# Patient Record
Sex: Male | Born: 1960 | Race: White | Hispanic: No | Marital: Married | Smoking: Former smoker
Health system: Southern US, Community
[De-identification: ages and names within clinical notes are randomized; demographics above are authoritative.]

## PROBLEM LIST (undated history)

## (undated) DIAGNOSIS — M1990 Unspecified osteoarthritis, unspecified site: Secondary | ICD-10-CM

## (undated) DIAGNOSIS — T7840XA Allergy, unspecified, initial encounter: Secondary | ICD-10-CM

## (undated) DIAGNOSIS — E559 Vitamin D deficiency, unspecified: Secondary | ICD-10-CM

## (undated) DIAGNOSIS — F32A Depression, unspecified: Secondary | ICD-10-CM

## (undated) DIAGNOSIS — E291 Testicular hypofunction: Secondary | ICD-10-CM

## (undated) DIAGNOSIS — C4491 Basal cell carcinoma of skin, unspecified: Secondary | ICD-10-CM

## (undated) DIAGNOSIS — J45909 Unspecified asthma, uncomplicated: Secondary | ICD-10-CM

## (undated) DIAGNOSIS — M316 Other giant cell arteritis: Secondary | ICD-10-CM

## (undated) DIAGNOSIS — F329 Major depressive disorder, single episode, unspecified: Secondary | ICD-10-CM

## (undated) DIAGNOSIS — E785 Hyperlipidemia, unspecified: Secondary | ICD-10-CM

## (undated) DIAGNOSIS — F419 Anxiety disorder, unspecified: Secondary | ICD-10-CM

## (undated) DIAGNOSIS — I10 Essential (primary) hypertension: Secondary | ICD-10-CM

## (undated) DIAGNOSIS — N529 Male erectile dysfunction, unspecified: Secondary | ICD-10-CM

## (undated) DIAGNOSIS — K219 Gastro-esophageal reflux disease without esophagitis: Secondary | ICD-10-CM

## (undated) HISTORY — PX: ORTHOPEDIC SURGERY: SHX850

## (undated) HISTORY — DX: Vitamin D deficiency, unspecified: E55.9

## (undated) HISTORY — DX: Essential (primary) hypertension: I10

## (undated) HISTORY — DX: Major depressive disorder, single episode, unspecified: F32.9

## (undated) HISTORY — DX: Unspecified asthma, uncomplicated: J45.909

## (undated) HISTORY — PX: OTHER SURGICAL HISTORY: SHX169

## (undated) HISTORY — DX: Depression, unspecified: F32.A

## (undated) HISTORY — DX: Anxiety disorder, unspecified: F41.9

## (undated) HISTORY — DX: Unspecified osteoarthritis, unspecified site: M19.90

## (undated) HISTORY — DX: Allergy, unspecified, initial encounter: T78.40XA

## (undated) HISTORY — DX: Basal cell carcinoma of skin, unspecified: C44.91

## (undated) HISTORY — DX: Testicular hypofunction: E29.1

## (undated) HISTORY — DX: Gastro-esophageal reflux disease without esophagitis: K21.9

## (undated) HISTORY — PX: LASIK: SHX215

## (undated) HISTORY — DX: Other giant cell arteritis: M31.6

## (undated) HISTORY — PX: HERNIA REPAIR: SHX51

## (undated) HISTORY — DX: Hyperlipidemia, unspecified: E78.5

## (undated) HISTORY — DX: Male erectile dysfunction, unspecified: N52.9

---
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
## 2002-10-14 ENCOUNTER — Encounter: Payer: Self-pay | Admitting: Family Medicine

## 2002-10-14 ENCOUNTER — Encounter: Admission: RE | Admit: 2002-10-14 | Discharge: 2002-10-14 | Payer: Self-pay | Admitting: Family Medicine

## 2007-01-11 ENCOUNTER — Encounter: Admission: RE | Admit: 2007-01-11 | Discharge: 2007-01-11 | Payer: Self-pay | Admitting: Emergency Medicine

## 2007-01-11 IMAGING — US TESTIS
1 series · 14 of 16 positions shown · non-contrast
Comparison: None.

CLINICAL DATA: Left testicular pain for three days.
 SCROTAL ULTRASOUND:
 DOPPLER ULTRASOUND OF THE TESTICLES:
TECHNIQUE: Complete ultrasound examination of the testicles, epididymis, and other scrotal structures was performed.  Color and duplex Doppler ultrasound was utilized to evaluate blood flow to the testicles.

[Series 1: testis · 0.08mm/px · 14 of 56 slices shown]
[im 1/56]
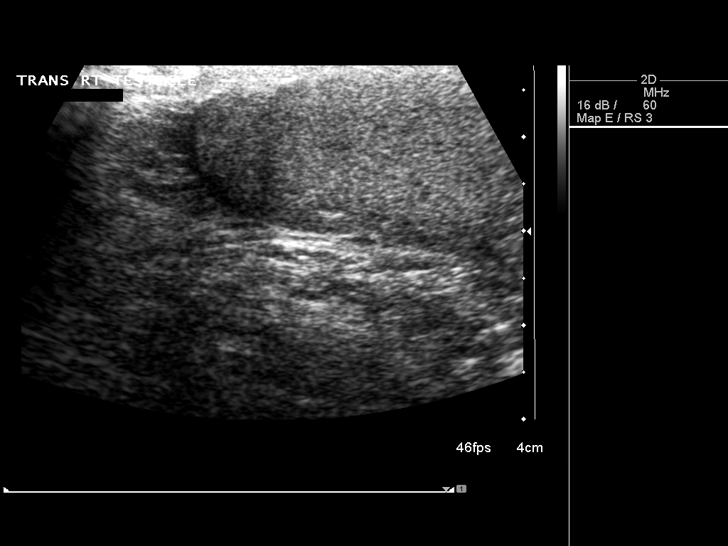
[im 4/56]
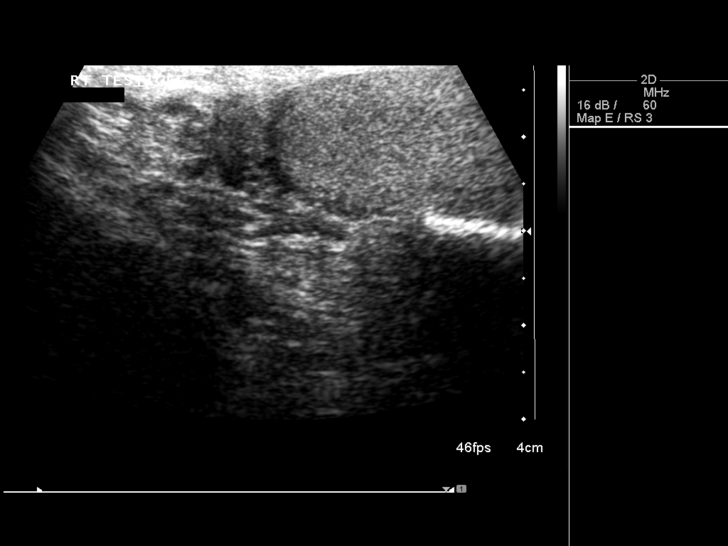
[im 8/56]
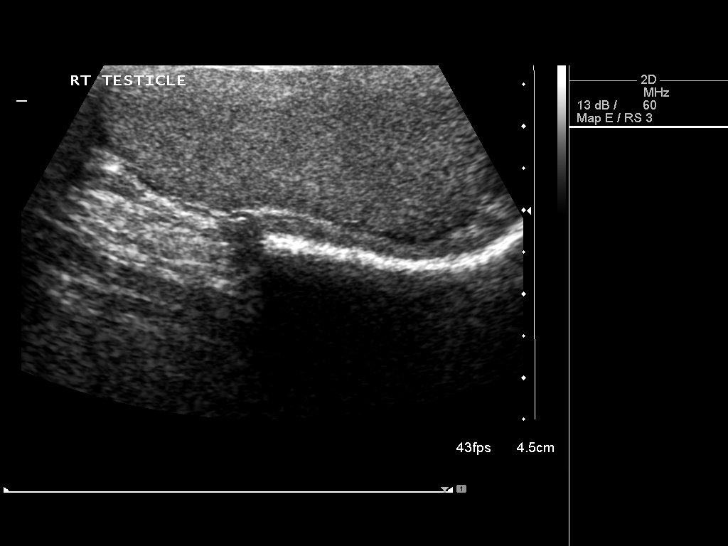
[im 15/56]
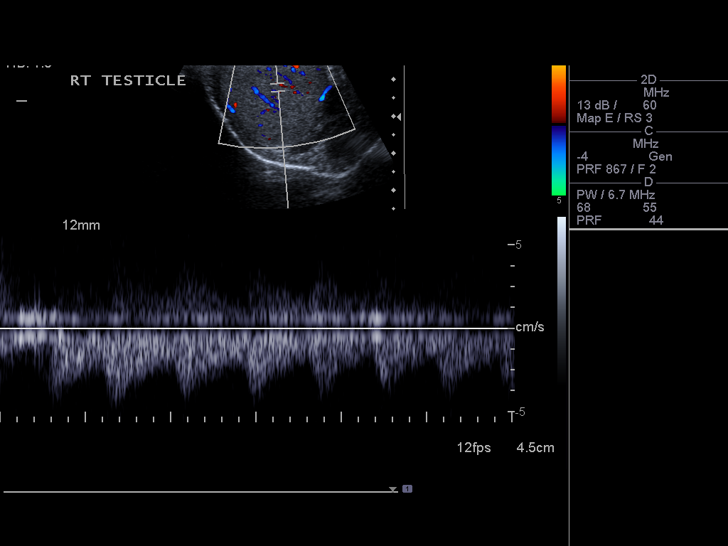
[im 19/56]
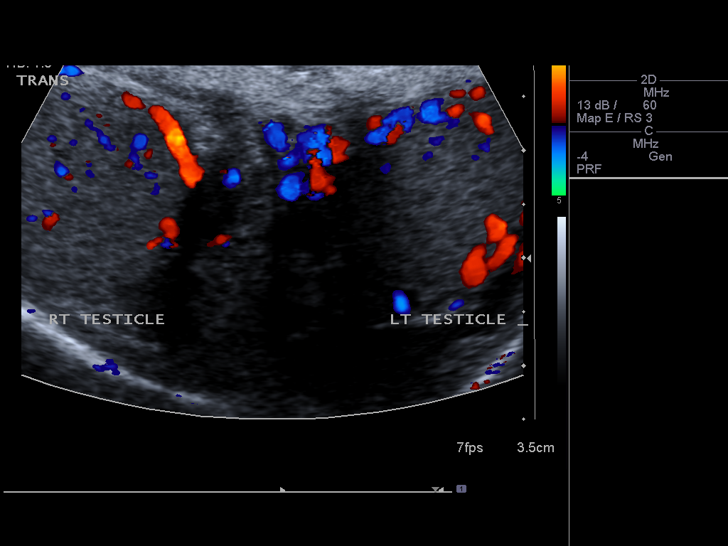
[im 23/56]
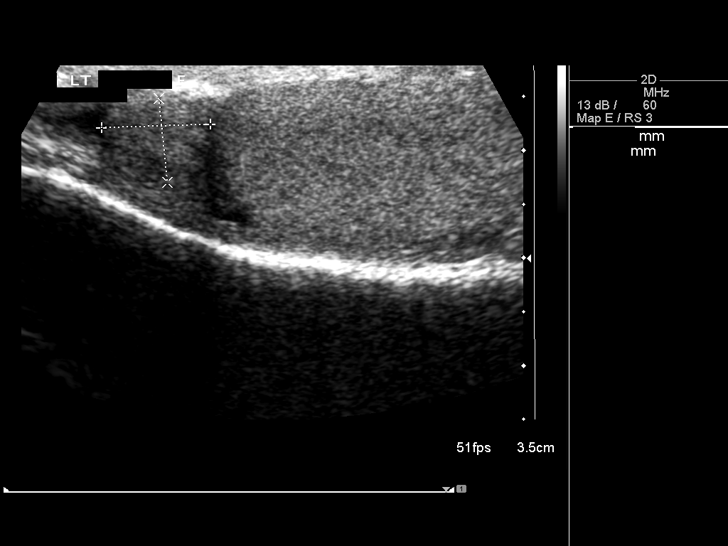
[im 26/56]
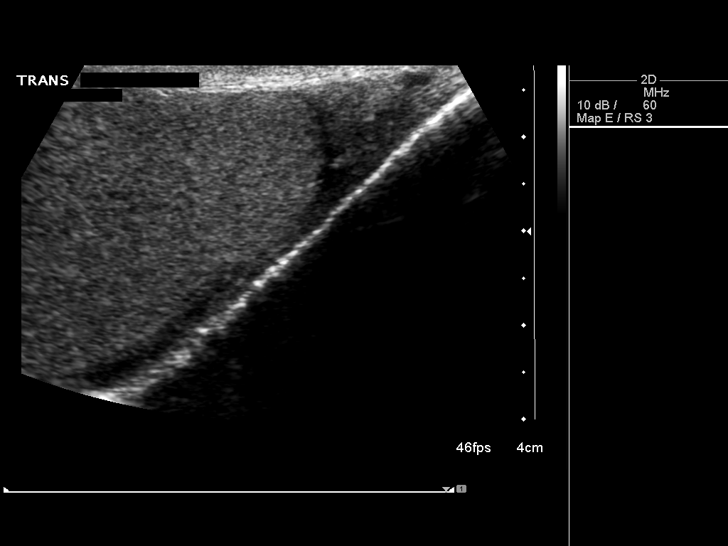
[im 30/56]
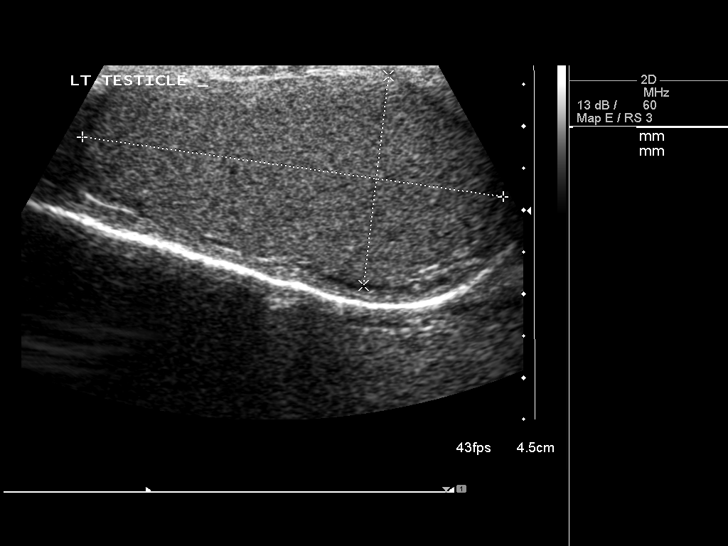
[im 34/56]
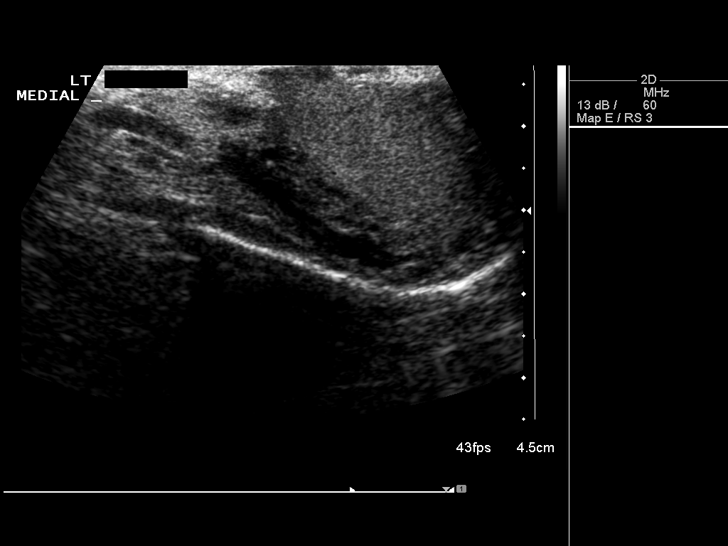
[im 37/56]
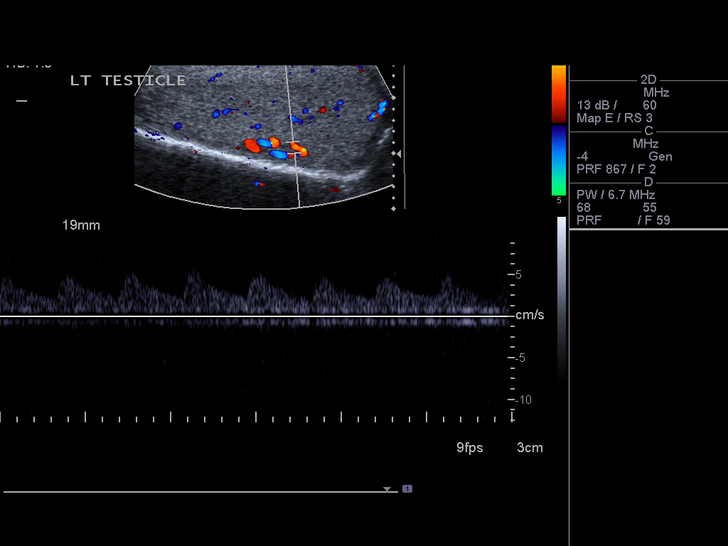
[im 45/56]
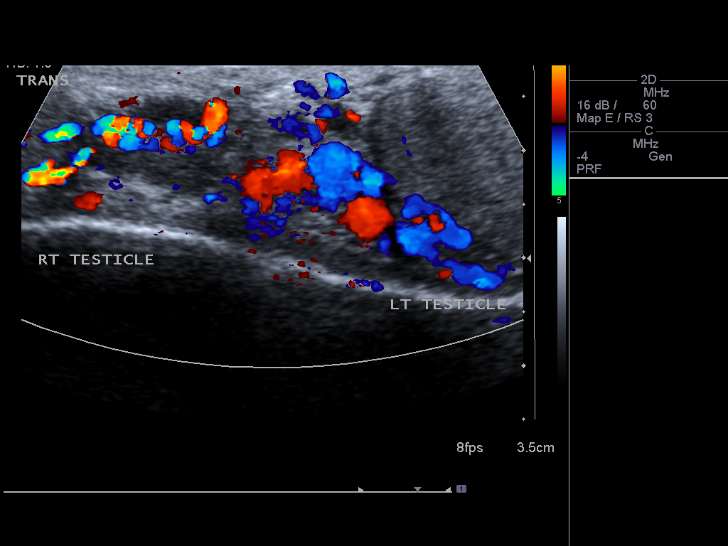
[im 48/56]
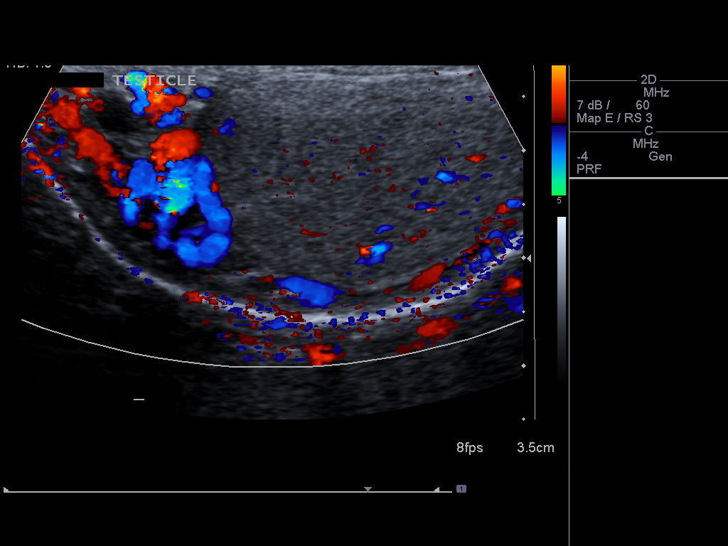
[im 52/56]
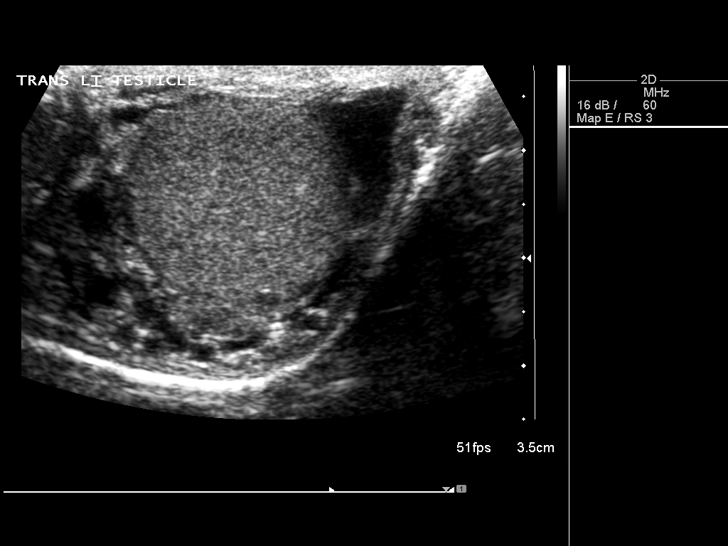
[im 56/56]
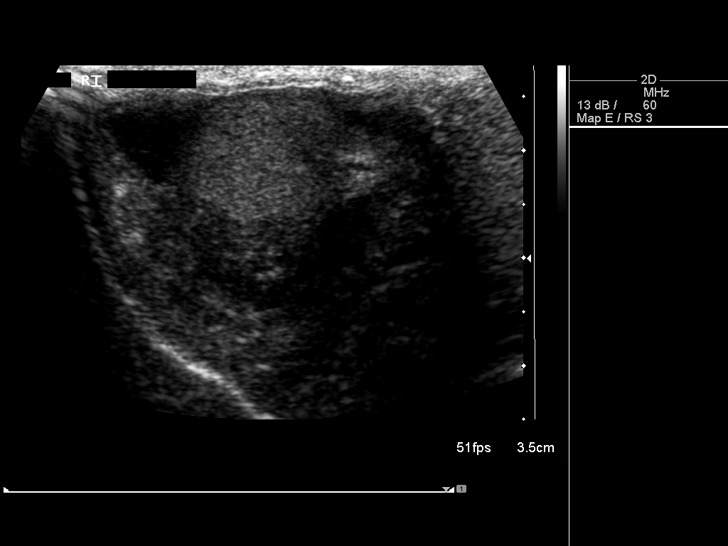

[14 of 16 positions shown; findings below may reference images not displayed]

FINDINGS: Right testicle 4.6 x 2.2 x 3.2 cm.  Left testicle 5.1 x 2.5 x 3.5 cm.  No focal testicular masses. 
 Color flow Doppler and arterial and venous waveforms are symmetrical.  
 No evidence for epididymitis or epididymal masses.  There is a small amount of bilateral testicular fluid.  
 There is a prominent varicocele on the left.  I would question if this might be causing the testicular pain.
IMPRESSION: 1.  Prominent left varicocele.
 2.  Small amount of bilateral scrotal fluid. 
 3.  Testicles and epididymi normal.

## 2007-01-15 ENCOUNTER — Encounter: Admission: RE | Admit: 2007-01-15 | Discharge: 2007-01-15 | Payer: Self-pay | Admitting: Emergency Medicine

## 2010-12-30 ENCOUNTER — Ambulatory Visit (INDEPENDENT_AMBULATORY_CARE_PROVIDER_SITE_OTHER): Payer: PRIVATE HEALTH INSURANCE | Admitting: Sports Medicine

## 2010-12-30 ENCOUNTER — Encounter: Payer: Self-pay | Admitting: Sports Medicine

## 2010-12-30 VITALS — BP 150/93 | HR 91 | Ht 71.5 in | Wt 190.0 lb

## 2010-12-30 DIAGNOSIS — M7740 Metatarsalgia, unspecified foot: Secondary | ICD-10-CM

## 2010-12-30 DIAGNOSIS — M948X9 Other specified disorders of cartilage, unspecified sites: Secondary | ICD-10-CM

## 2010-12-30 DIAGNOSIS — M7750 Other enthesopathy of unspecified foot: Secondary | ICD-10-CM

## 2010-12-30 DIAGNOSIS — M79609 Pain in unspecified limb: Secondary | ICD-10-CM

## 2010-12-30 DIAGNOSIS — M79673 Pain in unspecified foot: Secondary | ICD-10-CM

## 2010-12-30 DIAGNOSIS — M2580 Other specified joint disorders, unspecified joint: Secondary | ICD-10-CM

## 2010-12-30 NOTE — Progress Notes (Signed)
  Subjective:    Patient ID: Stephen Campbell, male    DOB: Dec 17, 1960, 50 y.o.   MRN: [id number]  HPI  Pt presents to clinic for evaluation of rt foot itching at arch x 5-6 years ago.  Also has bilat foot pain x 1.5 years- Rt foot pain at 1st MT and Lt foot pain at 2nd toe.  Pain is worse with running.  Got orthotics made by [employer] which have been helpful.  Had decreased running due to foot pain until the past month- has been able to run and walk without significant pain.  Does not take any meds for the pain.  Works as a Education officer, community.       Wife is a Adult nurse. She helped in place and metatarsal pads on his insoles and these have decreased the pain under his first toes. The orthotics that he originally had made have a hard steel plate onto the forefoot and there are no longer comfortable   Review of Systems     Objective:   Physical Exam No acute distress    Morton's foot bilat Moderate pronation bilat with medial deviation of great toe Rear foot is neutral bilat Good post tib function, normal calf definition bilat Leg lengths equal FABER neg bilat Good hip abduction strength good bilat Good hip flexion strength bilat Rt foot exam: Slight tenderness in tarsal tunnel No hallux rigidus or abnormal callusing Tenderness over sesamoids Lt foot exam: Tenderness under sesamoids on lt  MSK ultrasound Sesamoids are visualized under the right great toe and show no abnormal swelling or sign of stress fracture Sesamoids under the left great toe are also unremarkable MTP joints on the left second and third are unremarkable there is no sign of abnormality in the metatarsal shafts  Right medial ankle shows a normal posterior tibialis tendon and normal flexor digitorum tendon There is a small spur on the posterior aspect of the right medial malleolus that has a little hypoechoic change surrounding There is some slight increase in fluid in the inferior tarsal tunnel around the  posterior tibialis tendon   Assessment & Plan:

## 2010-12-30 NOTE — Assessment & Plan Note (Signed)
B. possible tarsal tunnel involvement I suggested starting vitamin B6 100 mg daily Begin some toe ups on the step with inversion torque the posterior tibialis tendon Use good arch support in shoes and today he is given a sports insole that has some arch build up  If this works he should continue it if not we could recheck this in 6-8 weeks

## 2010-12-30 NOTE — Assessment & Plan Note (Signed)
I think this was triggered by the loss of his transverse arch Metatarsal pads should help with this However the metal plates in his old orthotics made gradually started aggravating this as well

## 2010-12-30 NOTE — Assessment & Plan Note (Signed)
Metatarsal pads are noted to both insoles These are comfortable with walking and jogging gait His running form is neutral with a mid foot strike

## 2011-06-17 HISTORY — PX: VASECTOMY: SHX75

## 2012-01-06 ENCOUNTER — Ambulatory Visit (INDEPENDENT_AMBULATORY_CARE_PROVIDER_SITE_OTHER): Payer: 59 | Admitting: Sports Medicine

## 2012-01-06 ENCOUNTER — Encounter: Payer: Self-pay | Admitting: Sports Medicine

## 2012-01-06 VITALS — BP 144/93

## 2012-01-06 DIAGNOSIS — M722 Plantar fascial fibromatosis: Secondary | ICD-10-CM

## 2012-01-06 DIAGNOSIS — M79673 Pain in unspecified foot: Secondary | ICD-10-CM

## 2012-01-06 DIAGNOSIS — M79609 Pain in unspecified limb: Secondary | ICD-10-CM

## 2012-01-06 NOTE — Progress Notes (Signed)
  Subjective:    Patient ID: Stephen Campbell, male    DOB: 09/13/1960, 51 y.o.   MRN: [id number]  HPI 51 yo presents for new onset L heel pain. Has been going on about 2 months now. Prior to onset of pain had worked back up to 5-10 miles per week of walking / running. Pain started in mid plantar surface initially but quickly localized to the plantar heel and more specifically the lateral plantar heel. Has been trying supportive shoes with heel inserts without significant improvement. Has also taken off from walking / running for about 2 months with no improvement. Doing some stretching, mostly for calf.  Also f/u for sesamoiditis and metatarsalgia, seen about a year ago. Completely resolved with insoles and metatarsal inserts.  Review of Systems Per HPI.    Objective:   Physical Exam R ankle/foot: Normal ROM and strength. No ligamentous laxity. No TTP. L ankle/foot: Normal ROM and strength. TTP lateral posterior heel. No pain with percussion. Some with calcaneal squeeze.some TTP at insertion of PF medially  In clinic u/s: Plantar fascia thickened in heel area to approx 0.6 cm as compared with achilles at approx 0.44 cm; no evidence of stress reaction in the calcaneus.    Assessment & Plan:  1. Plantar fasciitis.  --Given arch support to wear with activity. Increased heel height and offloaded lateral heel with insole. --Stretching and exercise program given and discussed. --Ice at end of day. --F/u 2 months if no improvement, expected course discussed, ease back into activity.

## 2012-01-06 NOTE — Assessment & Plan Note (Signed)
Has some atypical sxs but [place] is very consistent  We will begin standard stretches and exercises  Use heel support  Try arch strap  Reck 2 mos or so

## 2013-06-02 ENCOUNTER — Encounter: Payer: Self-pay | Admitting: Internal Medicine

## 2013-06-02 DIAGNOSIS — F32A Depression, unspecified: Secondary | ICD-10-CM | POA: Insufficient documentation

## 2013-06-02 DIAGNOSIS — J45909 Unspecified asthma, uncomplicated: Secondary | ICD-10-CM | POA: Insufficient documentation

## 2013-06-02 DIAGNOSIS — F329 Major depressive disorder, single episode, unspecified: Secondary | ICD-10-CM | POA: Insufficient documentation

## 2013-06-02 DIAGNOSIS — K219 Gastro-esophageal reflux disease without esophagitis: Secondary | ICD-10-CM | POA: Insufficient documentation

## 2013-06-02 DIAGNOSIS — N529 Male erectile dysfunction, unspecified: Secondary | ICD-10-CM | POA: Insufficient documentation

## 2013-06-02 DIAGNOSIS — T7840XA Allergy, unspecified, initial encounter: Secondary | ICD-10-CM | POA: Insufficient documentation

## 2013-06-03 ENCOUNTER — Encounter: Payer: Self-pay | Admitting: Physician Assistant

## 2013-06-03 ENCOUNTER — Ambulatory Visit (INDEPENDENT_AMBULATORY_CARE_PROVIDER_SITE_OTHER): Payer: 59 | Admitting: Physician Assistant

## 2013-06-03 VITALS — BP 138/88 | HR 80 | Temp 98.2°F | Resp 16 | Ht 72.5 in | Wt 196.0 lb

## 2013-06-03 DIAGNOSIS — J019 Acute sinusitis, unspecified: Secondary | ICD-10-CM

## 2013-06-03 MED ORDER — PREDNISONE 20 MG PO TABS: ORAL_TABLET | ORAL | Status: DC

## 2013-06-03 MED ORDER — AZITHROMYCIN 250 MG PO TABS: ORAL_TABLET | ORAL | Status: DC

## 2013-06-03 NOTE — Progress Notes (Signed)
   Subjective:    Patient ID: Stephen Campbell, male    DOB: [birth year], 52 y.o.   MRN: [id number]  Cough This is a new problem. Episode onset: Had cold in November, felt better, did yard work at [employer] and started for one week.  The problem has been unchanged. Episode frequency: Worse in the evening. The cough is productive of purulent sputum. Associated symptoms include headaches, nasal congestion, postnasal drip and a sore throat. Pertinent negatives include no chest pain, chills, ear congestion, ear pain, fever, heartburn, hemoptysis, myalgias, rash, rhinorrhea, shortness of breath, sweats, weight loss or wheezing. The symptoms are aggravated by dust, pollens and lying down. He has tried OTC cough suppressant and a beta-agonist inhaler for the symptoms. The treatment provided mild relief. His past medical history is significant for asthma.    Review of Systems  Constitutional: Negative.  Negative for fever, chills and weight loss.  HENT: Positive for postnasal drip and sore throat. Negative for ear pain and rhinorrhea.   Respiratory: Positive for cough. Negative for hemoptysis, chest tightness, shortness of breath, wheezing and stridor.   Cardiovascular: Negative for chest pain.  Gastrointestinal: Negative.  Negative for heartburn.  Musculoskeletal: Negative.  Negative for myalgias.  Skin: Negative for rash.  Neurological: Positive for headaches.      Objective:   Physical Exam  Constitutional: He is oriented to person, place, and time. He appears well-developed and well-nourished.  HENT:  Head: Normocephalic and atraumatic.  Right Ear: External ear normal.  Left Ear: External ear normal.  Nose: Right sinus exhibits frontal sinus tenderness. Left sinus exhibits frontal sinus tenderness.  Mouth/Throat: Oropharynx is clear and moist.  Eyes: Conjunctivae and EOM are normal. Pupils are equal, round, and reactive to light.  Neck: Normal range of motion. Neck supple.  Cardiovascular: Normal  rate, regular rhythm and normal heart sounds.   Pulmonary/Chest: Effort normal and breath sounds normal. He has no wheezes.  Abdominal: Soft. Bowel sounds are normal.  Lymphadenopathy:    He has no cervical adenopathy.  Neurological: He is alert and oriented to person, place, and time.      Assessment & Plan:  Acute sinusitis, unspecified - Plan: azithromycin (ZITHROMAX) 250 MG tablet, predniSONE (DELTASONE) 20 MG tablet

## 2013-06-03 NOTE — Patient Instructions (Signed)

## 2013-06-13 ENCOUNTER — Ambulatory Visit (INDEPENDENT_AMBULATORY_CARE_PROVIDER_SITE_OTHER): Payer: 59 | Admitting: Physician Assistant

## 2013-06-13 ENCOUNTER — Encounter: Payer: Self-pay | Admitting: Physician Assistant

## 2013-06-13 VITALS — BP 122/78 | HR 84 | Temp 97.9°F | Resp 16 | Wt 197.0 lb

## 2013-06-13 DIAGNOSIS — L0231 Cutaneous abscess of buttock: Secondary | ICD-10-CM

## 2013-06-13 MED ORDER — SULFAMETHOXAZOLE-TRIMETHOPRIM 400-80 MG PO TABS: 1.0000 | ORAL_TABLET | Freq: Two times a day (BID) | ORAL | Status: AC

## 2013-06-13 NOTE — Patient Instructions (Signed)
Abscess An abscess is an infected area that contains a collection of pus and debris.It can occur in almost any part of the body. An abscess is also known as a furuncle or boil. CAUSES  An abscess occurs when tissue gets infected. This can occur from blockage of oil or sweat glands, infection of hair follicles, or a minor injury to the skin. As the body tries to fight the infection, pus collects in the area and creates pressure under the skin. This pressure causes pain. People with weakened immune systems have difficulty fighting infections and get certain abscesses more often.  SYMPTOMS Usually an abscess develops on the skin and becomes a painful mass that is red, warm, and tender. If the abscess forms under the skin, you may feel a moveable soft area under the skin. Some abscesses break open (rupture) on their own, but most will continue to get worse without care. The infection can spread deeper into the body and eventually into the bloodstream, causing you to feel ill.  DIAGNOSIS  Your caregiver will take your medical history and perform a physical exam. A sample of fluid may also be taken from the abscess to determine what is causing your infection. TREATMENT  Your caregiver may prescribe antibiotic medicines to fight the infection. However, taking antibiotics alone usually does not cure an abscess. Your caregiver may need to make a small cut (incision) in the abscess to drain the pus. In some cases, gauze is packed into the abscess to reduce pain and to continue draining the area. HOME CARE INSTRUCTIONS   Only take over-the-counter or prescription medicines for pain, discomfort, or fever as directed by your caregiver.  If you were prescribed antibiotics, take them as directed. Finish them even if you start to feel better.  If gauze is used, follow your caregiver's directions for changing the gauze.  To avoid spreading the infection:  Keep your draining abscess covered with a  bandage.  Wash your hands well.  Do not share personal care items, towels, or whirlpools with others.  Avoid skin contact with others.  Keep your skin and clothes clean around the abscess.  Keep all follow-up appointments as directed by your caregiver. SEEK MEDICAL CARE IF:   You have increased pain, swelling, redness, fluid drainage, or bleeding.  You have muscle aches, chills, or a general ill feeling.  You have a fever. MAKE SURE YOU:   Understand these instructions.  Will watch your condition.  Will get help right away if you are not doing well or get worse. Document Released: 03/12/2005 Document Revised: 12/02/2011 Document Reviewed: 08/15/2011 [employer] Patient Information 2014 [employer].  

## 2013-06-13 NOTE — Progress Notes (Signed)
   Subjective:    Patient ID: Stephen Campbell, male    DOB: January 15, 1961, 52 y.o.   MRN: [id number]  HPI Patient noticed a bump right posterior thigh on Friday night. It is tender, red, denies discharge, warmth, fever chills, and history of MRSA. Has tried soaking it which made no difference. He states it is unchanged.   Current Outpatient Prescriptions on File Prior to Visit  Medication Sig Dispense Refill  . ALBUTEROL IN Inhale into the lungs. As needed      . aspirin 81 MG chewable tablet Chew by mouth daily.      Marland Kitchen atorvastatin (LIPITOR) 40 MG tablet Take 40 mg by mouth daily. 1/2 tablet M,W,F      . azithromycin (ZITHROMAX) 250 MG tablet Two tablets day one, then one tablet daily next 4 days.  6 tablet  0  . loratadine (CLARITIN) 10 MG tablet Take 10 mg by mouth daily.      . Omega-3 Fatty Acids (FISH OIL PO) Take by mouth daily.      . pantoprazole (PROTONIX) 40 MG tablet Take 40 mg by mouth daily.      . predniSONE (DELTASONE) 20 MG tablet Take one pill two times daily for 3 days, take one pill daily for 4 days.  10 tablet  0   No current facility-administered medications on file prior to visit.   Past Medical History  Diagnosis Date  . Allergy   . Asthma   . Depression   . GERD (gastroesophageal reflux disease)   . ED (erectile dysfunction)     Review of Systems  Constitutional: Negative.   HENT: Negative.   Eyes: Negative.   Respiratory: Negative.   Cardiovascular: Negative.   Gastrointestinal: Negative.   Genitourinary: Negative.   Musculoskeletal: Negative.   Skin: Positive for wound.      Objective:   Physical Exam  Vitals reviewed. Constitutional: He is oriented to person, place, and time. He appears well-developed and well-nourished.  HENT:  Head: Normocephalic and atraumatic.  Right Ear: External ear normal.  Left Ear: External ear normal.  Mouth/Throat: Oropharynx is clear and moist.  Eyes: Conjunctivae and EOM are normal. Pupils are equal, round, and  reactive to light.  Neck: Normal range of motion. Neck supple.  Cardiovascular: Normal rate, regular rhythm and normal heart sounds.   Pulmonary/Chest: Effort normal and breath sounds normal.  Abdominal: Soft. Bowel sounds are normal.  Musculoskeletal: Normal range of motion.  Neurological: He is alert and oriented to person, place, and time. No cranial nerve deficit.  Skin: Skin is warm and dry.  Right inguinal area, 1.5x2cm erythematous tender nonfluctuant cyst, likely infected hair follicle. No warmth or discharge.   Psychiatric: He has a normal mood and affect. His behavior is normal.      Assessment & Plan:  Abscess-  Warm, wet compresses Soaking Bactrim DS BID for 10 days If it is not better or worse follow up.

## 2013-07-08 ENCOUNTER — Other Ambulatory Visit: Payer: Self-pay | Admitting: Physician Assistant

## 2013-07-08 MED ORDER — SULFAMETHOXAZOLE-TMP DS 800-160 MG PO TABS: 1.0000 | ORAL_TABLET | Freq: Two times a day (BID) | ORAL | Status: DC

## 2013-07-11 NOTE — Progress Notes (Signed)
[facility] FOR PT TO CALL & SCHEDULE  

## 2013-07-15 ENCOUNTER — Ambulatory Visit (INDEPENDENT_AMBULATORY_CARE_PROVIDER_SITE_OTHER): Payer: 59 | Admitting: Physician Assistant

## 2013-07-15 ENCOUNTER — Encounter: Payer: Self-pay | Admitting: Physician Assistant

## 2013-07-15 VITALS — BP 132/80 | HR 80 | Temp 97.7°F | Resp 16 | Ht 72.5 in | Wt 194.0 lb

## 2013-07-15 DIAGNOSIS — L0231 Cutaneous abscess of buttock: Secondary | ICD-10-CM

## 2013-07-15 DIAGNOSIS — L03317 Cellulitis of buttock: Principal | ICD-10-CM

## 2013-07-15 MED ORDER — SULFAMETHOXAZOLE-TMP DS 800-160 MG PO TABS: 1.0000 | ORAL_TABLET | Freq: Two times a day (BID) | ORAL | Status: AC

## 2013-07-15 NOTE — Patient Instructions (Addendum)
Start on the bactrim once daily for 5-7 days.  If it does not go away set up an appointment with Dr. Melford Aase for removal.   Abscess An abscess is an infected area that contains a collection of pus and debris.It can occur in almost any part of the body. An abscess is also known as a furuncle or boil. CAUSES  An abscess occurs when tissue gets infected. This can occur from blockage of oil or sweat glands, infection of hair follicles, or a minor injury to the skin. As the body tries to fight the infection, pus collects in the area and creates pressure under the skin. This pressure causes pain. People with weakened immune systems have difficulty fighting infections and get certain abscesses more often.  SYMPTOMS Usually an abscess develops on the skin and becomes a painful mass that is red, warm, and tender. If the abscess forms under the skin, you may feel a moveable soft area under the skin. Some abscesses break open (rupture) on their own, but most will continue to get worse without care. The infection can spread deeper into the body and eventually into the bloodstream, causing you to feel ill.  DIAGNOSIS  Your caregiver will take your medical history and perform a physical exam. A sample of fluid may also be taken from the abscess to determine what is causing your infection. TREATMENT  Your caregiver may prescribe antibiotic medicines to fight the infection. However, taking antibiotics alone usually does not cure an abscess. Your caregiver may need to make a small cut (incision) in the abscess to drain the pus. In some cases, gauze is packed into the abscess to reduce pain and to continue draining the area. HOME CARE INSTRUCTIONS   Only take over-the-counter or prescription medicines for pain, discomfort, or fever as directed by your caregiver.  If you were prescribed antibiotics, take them as directed. Finish them even if you start to feel better.  If gauze is used, follow your caregiver's  directions for changing the gauze.  To avoid spreading the infection:  Keep your draining abscess covered with a bandage.  Wash your hands well.  Do not share personal care items, towels, or whirlpools with others.  Avoid skin contact with others.  Keep your skin and clothes clean around the abscess.  Keep all follow-up appointments as directed by your caregiver. SEEK MEDICAL CARE IF:   You have increased pain, swelling, redness, fluid drainage, or bleeding.  You have muscle aches, chills, or a general ill feeling.  You have a fever. MAKE SURE YOU:   Understand these instructions.  Will watch your condition.  Will get help right away if you are not doing well or get worse. Document Released: 03/12/2005 Document Revised: 12/02/2011 Document Reviewed: 08/15/2011 [facility] Patient Information 2014 [place].

## 2013-07-15 NOTE — Progress Notes (Signed)
   Subjective:    Patient ID: Stephen Campbell, male    DOB: [birth year], 53 y.o.   MRN: [id number]  HPI Patient was seen in the office 06/13/13 for an abscess on his right inguinal area. He was put on ABX and it got better. Then on Tuesday it became tender and red again. Bactrim was again called in for him and he states it is smaller. It is tender, red, denies discharge, warmth, fever chills, and history of MRSA. He continues to do warm compresses, and sitz bathes.   Current Outpatient Prescriptions on File Prior to Visit  Medication Sig Dispense Refill  . ALBUTEROL IN Inhale into the lungs. As needed      . aspirin 81 MG chewable tablet Chew by mouth daily.      Marland Kitchen atorvastatin (LIPITOR) 40 MG tablet Take 40 mg by mouth daily. 1/2 tablet M,W,F      . azithromycin (ZITHROMAX) 250 MG tablet Two tablets day one, then one tablet daily next 4 days.  6 tablet  0  . loratadine (CLARITIN) 10 MG tablet Take 10 mg by mouth daily.      . Omega-3 Fatty Acids (FISH OIL PO) Take by mouth daily.      . pantoprazole (PROTONIX) 40 MG tablet Take 40 mg by mouth daily.      . predniSONE (DELTASONE) 20 MG tablet Take one pill two times daily for 3 days, take one pill daily for 4 days.  10 tablet  0  . sulfamethoxazole-trimethoprim (BACTRIM DS) 800-160 MG per tablet Take 1 tablet by mouth 2 (two) times daily.  20 tablet  0   No current facility-administered medications on file prior to visit.   Past Medical History  Diagnosis Date  . Allergy   . Asthma   . Depression   . GERD (gastroesophageal reflux disease)   . ED (erectile dysfunction)     Review of Systems  Constitutional: Negative.   HENT: Negative.   Eyes: Negative.   Respiratory: Negative.   Cardiovascular: Negative.   Gastrointestinal: Negative.   Genitourinary: Negative.   Musculoskeletal: Negative.   Skin: Positive for wound.      Objective:   Physical Exam  Vitals reviewed. Constitutional: He is oriented to person, place, and time. He  appears well-developed and well-nourished.  HENT:  Head: Normocephalic and atraumatic.  Right Ear: External ear normal.  Left Ear: External ear normal.  Mouth/Throat: Oropharynx is clear and moist.  Eyes: Conjunctivae and EOM are normal. Pupils are equal, round, and reactive to light.  Neck: Normal range of motion. Neck supple.  Cardiovascular: Normal rate, regular rhythm and normal heart sounds.   Pulmonary/Chest: Effort normal and breath sounds normal.  Abdominal: Soft. Bowel sounds are normal.  Musculoskeletal: Normal range of motion.  Neurological: He is alert and oriented to person, place, and time. No cranial nerve deficit.  Skin: Skin is warm and dry.  Right inguinal area, 1.5x1.5cm tender nonfluctuant cyst. No warmth,  erythematous  or discharge.   Psychiatric: He has a normal mood and affect. His behavior is normal.      Assessment & Plan:  Abscess-  Warm, wet compresses Soaking Bactrim DS wil do it once daily and then may need follow up with Dr. Golden Pop for removal.  If it is not better or worse follow up.

## 2013-08-05 ENCOUNTER — Ambulatory Visit (INDEPENDENT_AMBULATORY_CARE_PROVIDER_SITE_OTHER): Payer: 59 | Admitting: Physician Assistant

## 2013-08-05 ENCOUNTER — Encounter: Payer: Self-pay | Admitting: Physician Assistant

## 2013-08-05 VITALS — BP 110/72 | HR 76 | Temp 97.5°F | Resp 16 | Ht 72.5 in | Wt 198.0 lb

## 2013-08-05 DIAGNOSIS — E785 Hyperlipidemia, unspecified: Secondary | ICD-10-CM

## 2013-08-05 DIAGNOSIS — E559 Vitamin D deficiency, unspecified: Secondary | ICD-10-CM

## 2013-08-05 DIAGNOSIS — I10 Essential (primary) hypertension: Secondary | ICD-10-CM

## 2013-08-05 DIAGNOSIS — Z79899 Other long term (current) drug therapy: Secondary | ICD-10-CM

## 2013-08-05 LAB — BASIC METABOLIC PANEL WITH GFR
BUN: 14 mg/dL (ref 6–23)
CO2: 29 mEq/L (ref 19–32)
Calcium: 9.8 mg/dL (ref 8.4–10.5)
Chloride: 102 mEq/L (ref 96–112)
Creat: 0.98 mg/dL (ref 0.50–1.35)
GFR, Est African American: >89 mL/min
Glucose, Bld: 106 mg/dL — ABNORMAL HIGH (ref 70–99)
Potassium: 4.4 mEq/L (ref 3.5–5.3)
Sodium: 139 mEq/L (ref 135–145)

## 2013-08-05 LAB — TSH: TSH: 1.066 u[IU]/mL (ref 0.350–4.500)

## 2013-08-05 LAB — CBC WITH DIFFERENTIAL/PLATELET
BASOS ABS: 0.1 10*3/uL (ref 0.0–0.1)
Basophils Relative: 1 % (ref 0–1)
Eosinophils Absolute: 0.3 10*3/uL (ref 0.0–0.7)
Eosinophils Relative: 5 % (ref 0–5)
HCT: 47 % (ref 39.0–52.0)
HEMOGLOBIN: 16.4 g/dL (ref 13.0–17.0)
Lymphocytes Relative: 27 % (ref 12–46)
Lymphs Abs: 1.5 10*3/uL (ref 0.7–4.0)
MCH: 31.6 pg (ref 26.0–34.0)
MCHC: 34.9 g/dL (ref 30.0–36.0)
MCV: 90.6 fL (ref 78.0–100.0)
MONO ABS: 0.4 10*3/uL (ref 0.1–1.0)
Monocytes Relative: 8 % (ref 3–12)
Neutro Abs: 3.2 10*3/uL (ref 1.7–7.7)
Neutrophils Relative %: 59 % (ref 43–77)
Platelets: 240 10*3/uL (ref 150–400)
RBC: 5.19 MIL/uL (ref 4.22–5.81)
RDW: 13.6 % (ref 11.5–15.5)
WBC: 5.4 10*3/uL (ref 4.0–10.5)

## 2013-08-05 LAB — MAGNESIUM: Magnesium: 1.8 mg/dL (ref 1.5–2.5)

## 2013-08-05 LAB — HEPATIC FUNCTION PANEL
ALT: 61 U/L — ABNORMAL HIGH (ref 0–53)
AST: 33 U/L (ref 0–37)
Albumin: 4.7 g/dL (ref 3.5–5.2)
Alkaline Phosphatase: 54 U/L (ref 39–117)
Bilirubin, Direct: 0.2 mg/dL (ref 0.0–0.3)
Indirect Bilirubin: 0.9 mg/dL (ref 0.2–1.2)
Total Bilirubin: 1.1 mg/dL (ref 0.2–1.2)
Total Protein: 7.7 g/dL (ref 6.0–8.3)

## 2013-08-05 NOTE — Patient Instructions (Signed)
Fat and Cholesterol Control Diet  Fat and cholesterol levels in your blood and organs are influenced by your diet. High levels of fat and cholesterol may lead to diseases of the heart, small and large blood vessels, gallbladder, liver, and pancreas.  CONTROLLING FAT AND CHOLESTEROL WITH DIET  Although exercise and lifestyle factors are important, your diet is key. That is because certain foods are known to raise cholesterol and others to lower it. The goal is to balance foods for their effect on cholesterol and more importantly, to replace saturated and trans fat with other types of fat, such as monounsaturated fat, polyunsaturated fat, and omega-3 fatty acids.  On average, a person should consume no more than 15 to 17 g of saturated fat daily. Saturated and trans fats are considered "bad" fats, and they will raise LDL cholesterol. Saturated fats are primarily found in animal products such as meats, butter, and cream. However, that does not mean you need to give up all your favorite foods. Today, there are good tasting, low-fat, low-cholesterol substitutes for most of the things you like to eat. Choose low-fat or nonfat alternatives. Choose round or loin cuts of red meat. These types of cuts are lowest in fat and cholesterol. Chicken (without the skin), fish, veal, and ground turkey breast are great choices. Eliminate fatty meats, such as hot dogs and salami. Even shellfish have little or no saturated fat. Have a 3 oz (85 g) portion when you eat lean meat, poultry, or fish.  Trans fats are also called "partially hydrogenated oils." They are oils that have been scientifically manipulated so that they are solid at room temperature resulting in a longer shelf life and improved taste and texture of foods in which they are added. Trans fats are found in stick margarine, some tub margarines, cookies, crackers, and baked goods.   When baking and cooking, oils are a great substitute for butter. The monounsaturated oils are  especially beneficial since it is believed they lower LDL and raise HDL. The oils you should avoid entirely are saturated tropical oils, such as coconut and palm.   Remember to eat a lot from food groups that are naturally free of saturated and trans fat, including fish, fruit, vegetables, beans, grains (barley, rice, couscous, bulgur wheat), and pasta (without cream sauces).   IDENTIFYING FOODS THAT LOWER FAT AND CHOLESTEROL   Soluble fiber may lower your cholesterol. This type of fiber is found in fruits such as apples, vegetables such as broccoli, potatoes, and carrots, legumes such as beans, peas, and lentils, and grains such as barley. Foods fortified with plant sterols (phytosterol) may also lower cholesterol. You should eat at least 2 g per day of these foods for a cholesterol lowering effect.   Read package labels to identify low-saturated fats, trans fat free, and low-fat foods at the supermarket. Select cheeses that have only 2 to 3 g saturated fat per ounce. Use a heart-healthy tub margarine that is free of trans fats or partially hydrogenated oil. When buying baked goods (cookies, crackers), avoid partially hydrogenated oils. Breads and muffins should be made from whole grains (whole-wheat or whole oat flour, instead of "flour" or "enriched flour"). Buy non-creamy canned soups with reduced salt and no added fats.   FOOD PREPARATION TECHNIQUES   Never deep-fry. If you must fry, either stir-fry, which uses very little fat, or use non-stick cooking sprays. When possible, broil, bake, or roast meats, and steam vegetables. Instead of putting butter or margarine on vegetables, use lemon   and herbs, applesauce, and cinnamon (for squash and sweet potatoes). Use nonfat yogurt, salsa, and low-fat dressings for salads.   LOW-SATURATED FAT / LOW-FAT FOOD SUBSTITUTES  Meats / Saturated Fat (g)  · Avoid: Steak, marbled (3 oz/85 g) / 11 g  · Choose: Steak, lean (3 oz/85 g) / 4 g  · Avoid: Hamburger (3 oz/85 g) / 7  g  · Choose: Hamburger, lean (3 oz/85 g) / 5 g  · Avoid: Ham (3 oz/85 g) / 6 g  · Choose: Ham, lean cut (3 oz/85 g) / 2.4 g  · Avoid: Chicken, with skin, dark meat (3 oz/85 g) / 4 g  · Choose: Chicken, skin removed, dark meat (3 oz/85 g) / 2 g  · Avoid: Chicken, with skin, light meat (3 oz/85 g) / 2.5 g  · Choose: Chicken, skin removed, light meat (3 oz/85 g) / 1 g  Dairy / Saturated Fat (g)  · Avoid: Whole milk (1 cup) / 5 g  · Choose: Low-fat milk, 2% (1 cup) / 3 g  · Choose: Low-fat milk, 1% (1 cup) / 1.5 g  · Choose: Skim milk (1 cup) / 0.3 g  · Avoid: Hard cheese (1 oz/28 g) / 6 g  · Choose: Skim milk cheese (1 oz/28 g) / 2 to 3 g  · Avoid: Cottage cheese, 4% fat (1 cup) / 6.5 g  · Choose: Low-fat cottage cheese, 1% fat (1 cup) / 1.5 g  · Avoid: Ice cream (1 cup) / 9 g  · Choose: Sherbet (1 cup) / 2.5 g  · Choose: Nonfat frozen yogurt (1 cup) / 0.3 g  · Choose: Frozen fruit bar / trace  · Avoid: Whipped cream (1 tbs) / 3.5 g  · Choose: Nondairy whipped topping (1 tbs) / 1 g  Condiments / Saturated Fat (g)  · Avoid: Mayonnaise (1 tbs) / 2 g  · Choose: Low-fat mayonnaise (1 tbs) / 1 g  · Avoid: Butter (1 tbs) / 7 g  · Choose: Extra light margarine (1 tbs) / 1 g  · Avoid: Coconut oil (1 tbs) / 11.8 g  · Choose: Olive oil (1 tbs) / 1.8 g  · Choose: Corn oil (1 tbs) / 1.7 g  · Choose: Safflower oil (1 tbs) / 1.2 g  · Choose: Sunflower oil (1 tbs) / 1.4 g  · Choose: Soybean oil (1 tbs) / 2.4 g  · Choose: Canola oil (1 tbs) / 1 g  Document Released: 06/02/2005 Document Revised: 09/27/2012 Document Reviewed: 11/21/2010  [employer]® Patient Information ©2014 [employer].

## 2013-08-05 NOTE — Progress Notes (Signed)
HPI 53 y.o. male  presents for 3 month follow up with hypertension, hyperlipidemia, prediabetes and vitamin D. His blood pressure has been controlled at home, today their BP is BP: 110/72 mmHg He denies chest pain, shortness of breath, dizziness.  His cholesterol is diet controlled. In addition they are on lipitor and denies myalgias. His cholesterol is controlled. The cholesterol last visit was:  LDL 94 (141), trigs 274. Patient is on Vitamin D supplement.  On recent ABX for abscess which is better and recent ABX for parotitis treated by his oral surgeon.     Current Medications:  Current Outpatient Prescriptions on File Prior to Visit  Medication Sig Dispense Refill  . ALBUTEROL IN Inhale into the lungs. As needed      . aspirin 81 MG chewable tablet Chew by mouth daily.      Marland Kitchen atorvastatin (LIPITOR) 40 MG tablet Take 40 mg by mouth daily. 1/2 tablet M,W,F      . azithromycin (ZITHROMAX) 250 MG tablet Two tablets day one, then one tablet daily next 4 days.  6 tablet  0  . loratadine (CLARITIN) 10 MG tablet Take 10 mg by mouth daily.      . Omega-3 Fatty Acids (FISH OIL PO) Take by mouth daily.      . pantoprazole (PROTONIX) 40 MG tablet Take 40 mg by mouth daily.      . predniSONE (DELTASONE) 20 MG tablet Take one pill two times daily for 3 days, take one pill daily for 4 days.  10 tablet  0   No current facility-administered medications on file prior to visit.   Medical History:  Past Medical History  Diagnosis Date  . Allergy   . Asthma   . Depression   . GERD (gastroesophageal reflux disease)   . ED (erectile dysfunction)   . Hypertension   . Hyperlipidemia    Allergies: No Known Allergies  ROS Constitutional: Denies fever, chills, headaches, insomnia, fatigue, night sweats Eyes: Denies redness, blurred vision, diplopia, discharge, itchy, watery eyes.  ENT: Denies congestion, post nasal drip, sore throat, earache, dental pain, Tinnitus, Vertigo, Sinus pain, snoring.   Cardio: Denies chest pain, palpitations, irregular heartbeat, dyspnea, diaphoresis, orthopnea, PND, claudication, edema Respiratory: denies cough, shortness of breath, wheezing.  Gastrointestinal: Denies dysphagia, heartburn, AB pain/ cramps, N/V, diarrhea, constipation, hematemesis, melena, hematochezia,  hemorrhoids Genitourinary: Denies dysuria, frequency, urgency, nocturia, hesitancy, discharge, hematuria, flank pain Musculoskeletal: Denies myalgia, stiffness, pain, swelling and strain/sprain. Skin: Denies pruritis, rash, changing in skin lesion Neuro: Denies Weakness, tremor, incoordination, spasms, pain Psychiatric: Denies confusion, memory loss, sensory loss Endocrine: Denies change in weight, skin, hair change, nocturia Diabetic Polys, Denies visual blurring, hyper /hypo glycemic episodes, and paresthesia, Heme/Lymph: Denies Excessive bleeding, bruising, enlarged lymph nodes  Family history- Review and unchanged Social history- Review and unchanged Physical Exam: Filed Vitals:   08/05/13 0856  BP: 110/72  Pulse: 76  Temp: 97.5 F (36.4 C)  Resp: 16   Filed Weights   08/05/13 0856  Weight: 198 lb (89.812 kg)   General Appearance: Well nourished, in no apparent distress. Eyes: PERRLA, EOMs, conjunctiva no swelling or erythema Sinuses: No Frontal/maxillary tenderness ENT/Mouth: Ext aud canals clear, TMs without erythema, bulging. No erythema, swelling, or exudate on post pharynx.  Tonsils not swollen or erythematous. Hearing normal.  Neck: Supple, thyroid normal.  Respiratory: Respiratory effort normal, BS equal bilaterally without rales, rhonchi, wheezing or stridor.  Cardio: RRR with no MRGs. Brisk peripheral pulses without edema.  Abdomen: Soft, +  BS.  Non tender, no guarding, rebound, hernias, masses. Lymphatics: Non tender without lymphadenopathy.  Musculoskeletal: Full ROM, 5/5 strength, normal gait.  Skin: Warm, dry without rashes, lesions, ecchymosis.  Neuro:  Cranial nerves intact. Normal muscle tone, no cerebellar symptoms. Sensation intact.  Psych: Awake and oriented X 3, normal affect, Insight and Judgment appropriate.   Assessment and Plan:  Hypertension: Continue medication, monitor blood pressure at home.  Continue DASH diet. Cholesterol: Continue diet and exercise. Check cholesterol.  Vitamin D Def- check level and continue medications.   Continue diet and meds as discussed. Further disposition pending results of labs.  [name] 9:02 AM

## 2013-08-06 LAB — VITAMIN D 25 HYDROXY (VIT D DEFICIENCY, FRACTURES): Vit D, 25-Hydroxy: 62 ng/mL (ref 30–89)

## 2013-08-08 LAB — NMR LIPOPROFILE WITH LIPIDS
Cholesterol, Total: 189 mg/dL (ref <200)
HDL Particle Number: 37.9 umol/L (ref >=30.5)
HDL Size: 8.5 nm — ABNORMAL LOW (ref >=9.2)
HDL-C: 47 mg/dL (ref >=40)
LDL (calc): 102 mg/dL — ABNORMAL HIGH (ref <100)
LDL Particle Number: 1474 nmol/L — ABNORMAL HIGH (ref <1000)
LDL Size: 20.1 nm — ABNORMAL LOW (ref >20.5)
LP-IR Score: 69 — ABNORMAL HIGH (ref <=45)
Large HDL-P: 2.2 umol/L — ABNORMAL LOW (ref >=4.8)
Large VLDL-P: 5 nmol/L — ABNORMAL HIGH (ref <=2.7)
Small LDL Particle Number: 915 nmol/L — ABNORMAL HIGH (ref <=527)
Triglycerides: 198 mg/dL — ABNORMAL HIGH (ref <150)
VLDL Size: 43.7 nm (ref <=46.6)

## 2013-11-11 ENCOUNTER — Ambulatory Visit (INDEPENDENT_AMBULATORY_CARE_PROVIDER_SITE_OTHER): Payer: 59 | Admitting: Physician Assistant

## 2013-11-11 ENCOUNTER — Encounter: Payer: Self-pay | Admitting: Physician Assistant

## 2013-11-11 VITALS — BP 110/78 | HR 68 | Temp 97.5°F | Resp 16 | Ht 72.5 in | Wt 189.0 lb

## 2013-11-11 DIAGNOSIS — K219 Gastro-esophageal reflux disease without esophagitis: Secondary | ICD-10-CM

## 2013-11-11 DIAGNOSIS — F329 Major depressive disorder, single episode, unspecified: Secondary | ICD-10-CM

## 2013-11-11 DIAGNOSIS — E559 Vitamin D deficiency, unspecified: Secondary | ICD-10-CM

## 2013-11-11 DIAGNOSIS — Z79899 Other long term (current) drug therapy: Secondary | ICD-10-CM

## 2013-11-11 DIAGNOSIS — I10 Essential (primary) hypertension: Secondary | ICD-10-CM

## 2013-11-11 DIAGNOSIS — F3289 Other specified depressive episodes: Secondary | ICD-10-CM

## 2013-11-11 DIAGNOSIS — F32A Depression, unspecified: Secondary | ICD-10-CM

## 2013-11-11 DIAGNOSIS — E785 Hyperlipidemia, unspecified: Secondary | ICD-10-CM

## 2013-11-11 DIAGNOSIS — T7840XA Allergy, unspecified, initial encounter: Secondary | ICD-10-CM

## 2013-11-11 LAB — BASIC METABOLIC PANEL WITH GFR
BUN: 16 mg/dL (ref 6–23)
CALCIUM: 9.4 mg/dL (ref 8.4–10.5)
CHLORIDE: 104 meq/L (ref 96–112)
CO2: 28 meq/L (ref 19–32)
Creat: 0.94 mg/dL (ref 0.50–1.35)
GFR, Est African American: >89 mL/min
GFR, Est Non African American: >89 mL/min
GLUCOSE: 104 mg/dL — AB (ref 70–99)
Potassium: 4.4 mEq/L (ref 3.5–5.3)
Sodium: 137 mEq/L (ref 135–145)

## 2013-11-11 LAB — HEPATIC FUNCTION PANEL
ALK PHOS: 38 U/L — AB (ref 39–117)
ALT: 47 U/L (ref 0–53)
AST: 31 U/L (ref 0–37)
Albumin: 4.6 g/dL (ref 3.5–5.2)
BILIRUBIN TOTAL: 1.3 mg/dL — AB (ref 0.2–1.2)
Bilirubin, Direct: 0.3 mg/dL (ref 0.0–0.3)
Indirect Bilirubin: 1 mg/dL (ref 0.2–1.2)
Total Protein: 6.9 g/dL (ref 6.0–8.3)

## 2013-11-11 LAB — CBC WITH DIFFERENTIAL/PLATELET
Basophils Absolute: 0 10*3/uL (ref 0.0–0.1)
Basophils Relative: 1 % (ref 0–1)
EOS ABS: 0.2 10*3/uL (ref 0.0–0.7)
Eosinophils Relative: 4 % (ref 0–5)
HCT: 45.2 % (ref 39.0–52.0)
HEMOGLOBIN: 15.8 g/dL (ref 13.0–17.0)
LYMPHS ABS: 1.5 10*3/uL (ref 0.7–4.0)
LYMPHS PCT: 30 % (ref 12–46)
MCH: 31.9 pg (ref 26.0–34.0)
MCHC: 35 g/dL (ref 30.0–36.0)
MCV: 91.1 fL (ref 78.0–100.0)
MONOS PCT: 8 % (ref 3–12)
Monocytes Absolute: 0.4 10*3/uL (ref 0.1–1.0)
Neutro Abs: 2.8 10*3/uL (ref 1.7–7.7)
Neutrophils Relative %: 57 % (ref 43–77)
Platelets: 201 10*3/uL (ref 150–400)
RBC: 4.96 MIL/uL (ref 4.22–5.81)
RDW: 13.1 % (ref 11.5–15.5)
WBC: 4.9 10*3/uL (ref 4.0–10.5)

## 2013-11-11 LAB — MAGNESIUM: MAGNESIUM: 1.8 mg/dL (ref 1.5–2.5)

## 2013-11-11 MED ORDER — ALPRAZOLAM 0.5 MG PO TABS: 0.5000 mg | ORAL_TABLET | Freq: Two times a day (BID) | ORAL | Status: DC | PRN

## 2013-11-11 NOTE — Progress Notes (Signed)
Assessment and Plan:  Hypertension: Continue medication, monitor blood pressure at home.  Continue DASH diet. Cholesterol: Continue diet and exercise. Check NMR testing, with strong family history, last time tested and increased lipitor.  Pre-diabetes-Continue diet and exercise. Check A1C Vitamin D Def- check level and continue medications.   Continue diet and meds as discussed. Further disposition pending results of labs.  HPI 53 y.o. male  presents for 3 month follow up with hypertension, hyperlipidemia, prediabetes and vitamin D. His blood pressure has been controlled at home, today their BP is BP: 110/78 mmHg He does workout, got a new dog lab mix. He denies chest pain, shortness of breath, dizziness.  He is on cholesterol medication, takes a full dose of Lipitor 3 days a week and denies myalgias. His cholesterol is at goal. The cholesterol last visit was:   Lab Results  Component Value Date   LDLCALC 102* 08/05/2013   TRIG 198* 08/05/2013   Last A1C in the office was: 5.5 Patient is on Vitamin D supplement.   Got a new dog, some increasing stress at work, rarely uses xanax but likes to have it at the house and he has ran out.   Current Medications:  Current Outpatient Prescriptions on File Prior to Visit  Medication Sig Dispense Refill  . ALBUTEROL IN Inhale into the lungs. As needed      . aspirin 81 MG chewable tablet Chew by mouth daily.      Marland Kitchen atorvastatin (LIPITOR) 40 MG tablet Take 40 mg by mouth daily. 1/2 tablet M,W,F      . loratadine (CLARITIN) 10 MG tablet Take 10 mg by mouth daily.      . Omega-3 Fatty Acids (FISH OIL PO) Take by mouth daily.      . pantoprazole (PROTONIX) 40 MG tablet Take 40 mg by mouth daily.       No current facility-administered medications on file prior to visit.   Medical History:  Past Medical History  Diagnosis Date  . Allergy   . Asthma   . Depression   . GERD (gastroesophageal reflux disease)   . ED (erectile dysfunction)   .  Hypertension   . Hyperlipidemia    Allergies: No Known Allergies   Review of Systems: [X]  = complains of  [ ]  = denies  General: Fatigue [ ]  Fever [ ]  Chills [ ]  Weakness [ ]   Insomnia [ ]  Eyes: Redness [ ]  Blurred vision [ ]  Diplopia [ ]   ENT: Congestion [ ]  Sinus Pain [ ]  Post Nasal Drip [ ]  Sore Throat [ ]  Earache [ ]   Cardiac: Chest pain/pressure [ ]  SOB [ ]  Orthopnea [ ]   Palpitations [ ]   Paroxysmal nocturnal dyspnea[ ]  Claudication [ ]  Edema [ ]   Pulmonary: Cough [ ]  Wheezing[ ]   SOB [ ]   Snoring [ ]   GI: Nausea [ ]  Vomiting[ ]  Dysphagia[ ]  Heartburn[ ]  Abdominal pain [ ]  Constipation [ ] ; Diarrhea [ ] ; BRBPR [ ]  Melena[ ]  GU: Hematuria[ ]  Dysuria [ ]  Nocturia[ ]  Urgency [ ]   Hesitancy [ ]  Discharge [ ]  Neuro: Headaches[ ]  Vertigo[ ]  Paresthesias[ ]  Spasm [ ]  Speech changes [ ]  Incoordination [ ]   Ortho: Arthritis [ ]  Joint pain [ ]  Muscle pain [ ]  Joint swelling [ ]  Back Pain [ ]  Skin:  Rash [ ]   Pruritis [ ]  Change in skin lesion [ ]   Psych: Depression[ ]  Anxiety[ ]  Confusion [ ]  Memory loss [ ]   Heme/Lypmh:  Bleeding [ ]  Bruising [ ]  Enlarged lymph nodes [ ]   Endocrine: Visual blurring [ ]  Paresthesia [ ]  Polyuria [ ]  Polydypsea [ ]    Heat/cold intolerance [ ]  Hypoglycemia [ ]   Family history- Review and unchanged Social history- Review and unchanged Physical Exam: BP 110/78  Pulse 68  Temp(Src) 97.5 F (36.4 C)  Resp 16  Ht 6' 0.5" (1.842 m)  Wt 189 lb (85.73 kg)  BMI 25.27 kg/m2 Wt Readings from Last 3 Encounters:  11/11/13 189 lb (85.73 kg)  08/05/13 198 lb (89.812 kg)  07/15/13 194 lb (87.998 kg)   General Appearance: Well nourished, in no apparent distress. Eyes: PERRLA, EOMs, conjunctiva no swelling or erythema Sinuses: No Frontal/maxillary tenderness ENT/Mouth: Ext aud canals clear, TMs without erythema, bulging. No erythema, swelling, or exudate on post pharynx.  Tonsils not swollen or erythematous. Hearing normal.  Neck: Supple, thyroid normal.   Respiratory: Respiratory effort normal, BS equal bilaterally without rales, rhonchi, wheezing or stridor.  Cardio: RRR with no MRGs. Brisk peripheral pulses without edema.  Abdomen: Soft, + BS.  Non tender, no guarding, rebound, hernias, masses. Lymphatics: Non tender without lymphadenopathy.  Musculoskeletal: Full ROM, 5/5 strength, normal gait.  Skin: Warm, dry without rashes, lesions, ecchymosis.  Neuro: Cranial nerves intact. Normal muscle tone, no cerebellar symptoms. Sensation intact.  Psych: Awake and oriented X 3, normal affect, Insight and Judgment appropriate.    [name] 8:54 AM

## 2013-11-11 NOTE — Patient Instructions (Signed)

## 2013-11-12 LAB — VITAMIN D 25 HYDROXY (VIT D DEFICIENCY, FRACTURES): VIT D 25 HYDROXY: 60 ng/mL (ref 30–89)

## 2013-11-12 LAB — TSH: TSH: 1.472 u[IU]/mL (ref 0.350–4.500)

## 2013-11-14 LAB — NMR LIPOPROFILE WITH LIPIDS
Cholesterol, Total: 156 mg/dL (ref <200)
HDL Particle Number: 33.8 umol/L (ref >=30.5)
HDL SIZE: 8.6 nm — AB (ref >=9.2)
HDL-C: 48 mg/dL (ref >=40)
LARGE HDL: 2.5 umol/L — AB (ref >=4.8)
LARGE VLDL-P: 2.1 nmol/L (ref <=2.7)
LDL (calc): 85 mg/dL (ref <100)
LDL Particle Number: 1134 nmol/L — ABNORMAL HIGH (ref <1000)
LDL SIZE: 20.9 nm (ref >20.5)
LP-IR Score: 59 — ABNORMAL HIGH (ref <=45)
SMALL LDL PARTICLE NUMBER: 622 nmol/L — AB (ref <=527)
Triglycerides: 114 mg/dL (ref <150)
VLDL Size: 41 nm (ref <=46.6)

## 2014-02-03 ENCOUNTER — Encounter: Payer: Self-pay | Admitting: Physician Assistant

## 2014-02-03 ENCOUNTER — Ambulatory Visit (INDEPENDENT_AMBULATORY_CARE_PROVIDER_SITE_OTHER): Payer: 59 | Admitting: Physician Assistant

## 2014-02-03 VITALS — BP 122/78 | HR 72 | Temp 97.9°F | Resp 16 | Ht 72.5 in | Wt 197.0 lb

## 2014-02-03 DIAGNOSIS — Z0000 Encounter for general adult medical examination without abnormal findings: Secondary | ICD-10-CM

## 2014-02-03 DIAGNOSIS — M1990 Unspecified osteoarthritis, unspecified site: Secondary | ICD-10-CM

## 2014-02-03 DIAGNOSIS — E785 Hyperlipidemia, unspecified: Secondary | ICD-10-CM

## 2014-02-03 DIAGNOSIS — IMO0001 Reserved for inherently not codable concepts without codable children: Secondary | ICD-10-CM

## 2014-02-03 DIAGNOSIS — M791 Myalgia, unspecified site: Secondary | ICD-10-CM

## 2014-02-03 DIAGNOSIS — I10 Essential (primary) hypertension: Secondary | ICD-10-CM

## 2014-02-03 DIAGNOSIS — M129 Arthropathy, unspecified: Secondary | ICD-10-CM

## 2014-02-03 DIAGNOSIS — Z125 Encounter for screening for malignant neoplasm of prostate: Secondary | ICD-10-CM

## 2014-02-03 LAB — CBC WITH DIFFERENTIAL/PLATELET
Basophils Absolute: 0.1 10*3/uL (ref 0.0–0.1)
Basophils Relative: 1 % (ref 0–1)
EOS ABS: 0.3 10*3/uL (ref 0.0–0.7)
Eosinophils Relative: 5 % (ref 0–5)
HCT: 43.4 % (ref 39.0–52.0)
Hemoglobin: 15.4 g/dL (ref 13.0–17.0)
LYMPHS ABS: 1.3 10*3/uL (ref 0.7–4.0)
Lymphocytes Relative: 24 % (ref 12–46)
MCH: 31.9 pg (ref 26.0–34.0)
MCHC: 35.5 g/dL (ref 30.0–36.0)
MCV: 89.9 fL (ref 78.0–100.0)
Monocytes Absolute: 0.6 10*3/uL (ref 0.1–1.0)
Monocytes Relative: 10 % (ref 3–12)
Neutro Abs: 3.4 10*3/uL (ref 1.7–7.7)
Neutrophils Relative %: 60 % (ref 43–77)
Platelets: 183 10*3/uL (ref 150–400)
RBC: 4.83 MIL/uL (ref 4.22–5.81)
RDW: 13.2 % (ref 11.5–15.5)
WBC: 5.6 10*3/uL (ref 4.0–10.5)

## 2014-02-03 MED ORDER — TRIAMCINOLONE ACETONIDE 0.1 % EX CREA: 1.0000 "application " | TOPICAL_CREAM | Freq: Two times a day (BID) | CUTANEOUS | Status: DC

## 2014-02-03 NOTE — Progress Notes (Signed)
Complete Physical  Assessment and Plan: Allergic rhinitis- Allegra OTC, increase H20, allergy hygiene explained.  Asthma- controlled  Depression- remission  GERD (gastroesophageal reflux disease)- avoid triggers and continue meds  ED (erectile dysfunction)- take meds PRN  Hypertension-- continue medications, DASH diet, exercise and monitor at home. Call if greater than 130/80.   Hyperlipidemia--continue medications, check lipids, decrease fatty foods, increase activity.   Arthritis- will check ESR, HLA B27  Discussed med's effects and SE's. Screening labs and tests as requested with regular follow-up as recommended.  HPI  His blood pressure has been controlled at home, today their BP is BP: 122/78 mmHg He does workout. He denies chest pain, shortness of breath, dizziness.  He is on cholesterol medication and denies myalgias. His cholesterol is at goal. The cholesterol last visit was:   Lab Results  Component Value Date   LDLCALC 85 11/11/2013   TRIG 114 11/11/2013    Last A1C in the office was: 5.5 Mag 1.8, testosterone 441 (291.72), PSA 0.82.  Patient is on Vitamin D supplement.   Lab Results  Component Value Date   VD25OH 60 11/11/2013     For last week he has had some sinus allergies, denies fever/chills.  He continues to have bilateral feet pain and back pain, his dad has PMR and his brother has ASK, he has had a negative ANA, ANTIDNA and RF in the past.   Current Medications:  Current Outpatient Prescriptions on File Prior to Visit  Medication Sig Dispense Refill  . ALBUTEROL IN Inhale into the lungs. As needed      . ALPRAZolam (XANAX) 0.5 MG tablet Take 1 tablet (0.5 mg total) by mouth 2 (two) times daily as needed for anxiety or sleep.  60 tablet  0  . aspirin 81 MG chewable tablet Chew by mouth daily.      Marland Kitchen atorvastatin (LIPITOR) 40 MG tablet Take 40 mg by mouth daily. Take one  tablet M,W,F      . loratadine (CLARITIN) 10 MG tablet Take 10 mg by mouth daily.      .  Omega-3 Fatty Acids (FISH OIL PO) Take by mouth daily.      . pantoprazole (PROTONIX) 40 MG tablet Take 40 mg by mouth daily.       No current facility-administered medications on file prior to visit.   Health Maintenance:  Immunization History  Administered Date(s) Administered  . Influenza Split 03/28/2013    Tetanus: 2008 Pneumovax: Flu vaccine: 2014 Zostavax: DEXA: Colonoscopy: DUE, wants next year EGD:  Allergies: No Known Allergies Medical History:  Past Medical History  Diagnosis Date  . Allergy   . Asthma   . Depression   . GERD (gastroesophageal reflux disease)   . ED (erectile dysfunction)   . Hypertension   . Hyperlipidemia    Surgical History:  Past Surgical History  Procedure Laterality Date  . Vasectomy  2013  . Orthopedic surgery      left shoulder  . Hernia repair      53 yrs old  . Lasik     Family History:  Family History  Problem Relation Age of Onset  . Heart disease Mother   . Cancer Mother   . Asthma Father   . Heart disease Father   . Spondylitis Brother    Social History:   History  Substance Use Topics  . Smoking status: Current Some Day Smoker    Types: Cigars  . Smokeless tobacco: Never Used  Comment: smokes cigar occasionally  . Alcohol Use: Yes     Comment: daily 1 glass of wine/beer and weekend more   ROS:  [X]  = complains of  [ ]  = denies  General: Fatigue [ ]  Fever [ ]  Chills [ ]  Weakness [ ]   Insomnia [ ]  Weight change [ ]  Night sweats [ ]   Change in appetite [ ]  Eyes: Redness [ ]  Blurred vision [ ]  Diplopia [ ]  Discharge [ ]   ENT: Congestion [ ]  Sinus Pain [ ]  Post Nasal Drip [ ]  Sore Throat [ ]  Earache [ ]  hearing loss [ ]  Tinnitus [ ]  Snoring [ ]   Cardiac: Chest pain/pressure [ ]  SOB [ ]  Orthopnea [ ]   Palpitations [ ]   Paroxysmal nocturnal dyspnea[ ]  Claudication [ ]  Edema [ ]   Pulmonary: Cough [ ]  Wheezing[ ]   SOB [ ]   Pleurisy [ ]   GI: Nausea [ ]  Vomiting[ ]  Dysphagia[ ]  Heartburn[ ]  Abdominal pain [ ]   Constipation [ ] ; Diarrhea [ ]  BRBPR [ ]  Melena[ ]  Bloating [ ]  Hemorrhoids [ ]   GU: Hematuria[ ]  Dysuria [ ]  Nocturia[ ]  Urgency [ ]   Hesitancy [ ]  Discharge [ ]  Frequency [ ]   Neuro: Headaches[ ]  Vertigo[ ]  Paresthesias[ ]  Spasm [ ]  Speech changes [ ]  Incoordination [ ]   Ortho: Arthritis [x ] Joint pain [ ]  Muscle pain [ x] Joint swelling [ ]  Back Pain [ x] Skin:  Rash [ ]   Pruritis [ ]  Change in skin lesion [ ]   Psych: Depression[ ]  Anxiety[ ]  Confusion [ ]  Memory loss [ ]   Heme/Lypmh: Bleeding [ ]  Bruising [ ]  Enlarged lymph nodes [ ]   Endocrine: Visual blurring [ ]  Paresthesia [ ]  Polyuria [ ]  Polydypsea [ ]    Heat/cold intolerance [ ]  Hypoglycemia [ ]   Physical Exam: Estimated body mass index is 26.34 kg/(m^2) as calculated from the following:   Height as of this encounter: 6' 0.5" (1.842 m).   Weight as of this encounter: 197 lb (89.359 kg). BP 122/78  Pulse 72  Temp(Src) 97.9 F (36.6 C)  Resp 16  Ht 6' 0.5" (1.842 m)  Wt 197 lb (89.359 kg)  BMI 26.34 kg/m2 General Appearance: Well nourished, in no apparent distress. Eyes: PERRLA, EOMs, conjunctiva no swelling or erythema, normal fundi and vessels. Sinuses: No Frontal/maxillary tenderness ENT/Mouth: Ext aud canals clear, normal light reflex with TMs without erythema, bulging. Good dentition. No erythema, swelling, or exudate on post pharynx. Tonsils not swollen or erythematous. Hearing normal.  Neck: Supple, thyroid normal. No bruits Respiratory: Respiratory effort normal, BS equal bilaterally without rales, rhonchi, wheezing or stridor. Cardio: RRR without murmurs, rubs or gallops. Brisk peripheral pulses without edema.  Chest: symmetric, with normal excursions and percussion. Abdomen: Soft, +BS. Non tender, no guarding, rebound, hernias, masses, or organomegaly. .  Lymphatics: Non tender without lymphadenopathy.  Genitourinary: defer Musculoskeletal: Full ROM all peripheral extremities,5/5 strength, and normal gait. Skin:  Warm, dry without rashes, lesions, ecchymosis.  Neuro: Cranial nerves intact, reflexes equal bilaterally. Normal muscle tone, no cerebellar symptoms. Sensation intact.  Psych: Awake and oriented X 3, normal affect, Insight and Judgment appropriate.   EKG: WNL, PRWP  no changes. AORTA SCAN: WNL  [name] 10:07 AM

## 2014-02-03 NOTE — Patient Instructions (Signed)
Preventative Care for Adults, Male       REGULAR HEALTH EXAMS:  A routine yearly physical is a good way to check in with your primary care provider about your health and preventive screening. It is also an opportunity to share updates about your health and any concerns you have, and receive a thorough all-over exam.   Most health insurance companies pay for at least some preventative services.  Check with your health plan for specific coverages.  WHAT PREVENTATIVE SERVICES DO MEN NEED?  Adult men should have their weight and blood pressure checked regularly.   Men age 35 and older should have their cholesterol levels checked regularly.  Beginning at age 50 and continuing to age 75, men should be screened for colorectal cancer.  Certain people should may need continued testing until age 85.  Other cancer screening may include exams for testicular and prostate cancer.  Updating vaccinations is part of preventative care.  Vaccinations help protect against diseases such as the flu.  Lab tests are generally done as part of preventative care to screen for anemia and blood disorders, to screen for problems with the kidneys and liver, to screen for bladder problems, to check blood sugar, and to check your cholesterol level.  Preventative services generally include counseling about diet, exercise, avoiding tobacco, drugs, excessive alcohol consumption, and sexually transmitted infections.    GENERAL RECOMMENDATIONS FOR GOOD HEALTH:  Healthy diet:  Eat a variety of foods, including fruit, vegetables, animal or vegetable protein, such as meat, fish, chicken, and eggs, or beans, lentils, tofu, and grains, such as rice.  Drink plenty of water daily.  Decrease saturated fat in the diet, avoid lots of red meat, processed foods, sweets, fast foods, and fried foods.  Exercise:  Aerobic exercise helps maintain good heart health. At least 30-40 minutes of moderate-intensity exercise is recommended.  For example, a brisk walk that increases your heart rate and breathing. This should be done on most days of the week.   Find a type of exercise or a variety of exercises that you enjoy so that it becomes a part of your daily life.  Examples are running, walking, swimming, water aerobics, and biking.  For motivation and support, explore group exercise such as aerobic class, spin class, Zumba, Yoga,or  martial arts, etc.    Set exercise goals for yourself, such as a certain weight goal, walk or run in a race such as a 5k walk/run.  Speak to your primary care provider about exercise goals.  Disease prevention:  If you smoke or chew tobacco, find out from your caregiver how to quit. It can literally save your life, no matter how long you have been a tobacco user. If you do not use tobacco, never begin.   Maintain a healthy diet and normal weight. Increased weight leads to problems with blood pressure and diabetes.   The Body Mass Index or BMI is a way of measuring how much of your body is fat. Having a BMI above 27 increases the risk of heart disease, diabetes, hypertension, stroke and other problems related to obesity. Your caregiver can help determine your BMI and based on it develop an exercise and dietary program to help you achieve or maintain this important measurement at a healthful level.  High blood pressure causes heart and blood vessel problems.  Persistent high blood pressure should be treated with medicine if weight loss and exercise do not work.   Fat and cholesterol leaves deposits in your arteries   that can block them. This causes heart disease and vessel disease elsewhere in your body.  If your cholesterol is found to be high, or if you have heart disease or certain other medical conditions, then you may need to have your cholesterol monitored frequently and be treated with medication.   Ask if you should have a stress test if your history suggests this. A stress test is a test done on  a treadmill that looks for heart disease. This test can find disease prior to there being a problem.  Avoid drinking alcohol in excess (more than two drinks per day).  Avoid use of street drugs. Do not share needles with anyone. Ask for professional help if you need assistance or instructions on stopping the use of alcohol, cigarettes, and/or drugs.  Brush your teeth twice a day with fluoride toothpaste, and floss once a day. Good oral hygiene prevents tooth decay and gum disease. The problems can be painful, unattractive, and can cause other health problems. Visit your dentist for a routine oral and dental check up and preventive care every 6-12 months.   Look at your skin regularly.  Use a mirror to look at your back. Notify your caregivers of changes in moles, especially if there are changes in shapes, colors, a size larger than a pencil eraser, an irregular border, or development of new moles.  Safety:  Use seatbelts 100% of the time, whether driving or as a passenger.  Use safety devices such as hearing protection if you work in environments with loud noise or significant background noise.  Use safety glasses when doing any work that could send debris in to the eyes.  Use a helmet if you ride a bike or motorcycle.  Use appropriate safety gear for contact sports.  Talk to your caregiver about gun safety.  Use sunscreen with a SPF (or skin protection factor) of 15 or greater.  Lighter skinned people are at a greater risk of skin cancer. Don't forget to also wear sunglasses in order to protect your eyes from too much damaging sunlight. Damaging sunlight can accelerate cataract formation.   Practice safe sex. Use condoms. Condoms are used for birth control and to help reduce the spread of sexually transmitted infections (or STIs).  Some of the STIs are gonorrhea (the clap), chlamydia, syphilis, trichomonas, herpes, HPV (human papilloma virus) and HIV (human immunodeficiency virus) which causes AIDS.  The herpes, HIV and HPV are viral illnesses that have no cure. These can result in disability, cancer and death.   Keep carbon monoxide and smoke detectors in your home functioning at all times. Change the batteries every 6 months or use a model that plugs into the wall.   Vaccinations:  Stay up to date with your tetanus shots and other required immunizations. You should have a booster for tetanus every 10 years. Be sure to get your flu shot every year, since 5%-20% of the [place] population comes down with the flu. The flu vaccine changes each year, so being vaccinated once is not enough. Get your shot in the fall, before the flu season peaks.   Other vaccines to consider:  Pneumococcal vaccine to protect against certain types of pneumonia.  This is normally recommended for adults age 65 or older.  However, adults younger than 51 years old with certain underlying conditions such as diabetes, heart or lung disease should also receive the vaccine.  Shingles vaccine to protect against Varicella Zoster if you are older than age 60, or younger   than 51 years old with certain underlying illness.  Hepatitis A vaccine to protect against a form of infection of the liver by a virus acquired from food.  Hepatitis B vaccine to protect against a form of infection of the liver by a virus acquired from blood or body fluids, particularly if you work in health care.  If you plan to travel internationally, check with your local health department for specific vaccination recommendations.  Cancer Screening:  Most routine colon cancer screening begins at the age of 50. On a yearly basis, doctors may provide special easy to use take-home tests to check for hidden blood in the stool. Sigmoidoscopy or colonoscopy can detect the earliest forms of colon cancer and is life saving. These tests use a small camera at the end of a tube to directly examine the colon. Speak to your caregiver about this at age 50, when routine  screening begins (and is repeated every 5 years unless early forms of pre-cancerous polyps or small growths are found).   At the age of 50 men usually start screening for prostate cancer every year. Screening may begin at a younger age for those with higher risk. Those at higher risk include African-Americans or having a family history of prostate cancer. There are two types of tests for prostate cancer:   Prostate-specific antigen (PSA) testing. Recent studies raise questions about prostate cancer using PSA and you should discuss this with your caregiver.   Digital rectal exam (in which your doctor's lubricated and gloved finger feels for enlargement of the prostate through the anus).   Screening for testicular cancer.  Do a monthly exam of your testicles. Gently roll each testicle between your thumb and fingers, feeling for any abnormal lumps. The best time to do this is after a hot shower or bath when the tissues are looser. Notify your caregivers of any lumps, tenderness or changes in size or shape immediately.     

## 2014-02-04 LAB — SEDIMENTATION RATE: Sed Rate: 5 mm/hr (ref 0–16)

## 2014-02-04 LAB — HEPATIC FUNCTION PANEL
ALBUMIN: 4.5 g/dL (ref 3.5–5.2)
ALT: 53 U/L (ref 0–53)
AST: 33 U/L (ref 0–37)
Alkaline Phosphatase: 44 U/L (ref 39–117)
BILIRUBIN TOTAL: 0.8 mg/dL (ref 0.2–1.2)
Bilirubin, Direct: 0.2 mg/dL (ref 0.0–0.3)
Indirect Bilirubin: 0.6 mg/dL (ref 0.2–1.2)
Total Protein: 7.3 g/dL (ref 6.0–8.3)

## 2014-02-04 LAB — CK: Total CK: 100 U/L (ref 7–232)

## 2014-02-04 LAB — HEMOGLOBIN A1C
Hgb A1c MFr Bld: 5.3 % (ref <5.7)
Mean Plasma Glucose: 105 mg/dL (ref <117)

## 2014-02-04 LAB — URINALYSIS, ROUTINE W REFLEX MICROSCOPIC
Bilirubin Urine: NEGATIVE
Glucose, UA: NEGATIVE mg/dL
HGB URINE DIPSTICK: NEGATIVE
KETONES UR: NEGATIVE mg/dL
LEUKOCYTES UA: NEGATIVE
Nitrite: NEGATIVE
PH: 8 (ref 5.0–8.0)
Protein, ur: NEGATIVE mg/dL
Specific Gravity, Urine: 1.016 (ref 1.005–1.030)
UROBILINOGEN UA: 0.2 mg/dL (ref 0.0–1.0)

## 2014-02-04 LAB — MAGNESIUM: Magnesium: 1.7 mg/dL (ref 1.5–2.5)

## 2014-02-04 LAB — IRON AND TIBC
%SAT: 46 % (ref 20–55)
IRON: 125 ug/dL (ref 42–165)
TIBC: 273 ug/dL (ref 215–435)
UIBC: 148 ug/dL (ref 125–400)

## 2014-02-04 LAB — BASIC METABOLIC PANEL WITH GFR
BUN: 15 mg/dL (ref 6–23)
CO2: 28 meq/L (ref 19–32)
Calcium: 9.4 mg/dL (ref 8.4–10.5)
Chloride: 102 mEq/L (ref 96–112)
Creat: 0.91 mg/dL (ref 0.50–1.35)
GFR, Est African American: >89 mL/min
GFR, Est Non African American: >89 mL/min
Glucose, Bld: 103 mg/dL — ABNORMAL HIGH (ref 70–99)
Potassium: 4.9 mEq/L (ref 3.5–5.3)
Sodium: 140 mEq/L (ref 135–145)

## 2014-02-04 LAB — MICROALBUMIN / CREATININE URINE RATIO
Creatinine, Urine: 133.7 mg/dL
Microalb Creat Ratio: 3.7 mg/g (ref 0.0–30.0)
Microalb, Ur: 0.5 mg/dL (ref 0.00–1.89)

## 2014-02-04 LAB — TSH: TSH: 1.049 u[IU]/mL (ref 0.350–4.500)

## 2014-02-04 LAB — INSULIN, FASTING: Insulin fasting, serum: 13 u[IU]/mL (ref 3–28)

## 2014-02-04 LAB — LIPID PANEL
CHOL/HDL RATIO: 3.4 ratio
CHOLESTEROL: 167 mg/dL (ref 0–200)
HDL: 49 mg/dL (ref >39)
LDL Cholesterol: 99 mg/dL (ref 0–99)
Triglycerides: 93 mg/dL (ref <150)
VLDL: 19 mg/dL (ref 0–40)

## 2014-02-04 LAB — FERRITIN: FERRITIN: 288 ng/mL (ref 22–322)

## 2014-02-04 LAB — TESTOSTERONE: Testosterone: 369 ng/dL (ref 300–890)

## 2014-02-04 LAB — VITAMIN B12: Vitamin B-12: 475 pg/mL (ref 211–911)

## 2014-02-04 LAB — URIC ACID: Uric Acid, Serum: 6.1 mg/dL (ref 4.0–7.8)

## 2014-02-04 LAB — PSA: PSA: 1.22 ng/mL (ref <=4.00)

## 2014-02-04 LAB — VITAMIN D 25 HYDROXY (VIT D DEFICIENCY, FRACTURES): VIT D 25 HYDROXY: 54 ng/mL (ref 30–89)

## 2014-02-09 LAB — HLA-B27 ANTIGEN: DNA Result:: DETECTED — AB

## 2014-02-10 NOTE — Addendum Note (Signed)
Addended by: Vladimir Crofts on: 02/10/2014 08:18 AM   Modules accepted: Orders

## 2014-03-30 ENCOUNTER — Other Ambulatory Visit: Payer: Self-pay | Admitting: Internal Medicine

## 2014-03-30 ENCOUNTER — Ambulatory Visit (INDEPENDENT_AMBULATORY_CARE_PROVIDER_SITE_OTHER): Payer: 59 | Admitting: *Deleted

## 2014-03-30 DIAGNOSIS — Z23 Encounter for immunization: Secondary | ICD-10-CM

## 2014-05-26 ENCOUNTER — Other Ambulatory Visit: Payer: Self-pay | Admitting: Physician Assistant

## 2014-08-11 ENCOUNTER — Ambulatory Visit: Payer: Self-pay | Admitting: Physician Assistant

## 2014-08-18 ENCOUNTER — Encounter: Payer: Self-pay | Admitting: Physician Assistant

## 2014-08-18 ENCOUNTER — Ambulatory Visit (INDEPENDENT_AMBULATORY_CARE_PROVIDER_SITE_OTHER): Payer: 59 | Admitting: Physician Assistant

## 2014-08-18 VITALS — BP 120/80 | HR 80 | Temp 97.7°F | Resp 16 | Ht 72.5 in | Wt 196.0 lb

## 2014-08-18 DIAGNOSIS — K2100 Gastro-esophageal reflux disease with esophagitis, without bleeding: Secondary | ICD-10-CM

## 2014-08-18 DIAGNOSIS — Z1211 Encounter for screening for malignant neoplasm of colon: Secondary | ICD-10-CM

## 2014-08-18 DIAGNOSIS — I10 Essential (primary) hypertension: Secondary | ICD-10-CM

## 2014-08-18 DIAGNOSIS — E785 Hyperlipidemia, unspecified: Secondary | ICD-10-CM

## 2014-08-18 DIAGNOSIS — Z79899 Other long term (current) drug therapy: Secondary | ICD-10-CM

## 2014-08-18 DIAGNOSIS — E559 Vitamin D deficiency, unspecified: Secondary | ICD-10-CM

## 2014-08-18 DIAGNOSIS — T7840XS Allergy, unspecified, sequela: Secondary | ICD-10-CM

## 2014-08-18 DIAGNOSIS — E291 Testicular hypofunction: Secondary | ICD-10-CM

## 2014-08-18 DIAGNOSIS — M19019 Primary osteoarthritis, unspecified shoulder: Secondary | ICD-10-CM | POA: Insufficient documentation

## 2014-08-18 DIAGNOSIS — N529 Male erectile dysfunction, unspecified: Secondary | ICD-10-CM

## 2014-08-18 LAB — LIPID PANEL
Cholesterol: 209 mg/dL — ABNORMAL HIGH (ref 0–200)
HDL: 40 mg/dL (ref >=40)
LDL Cholesterol: 123 mg/dL — ABNORMAL HIGH (ref 0–99)
Total CHOL/HDL Ratio: 5.2 Ratio
Triglycerides: 228 mg/dL — ABNORMAL HIGH (ref <150)
VLDL: 46 mg/dL — AB (ref 0–40)

## 2014-08-18 LAB — BASIC METABOLIC PANEL WITH GFR
BUN: 15 mg/dL (ref 6–23)
CALCIUM: 9.7 mg/dL (ref 8.4–10.5)
CO2: 24 mEq/L (ref 19–32)
CREATININE: 0.99 mg/dL (ref 0.50–1.35)
Chloride: 103 mEq/L (ref 96–112)
GFR, Est Non African American: 88 mL/min
Glucose, Bld: 131 mg/dL — ABNORMAL HIGH (ref 70–99)
Potassium: 4.1 mEq/L (ref 3.5–5.3)
SODIUM: 139 meq/L (ref 135–145)

## 2014-08-18 LAB — CBC WITH DIFFERENTIAL/PLATELET
Basophils Absolute: 0.1 10*3/uL (ref 0.0–0.1)
Basophils Relative: 1 % (ref 0–1)
EOS PCT: 3 % (ref 0–5)
Eosinophils Absolute: 0.2 10*3/uL (ref 0.0–0.7)
HCT: 45.8 % (ref 39.0–52.0)
HEMOGLOBIN: 15.7 g/dL (ref 13.0–17.0)
Lymphocytes Relative: 21 % (ref 12–46)
Lymphs Abs: 1.4 10*3/uL (ref 0.7–4.0)
MCH: 31.3 pg (ref 26.0–34.0)
MCHC: 34.3 g/dL (ref 30.0–36.0)
MCV: 91.4 fL (ref 78.0–100.0)
MONO ABS: 0.5 10*3/uL (ref 0.1–1.0)
MPV: 10.9 fL (ref 8.6–12.4)
Monocytes Relative: 8 % (ref 3–12)
Neutro Abs: 4.5 10*3/uL (ref 1.7–7.7)
Neutrophils Relative %: 67 % (ref 43–77)
Platelets: 233 10*3/uL (ref 150–400)
RBC: 5.01 MIL/uL (ref 4.22–5.81)
RDW: 13 % (ref 11.5–15.5)
WBC: 6.7 10*3/uL (ref 4.0–10.5)

## 2014-08-18 LAB — HEPATIC FUNCTION PANEL
ALK PHOS: 48 U/L (ref 39–117)
ALT: 43 U/L (ref 0–53)
AST: 26 U/L (ref 0–37)
Albumin: 4.3 g/dL (ref 3.5–5.2)
BILIRUBIN DIRECT: 0.2 mg/dL (ref 0.0–0.3)
BILIRUBIN INDIRECT: 0.6 mg/dL (ref 0.2–1.2)
Total Bilirubin: 0.8 mg/dL (ref 0.2–1.2)
Total Protein: 7.1 g/dL (ref 6.0–8.3)

## 2014-08-18 LAB — MAGNESIUM: MAGNESIUM: 1.7 mg/dL (ref 1.5–2.5)

## 2014-08-18 MED ORDER — TRIAMCINOLONE ACETONIDE 0.1 % EX CREA: 1.0000 "application " | TOPICAL_CREAM | Freq: Two times a day (BID) | CUTANEOUS | Status: DC

## 2014-08-18 MED ORDER — TESTOSTERONE 20.25 MG/ACT (1.62%) TD GEL: TRANSDERMAL | Status: DC

## 2014-08-18 MED ORDER — MELOXICAM 15 MG PO TABS: ORAL_TABLET | ORAL | Status: DC

## 2014-08-18 NOTE — Progress Notes (Signed)
Assessment and Plan:  Hypertension: Continue medication, monitor blood pressure at home. Continue DASH diet.  Reminder to go to the ER if any CP, SOB, nausea, dizziness, severe HA, changes vision/speech, left arm numbness and tingling, and jaw pain. Cholesterol: Continue diet and exercise. Check cholesterol.  Hypogonadism- continue to monitor, states medication is not helping with symptoms of low T, will try androgel Vitamin D Def- check level and continue medications.  GERD- will try to get off PPiI given info for taper and zantac sent in Needs colonoscopy  Continue diet and meds as discussed. Further disposition pending results of labs.  HPI 54 y.o. male  presents for 3 month follow up with hypertension, hyperlipidemia, prediabetes and vitamin D.  His blood pressure has been controlled at home, today their BP is BP: 120/80 mmHg  He does workout. He denies chest pain, shortness of breath, dizziness.  He is on cholesterol medication and denies myalgias. His cholesterol is at goal. The cholesterol last visit was:  Lab Results  Component Value Date   CHOL 167 02/03/2014   HDL 49 02/03/2014   LDLCALC 99 02/03/2014   TRIG 93 02/03/2014   CHOLHDL 3.4 02/03/2014   Last A1C in the office was:  Lab Results  Component Value Date   HGBA1C 5.3 02/03/2014  Patient is on Vitamin D supplement.   Lab Results  Component Value Date   VD25OH 71 02/03/2014   He has a history of testosterone deficiency and is on testosterone replacement, testim 1 tube a day but states that it is not helping energy, libido and would like to try another gel..  Lab Results  Component Value Date   TESTOSTERONE 369 02/03/2014  Has lower back pain and bilateral feet pain, sent to [place] for possible ASK with + HLA B27, she ruled that out and believes it is more OA. Had injections in feet that helped some. He is on meloxicam/mobic occ that does help.     Current Medications:  Current Outpatient Prescriptions on File  Prior to Visit  Medication Sig Dispense Refill  . ALBUTEROL IN Inhale into the lungs. As needed    . ALPRAZolam (XANAX) 0.5 MG tablet Take 1 tablet (0.5 mg total) by mouth 2 (two) times daily as needed for anxiety or sleep. 60 tablet 0  . aspirin 81 MG chewable tablet Chew by mouth daily.    Marland Kitchen atorvastatin (LIPITOR) 40 MG tablet Take 40 mg by mouth daily. Take one  tablet M,W,F    . loratadine (CLARITIN) 10 MG tablet Take 10 mg by mouth daily.    . Omega-3 Fatty Acids (FISH OIL PO) Take by mouth daily.    . pantoprazole (PROTONIX) 40 MG tablet TAKE 1 TABLET ONCE DAILY. 30 tablet 3  . TESTIM 50 MG/5GM (1%) GEL APPLY ONE TUBE (5GM) ONCE A DAY AS DIRECTED. 150 g 2  . triamcinolone cream (KENALOG) 0.1 % Apply 1 application topically 2 (two) times daily. 45 g 1   No current facility-administered medications on file prior to visit.   Medical History:  Past Medical History  Diagnosis Date  . Allergy   . Asthma   . Depression   . GERD (gastroesophageal reflux disease)   . ED (erectile dysfunction)   . Hypertension   . Hyperlipidemia    Allergies: No Known Allergies   Review of Systems:  Review of Systems  Constitutional: Negative.   HENT: Negative.   Respiratory: Negative.   Cardiovascular: Negative.   Gastrointestinal: Positive for heartburn (depending  on what he eats). Negative for nausea, vomiting, abdominal pain, diarrhea, constipation, blood in stool and melena.  Genitourinary: Negative.   Musculoskeletal: Positive for joint pain. Negative for myalgias, back pain, falls and neck pain.  Skin: Positive for rash. Negative for itching.  Neurological: Negative.   Psychiatric/Behavioral: Negative.     Family history- Review and unchanged Social history- Review and unchanged Physical Exam: BP 120/80 mmHg  Pulse 80  Temp(Src) 97.7 F (36.5 C)  Resp 16  Ht 6' 0.5" (1.842 m)  Wt 196 lb (88.905 kg)  BMI 26.20 kg/m2 Wt Readings from Last 3 Encounters:  08/18/14 196 lb (88.905  kg)  02/03/14 197 lb (89.359 kg)  11/11/13 189 lb (85.73 kg)   General Appearance: Well nourished, in no apparent distress. Eyes: PERRLA, EOMs, conjunctiva no swelling or erythema Sinuses: No Frontal/maxillary tenderness ENT/Mouth: Ext aud canals clear, TMs without erythema, bulging. No erythema, swelling, or exudate on post pharynx.  Tonsils not swollen or erythematous. Hearing normal.  Neck: Supple, thyroid normal.  Respiratory: Respiratory effort normal, BS equal bilaterally without rales, rhonchi, wheezing or stridor.  Cardio: RRR with no MRGs. Brisk peripheral pulses without edema.  Abdomen: Soft, + BS,  Non tender, no guarding, rebound, hernias, masses. Lymphatics: Non tender without lymphadenopathy.  Musculoskeletal: Full ROM, 5/5 strength, Normal gait Skin: Has erythematous scaly rash on lateral left leg. Warm, dry without , lesions, ecchymosis.  Neuro: Cranial nerves intact. Normal muscle tone, no cerebellar symptoms. Psych: Awake and oriented X 3, normal affect, Insight and Judgment appropriate.    [name], PA-C 11:24 AM [facility]

## 2014-08-18 NOTE — Patient Instructions (Addendum)
9 Ways to Naturally Increase Testosterone Levels  1.   Lose Weight If you're overweight, shedding the excess pounds may increase your testosterone levels, according to research presented at the Endocrine Society's 2012 meeting. Overweight men are more likely to have low testosterone levels to begin with, so this is an important trick to increase your body's testosterone production when you need it most.  2.   High-Intensity Exercise like Peak Fitness  Short intense exercise has a proven positive effect on increasing testosterone levels and preventing its decline. That's unlike aerobics or prolonged moderate exercise, which have shown to have negative or no effect on testosterone levels. Having a whey protein meal after exercise can further enhance the satiety/testosterone-boosting impact (hunger hormones cause the opposite effect on your testosterone and libido). Here's a summary of what a typical high-intensity Peak Fitness routine might look like: " Warm up for three minutes  " Exercise as hard and fast as you can for 30 seconds. You should feel like you couldn't possibly go on another few seconds  " Recover at a slow to moderate pace for 90 seconds  " Repeat the high intensity exercise and recovery 7 more times .  3.   Consume Plenty of Zinc The mineral zinc is important for testosterone production, and supplementing your diet for as little as six weeks has been shown to cause a marked improvement in testosterone among men with low levels.1 Likewise, research has shown that restricting dietary sources of zinc leads to a significant decrease in testosterone, while zinc supplementation increases it2 -- and even protects men from exercised-induced reductions in testosterone levels.3 It's estimated that up to 45 percent of adults over the age of 60 may have lower than recommended zinc intakes; even when dietary supplements were added in, an estimated 20-25 percent of older adults still had inadequate  zinc intakes, according to a National Health and Nutrition Examination Survey.4 Your diet is the best source of zinc; along with protein-rich foods like meats and fish, other good dietary sources of zinc include raw milk, raw cheese, beans, and yogurt or kefir made from raw milk. It can be difficult to obtain enough dietary zinc if you're a vegetarian, and also for meat-eaters as well, largely because of conventional farming methods that rely heavily on chemical fertilizers and pesticides. These chemicals deplete the soil of nutrients ... nutrients like zinc that must be absorbed by plants in order to be passed on to you. In many cases, you may further deplete the nutrients in your food by the way you prepare it. For most food, cooking it will drastically reduce its levels of nutrients like zinc . particularly over-cooking, which many people do. If you decide to use a zinc supplement, stick to a dosage of less than 40 mg a day, as this is the recommended adult upper limit. Taking too much zinc can interfere with your body's ability to absorb other minerals, especially copper, and may cause nausea as a side effect.  4.   Strength Training In addition to Peak Fitness, strength training is also known to boost testosterone levels, provided you are doing so intensely enough. When strength training to boost testosterone, you'll want to increase the weight and lower your number of reps, and then focus on exercises that work a large number of muscles, such as dead lifts or squats.  You can "turbo-charge" your weight training by going slower. By slowing down your movement, you're actually turning it into a high-intensity exercise. Super Slow   movement allows your muscle, at the microscopic level, to access the maximum number of cross-bridges between the protein filaments that produce movement in the muscle.   5.   Optimize Your Vitamin D Levels Vitamin D, a steroid hormone, is essential for the healthy development  of the nucleus of the sperm cell, and helps maintain semen quality and sperm count. Vitamin D also increases levels of testosterone, which may boost libido. In one study, overweight men who were given vitamin D supplements had a significant increase in testosterone levels after one year.5   6.   Reduce Stress When you're under a lot of stress, your body releases high levels of the stress hormone cortisol. This hormone actually blocks the effects of testosterone,6 presumably because, from a biological standpoint, testosterone-associated behaviors (mating, competing, aggression) may have lowered your chances of survival in an emergency (hence, the "fight or flight" response is dominant, courtesy of cortisol).  7.   Limit or Eliminate Sugar from Your Diet Testosterone levels decrease after you eat sugar, which is likely because the sugar leads to a high insulin level, another factor leading to low testosterone.7 Based on USDA estimates, the average American consumes 12 teaspoons of sugar a day, which equates to about TWO TONS of sugar during a lifetime.  8.   Eat Healthy Fats By healthy, this means not only mon- and polyunsaturated fats, like that found in avocadoes and nuts, but also saturated, as these are essential for building testosterone. Research shows that a diet with less than 40 percent of energy as fat (and that mainly from animal sources, i.e. saturated) lead to a decrease in testosterone levels.8 My personal diet is about 60-70 percent healthy fat, and other experts agree that the ideal diet includes somewhere between 50-70 percent fat.  It's important to understand that your body requires saturated fats from animal and vegetable sources (such as meat, dairy, certain oils, and tropical plants like coconut) for optimal functioning, and if you neglect this important food group in favor of sugar, grains and other starchy carbs, your health and weight are almost guaranteed to suffer. Examples of  healthy fats you can eat more of to give your testosterone levels a boost include: Olives and Olive oil  Coconuts and coconut oil Butter made from raw grass-fed organic milk Raw nuts, such as, almonds or pecans Organic pastured egg yolks Avocados Grass-fed meats Palm oil Unheated organic nut oils   9.   Boost Your Intake of Branch Chain Amino Acids (BCAA) from Foods Like [place] suggests that BCAAs result in higher testosterone levels, particularly when taken along with resistance training.9 While BCAAs are available in supplement form, you'll find the highest concentrations of BCAAs like leucine in dairy products - especially quality cheeses and whey protein. Even when getting leucine from your natural food supply, it's often wasted or used as a building block instead of an anabolic agent. So to create the correct anabolic environment, you need to boost leucine consumption way beyond mere maintenance levels. That said, keep in mind that using leucine as a free form amino acid can be highly counterproductive as when free form amino acids are artificially administrated, they rapidly enter your circulation while disrupting insulin function, and impairing your body's glycemic control. Food-based leucine is really the ideal form that can benefit your muscles without side effects.   Nexium/protonix/prilosec are called PPI's, they are great at healing your stomach but should only be taken for a short period of time.  Studies are showing that taken for a long time it can increase the risk of osteoporosis (weakening of your bones), pneumonia, low magnesium, restless legs, Cdiff (infection that causes diarrhea), and most recently kidney disease/insufficiency.  Due to this information we want to try to stop the PPI but if you try to stop it abruptly this can cause rebound acid and worsening symptoms.   So this is how we want you to get off the PPI: - Start taking the nexium/protonix/prilosec or  which every PPI you are on every other day for 2 week while starting to take pepcid or zantac (generic is fine) 2 x a day - then decrease the PPI to every 3 days for 2 weeks and then stop while continuing on the zantac or pepcid twice daily. - then you can try once at night for 2 weeks - you can continue on this once at night or stop all together - Avoid alcohol, spicy foods, NSAIDS (aleve, ibuprofen) at this time. See foods below.   Food Choices for Gastroesophageal Reflux Disease When you have gastroesophageal reflux disease (GERD), the foods you eat and your eating habits are very important. Choosing the right foods can help ease the discomfort of GERD. WHAT GENERAL GUIDELINES DO I NEED TO FOLLOW?  Choose fruits, vegetables, whole grains, low-fat dairy products, and low-fat meat, fish, and poultry.  Limit fats such as oils, salad dressings, butter, nuts, and avocado.  Keep a food diary to identify foods that cause symptoms.  Avoid foods that cause reflux. These may be different for different people.  Eat frequent small meals instead of three large meals each day.  Eat your meals slowly, in a relaxed setting.  Limit fried foods.  Cook foods using methods other than frying.  Avoid drinking alcohol.  Avoid drinking large amounts of liquids with your meals.  Avoid bending over or lying down until 2-3 hours after eating. WHAT FOODS ARE NOT RECOMMENDED? The following are some foods and drinks that may worsen your symptoms: Vegetables Tomatoes. Tomato juice. Tomato and spaghetti sauce. Chili peppers. Onion and garlic. Horseradish. Fruits Oranges, grapefruit, and lemon (fruit and juice). Meats High-fat meats, fish, and poultry. This includes hot dogs, ribs, ham, sausage, salami, and bacon. Dairy Whole milk and chocolate milk. Sour cream. Cream. Butter. Ice cream. Cream cheese.  Beverages Coffee and tea, with or without caffeine. Carbonated beverages or energy  drinks. Condiments Hot sauce. Barbecue sauce.  Sweets/Desserts Chocolate and cocoa. Donuts. Peppermint and spearmint. Fats and Oils High-fat foods, including [place] fries and potato chips. Other Vinegar. Strong spices, such as black pepper, white pepper, red pepper, cayenne, curry powder, cloves, ginger, and chili powder.

## 2014-08-19 LAB — VITAMIN D 25 HYDROXY (VIT D DEFICIENCY, FRACTURES): Vit D, 25-Hydroxy: 33 ng/mL (ref 30–100)

## 2014-08-19 LAB — TSH: TSH: 1.184 u[IU]/mL (ref 0.350–4.500)

## 2014-08-24 ENCOUNTER — Encounter: Payer: Self-pay | Admitting: Gastroenterology

## 2014-08-24 ENCOUNTER — Encounter: Payer: Self-pay | Admitting: *Deleted

## 2014-09-29 ENCOUNTER — Encounter: Payer: Self-pay | Admitting: Internal Medicine

## 2014-10-23 ENCOUNTER — Encounter: Payer: Self-pay | Admitting: Gastroenterology

## 2014-11-20 ENCOUNTER — Other Ambulatory Visit: Payer: Self-pay | Admitting: Physician Assistant

## 2014-12-08 ENCOUNTER — Encounter: Payer: Self-pay | Admitting: Internal Medicine

## 2014-12-08 ENCOUNTER — Ambulatory Visit (INDEPENDENT_AMBULATORY_CARE_PROVIDER_SITE_OTHER): Payer: 59 | Admitting: Internal Medicine

## 2014-12-08 VITALS — BP 148/96 | HR 72 | Ht 71.25 in | Wt 191.2 lb

## 2014-12-08 DIAGNOSIS — K219 Gastro-esophageal reflux disease without esophagitis: Secondary | ICD-10-CM | POA: Diagnosis not present

## 2014-12-08 DIAGNOSIS — Z1211 Encounter for screening for malignant neoplasm of colon: Secondary | ICD-10-CM | POA: Diagnosis not present

## 2014-12-08 NOTE — Patient Instructions (Signed)
You have been scheduled for a colonoscopy. Please follow written instructions given to you at your visit today.  If you use inhalers (even only as needed), please bring them with you on the day of your procedure. Your physician has requested that you go to www.[email or web address] and enter the access code given to you at your visit today. This web site gives a general overview about your procedure. However, you should still follow specific instructions given to you by our office regarding your preparation for the procedure.  

## 2014-12-08 NOTE — Progress Notes (Signed)
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 The patient's history is remarkable for GERD for which she takes pantoprazole on demand. Symptoms are most prominent with dietary indiscretion, particularly when traveling. No dysphagia. GI review of systems is otherwise negative. No family history of colon cancer.  REVIEW OF SYSTEMS:  All non-GI ROS negative except for back pain, sinus and allergy, skin rash  Past Medical History  Diagnosis Date  . Allergy   . Asthma   . Depression   . GERD (gastroesophageal reflux disease)   . ED (erectile dysfunction)   . Hypertension   . Hyperlipidemia   . Hypogonadism in male   . Osteoarthritis   . Vitamin D deficiency   . Anxiety   . Basal cell carcinoma     Past Surgical History  Procedure Laterality Date  . Vasectomy  2013  . Orthopedic surgery      left shoulder  . Hernia repair      54 yrs old  . Lasik      Social History Stephen Campbell  reports that he has been smoking Cigars.  He has never used smokeless tobacco. He reports that he drinks alcohol. He reports that he does not use illicit drugs.  family history includes Asthma in his father; Heart disease in his father and mother; Kidney disease in his father; Spondylitis in his brother; Uterine cancer in his mother. There is no history of Colon cancer, Colon polyps, Gallbladder disease, Esophageal cancer, or Diabetes.  No Known Allergies     PHYSICAL EXAMINATION:  Vital signs: BP 148/96 mmHg  Pulse 72  Ht 5' 11.25" (1.81 m)  Wt 191 lb 4 oz (86.75 kg)  BMI 26.48 kg/m2  Constitutional: generally well-appearing, no acute distress Psychiatric: alert and oriented x3, cooperative Eyes: extraocular movements intact, anicteric, conjunctiva pink Mouth: oral pharynx moist, no lesions Neck: supple no  lymphadenopathy Cardiovascular: heart regular rate and rhythm, no murmur Lungs: clear to auscultation bilaterally Abdomen: soft, nontender, nondistended, no obvious ascites, no peritoneal signs, normal bowel sounds, no organomegaly Rectal: Deferred until colonoscopy Extremities: no lower extremity edema bilaterally Skin: no lesions on visible extremities Neuro: No focal deficits.   ASSESSMENT:  #1. GERD. Uncomplicated without alarm features #2. Colon cancer screening. Baseline risk   PLAN:  #1. Continue on demand PPI as this is effective #2. Screening colonoscopy.The nature of the procedure, as well as the risks (discussed perforation and post-polypectomy bleeding at length), benefits, and alternatives (discussed flexible sigmoidoscopy at his request. Also discussed virtual colonoscopy and cologuard) were carefully and THOROUGHLY reviewed with the patient. Ample time for discussion and questions allowed. The patient understood, was satisfied, and agreed to proceed. #3. Discussed preparation options. He will have Pikoprep. Instructions provided today   A copy of this consultation note has been sent to Stephen Mutters PA-C/Dr. Melford Campbell

## 2015-02-05 ENCOUNTER — Encounter: Payer: Self-pay | Admitting: Physician Assistant

## 2015-02-09 ENCOUNTER — Encounter: Payer: Self-pay | Admitting: Internal Medicine

## 2015-02-09 ENCOUNTER — Encounter: Payer: Self-pay | Admitting: Physician Assistant

## 2015-02-09 ENCOUNTER — Ambulatory Visit (INDEPENDENT_AMBULATORY_CARE_PROVIDER_SITE_OTHER): Payer: 59 | Admitting: Physician Assistant

## 2015-02-09 VITALS — BP 148/100 | HR 80 | Temp 97.3°F | Resp 16 | Ht 72.5 in | Wt 193.8 lb

## 2015-02-09 DIAGNOSIS — E291 Testicular hypofunction: Secondary | ICD-10-CM

## 2015-02-09 DIAGNOSIS — E785 Hyperlipidemia, unspecified: Secondary | ICD-10-CM

## 2015-02-09 DIAGNOSIS — J4520 Mild intermittent asthma, uncomplicated: Secondary | ICD-10-CM

## 2015-02-09 DIAGNOSIS — D649 Anemia, unspecified: Secondary | ICD-10-CM

## 2015-02-09 DIAGNOSIS — F32A Depression, unspecified: Secondary | ICD-10-CM

## 2015-02-09 DIAGNOSIS — Z0000 Encounter for general adult medical examination without abnormal findings: Secondary | ICD-10-CM

## 2015-02-09 DIAGNOSIS — Z79899 Other long term (current) drug therapy: Secondary | ICD-10-CM

## 2015-02-09 DIAGNOSIS — Z85828 Personal history of other malignant neoplasm of skin: Secondary | ICD-10-CM | POA: Insufficient documentation

## 2015-02-09 DIAGNOSIS — Z0001 Encounter for general adult medical examination with abnormal findings: Secondary | ICD-10-CM

## 2015-02-09 DIAGNOSIS — E559 Vitamin D deficiency, unspecified: Secondary | ICD-10-CM

## 2015-02-09 DIAGNOSIS — K2100 Gastro-esophageal reflux disease with esophagitis, without bleeding: Secondary | ICD-10-CM

## 2015-02-09 DIAGNOSIS — L409 Psoriasis, unspecified: Secondary | ICD-10-CM | POA: Insufficient documentation

## 2015-02-09 DIAGNOSIS — Z1211 Encounter for screening for malignant neoplasm of colon: Secondary | ICD-10-CM

## 2015-02-09 DIAGNOSIS — F329 Major depressive disorder, single episode, unspecified: Secondary | ICD-10-CM

## 2015-02-09 DIAGNOSIS — N529 Male erectile dysfunction, unspecified: Secondary | ICD-10-CM

## 2015-02-09 DIAGNOSIS — I10 Essential (primary) hypertension: Secondary | ICD-10-CM

## 2015-02-09 DIAGNOSIS — Z131 Encounter for screening for diabetes mellitus: Secondary | ICD-10-CM

## 2015-02-09 DIAGNOSIS — Z125 Encounter for screening for malignant neoplasm of prostate: Secondary | ICD-10-CM

## 2015-02-09 DIAGNOSIS — Z1159 Encounter for screening for other viral diseases: Secondary | ICD-10-CM

## 2015-02-09 LAB — CBC WITH DIFFERENTIAL/PLATELET
BASOS PCT: 1 % (ref 0–1)
Basophils Absolute: 0.1 10*3/uL (ref 0.0–0.1)
EOS ABS: 0.3 10*3/uL (ref 0.0–0.7)
Eosinophils Relative: 5 % (ref 0–5)
HCT: 46.8 % (ref 39.0–52.0)
Hemoglobin: 15.8 g/dL (ref 13.0–17.0)
Lymphocytes Relative: 29 % (ref 12–46)
Lymphs Abs: 1.5 10*3/uL (ref 0.7–4.0)
MCH: 31.1 pg (ref 26.0–34.0)
MCHC: 33.8 g/dL (ref 30.0–36.0)
MCV: 92.1 fL (ref 78.0–100.0)
MPV: 10.6 fL (ref 8.6–12.4)
Monocytes Absolute: 0.4 10*3/uL (ref 0.1–1.0)
Monocytes Relative: 8 % (ref 3–12)
NEUTROS PCT: 57 % (ref 43–77)
Neutro Abs: 2.9 10*3/uL (ref 1.7–7.7)
PLATELETS: 215 10*3/uL (ref 150–400)
RBC: 5.08 MIL/uL (ref 4.22–5.81)
RDW: 12.9 % (ref 11.5–15.5)
WBC: 5 10*3/uL (ref 4.0–10.5)

## 2015-02-09 MED ORDER — TRIAMCINOLONE ACETONIDE 0.1 % EX CREA: 1.0000 "application " | TOPICAL_CREAM | Freq: Two times a day (BID) | CUTANEOUS | Status: DC

## 2015-02-09 MED ORDER — ALBUTEROL SULFATE HFA 108 (90 BASE) MCG/ACT IN AERS: 2.0000 | INHALATION_SPRAY | RESPIRATORY_TRACT | Status: DC | PRN

## 2015-02-09 MED ORDER — ALPRAZOLAM 0.5 MG PO TABS: 0.5000 mg | ORAL_TABLET | Freq: Two times a day (BID) | ORAL | Status: DC | PRN

## 2015-02-09 NOTE — Patient Instructions (Addendum)
GETTING OFF OF PPI's    Nexium/protonix/prilosec/Omeprazole/Dexilant/Aciphex are called PPI's, they are great at healing your stomach but should only be taken for a short period of time.     Recent studies have shown that taken for a long time they  can increase the risk of osteoporosis (weakening of your bones), pneumonia, low magnesium, restless legs, Cdiff (infection that causes diarrhea), DEMENTIA and most recently kidney damage / disease / insufficiency.     Due to this information we want to try to stop the PPI but if you try to stop it abruptly this can cause rebound acid and worsening symptoms.   So this is how we want you to get off the PPI:  - Start taking the nexium/protonix/prilosec/PPI  every other day with  zantac (ranitidine) 2 x a day for 2-4 weeks  - then decrease the PPI to every 3 days while taking the zantac (ranitidine) twice a day the other  days for 2-4  Weeks  - then you can try the zantac (ranitidine) once at night or up to 2 x day as needed.  - you can continue on this once at night or stop all together  - Avoid alcohol, spicy foods, NSAIDS (aleve, ibuprofen) at this time. See foods below.   +++++++++++++++++++++++++++++++++++++++++++  Food Choices for Gastroesophageal Reflux Disease  When you have gastroesophageal reflux disease (GERD), the foods you eat and your eating habits are very important. Choosing the right foods can help ease the discomfort of GERD. WHAT GENERAL GUIDELINES DO I NEED TO FOLLOW?  Choose fruits, vegetables, whole grains, low-fat dairy products, and low-fat meat, fish, and poultry.  Limit fats such as oils, salad dressings, butter, nuts, and avocado.  Keep a food diary to identify foods that cause symptoms.  Avoid foods that cause reflux. These may be different for different people.  Eat frequent small meals instead of three large meals each day.  Eat your meals slowly, in a relaxed setting.  Limit fried foods.  Cook foods  using methods other than frying.  Avoid drinking alcohol.  Avoid drinking large amounts of liquids with your meals.  Avoid bending over or lying down until 2-3 hours after eating.   WHAT FOODS ARE NOT RECOMMENDED? The following are some foods and drinks that may worsen your symptoms:  Vegetables Tomatoes. Tomato juice. Tomato and spaghetti sauce. Chili peppers. Onion and garlic. Horseradish. Fruits Oranges, grapefruit, and lemon (fruit and juice). Meats High-fat meats, fish, and poultry. This includes hot dogs, ribs, ham, sausage, salami, and bacon. Dairy Whole milk and chocolate milk. Sour cream. Cream. Butter. Ice cream. Cream cheese.  Beverages Coffee and tea, with or without caffeine. Carbonated beverages or energy drinks. Condiments Hot sauce. Barbecue sauce.  Sweets/Desserts Chocolate and cocoa. Donuts. Peppermint and spearmint. Fats and Oils High-fat foods, including [place] fries and potato chips. Other Vinegar. Strong spices, such as black pepper, white pepper, red pepper, cayenne, curry powder, cloves, ginger, and chili powder. Nexium/protonix/prilosec are called PPI's, they are great at healing your stomach but should only be taken for a short period of time.   Preventive Care for Adults  A healthy lifestyle and preventive care can promote health and wellness. Preventive health guidelines for men include the following key practices:  A routine yearly physical is a good way to check with your health care provider about your health and preventative screening. It is a chance to share any concerns and updates on your health and to receive a thorough exam.  Visit  your dentist for a routine exam and preventative care every 6 months. Brush your teeth twice a day and floss once a day. Good oral hygiene prevents tooth decay and gum disease.  The frequency of eye exams is based on your age, health, family medical history, use of contact lenses, and other factors. Follow your  health care provider's recommendations for frequency of eye exams.  Eat a healthy diet. Foods such as vegetables, fruits, whole grains, low-fat dairy products, and lean protein foods contain the nutrients you need without too many calories. Decrease your intake of foods high in solid fats, added sugars, and salt. Eat the right amount of calories for you.Get information about a proper diet from your health care provider, if necessary.  Regular physical exercise is one of the most important things you can do for your health. Most adults should get at least 150 minutes of moderate-intensity exercise (any activity that increases your heart rate and causes you to sweat) each week. In addition, most adults need muscle-strengthening exercises on 2 or more days a week.  Maintain a healthy weight. The body mass index (BMI) is a screening tool to identify possible weight problems. It provides an estimate of body fat based on height and weight. Your health care provider can find your BMI and can help you achieve or maintain a healthy weight.For adults 20 years and older:  A BMI below 18.5 is considered underweight.  A BMI of 18.5 to 24.9 is normal.  A BMI of 25 to 29.9 is considered overweight.  A BMI of 30 and above is considered obese.  Maintain normal blood lipids and cholesterol levels by exercising and minimizing your intake of saturated fat. Eat a balanced diet with plenty of fruit and vegetables. Blood tests for lipids and cholesterol should begin at age 58 and be repeated every 5 years. If your lipid or cholesterol levels are high, you are over 50, or you are at high risk for heart disease, you may need your cholesterol levels checked more frequently.Ongoing high lipid and cholesterol levels should be treated with medicines if diet and exercise are not working.  If you smoke, find out from your health care provider how to quit. If you do not use tobacco, do not start.  Lung cancer screening is  recommended for adults aged 58-80 years who are at high risk for developing lung cancer because of a history of smoking. A yearly low-dose CT scan of the lungs is recommended for people who have at least a 30-pack-year history of smoking and are a current smoker or have quit within the past 15 years. A pack year of smoking is smoking an average of 1 pack of cigarettes a day for 1 year (for example: 1 pack a day for 30 years or 2 packs a day for 15 years). Yearly screening should continue until the smoker has stopped smoking for at least 15 years. Yearly screening should be stopped for people who develop a health problem that would prevent them from having lung cancer treatment.  If you choose to drink alcohol, do not have more than 2 drinks per day. One drink is considered to be 12 ounces (355 mL) of beer, 5 ounces (148 mL) of wine, or 1.5 ounces (44 mL) of liquor.  Avoid use of street drugs. Do not share needles with anyone. Ask for help if you need support or instructions about stopping the use of drugs.  High blood pressure causes heart disease and increases the risk  of stroke. Your blood pressure should be checked at least every 1-2 years. Ongoing high blood pressure should be treated with medicines, if weight loss and exercise are not effective.  If you are 65-42 years old, ask your health care provider if you should take aspirin to prevent heart disease.  Diabetes screening involves taking a blood sample to check your fasting blood sugar level. This should be done once every 3 years, after age 72, if you are within normal weight and without risk factors for diabetes. Testing should be considered at a younger age or be carried out more frequently if you are overweight and have at least 1 risk factor for diabetes.  Colorectal cancer can be detected and often prevented. Most routine colorectal cancer screening begins at the age of 38 and continues through age 40. However, your health care provider may  recommend screening at an earlier age if you have risk factors for colon cancer. On a yearly basis, your health care provider may provide home test kits to check for hidden blood in the stool. Use of a small camera at the end of a tube to directly examine the colon (sigmoidoscopy or colonoscopy) can detect the earliest forms of colorectal cancer. Talk to your health care provider about this at age 96, when routine screening begins. Direct exam of the colon should be repeated every 5-10 years through age 75, unless early forms of precancerous polyps or small growths are found.   Talk with your health care provider about prostate cancer screening.  Testicular cancer screening isrecommended for adult males. Screening includes self-exam, a health care provider exam, and other screening tests. Consult with your health care provider about any symptoms you have or any concerns you have about testicular cancer.  Use sunscreen. Apply sunscreen liberally and repeatedly throughout the day. You should seek shade when your shadow is shorter than you. Protect yourself by wearing long sleeves, pants, a wide-brimmed hat, and sunglasses year round, whenever you are outdoors.  Once a month, do a whole-body skin exam, using a mirror to look at the skin on your back. Tell your health care provider about new moles, moles that have irregular borders, moles that are larger than a pencil eraser, or moles that have changed in shape or color.  Stay current with required vaccines (immunizations).  Influenza vaccine. All adults should be immunized every year.  Tetanus, diphtheria, and acellular pertussis (Td, Tdap) vaccine. An adult who has not previously received Tdap or who does not know his vaccine status should receive 1 dose of Tdap. This initial dose should be followed by tetanus and diphtheria toxoids (Td) booster doses every 10 years. Adults with an unknown or incomplete history of completing a 3-dose immunization series  with Td-containing vaccines should begin or complete a primary immunization series including a Tdap dose. Adults should receive a Td booster every 10 years.  Varicella vaccine. An adult without evidence of immunity to varicella should receive 2 doses or a second dose if he has previously received 1 dose.  Human papillomavirus (HPV) vaccine. Males aged 53-21 years who have not received the vaccine previously should receive the 3-dose series. Males aged 22-26 years may be immunized. Immunization is recommended through the age of 70 years for any male who has sex with males and did not get any or all doses earlier. Immunization is recommended for any person with an immunocompromised condition through the age of 45 years if he did not get any or all doses earlier.  During the 3-dose series, the second dose should be obtained 4-8 weeks after the first dose. The third dose should be obtained 24 weeks after the first dose and 16 weeks after the second dose.  Zoster vaccine. One dose is recommended for adults aged 24 years or older unless certain conditions are present.    PREVNAR  - Pneumococcal 13-valent conjugate (PCV13) vaccine. When indicated, a person who is uncertain of his immunization history and has no record of immunization should receive the PCV13 vaccine. An adult aged 70 years or older who has certain medical conditions and has not been previously immunized should receive 1 dose of PCV13 vaccine. This PCV13 should be followed with a dose of pneumococcal polysaccharide (PPSV23) vaccine. The PPSV23 vaccine dose should be obtained at least 8 weeks after the dose of PCV13 vaccine. An adult aged 36 years or older who has certain medical conditions and previously received 1 or more doses of PPSV23 vaccine should receive 1 dose of PCV13. The PCV13 vaccine dose should be obtained 1 or more years after the last PPSV23 vaccine dose.    PNEUMOVAX - Pneumococcal polysaccharide (PPSV23) vaccine. When PCV13 is  also indicated, PCV13 should be obtained first. All adults aged 28 years and older should be immunized. An adult younger than age 44 years who has certain medical conditions should be immunized. Any person who resides in a nursing home or long-term care facility should be immunized. An adult smoker should be immunized. People with an immunocompromised condition and certain other conditions should receive both PCV13 and PPSV23 vaccines. People with human immunodeficiency virus (HIV) infection should be immunized as soon as possible after diagnosis. Immunization during chemotherapy or radiation therapy should be avoided. Routine use of PPSV23 vaccine is not recommended for American Indians, [place] Natives, or people younger than 65 years unless there are medical conditions that require PPSV23 vaccine. When indicated, people who have unknown immunization and have no record of immunization should receive PPSV23 vaccine. One-time revaccination 5 years after the first dose of PPSV23 is recommended for people aged 19-64 years who have chronic kidney failure, nephrotic syndrome, asplenia, or immunocompromised conditions. People who received 1-2 doses of PPSV23 before age 70 years should receive another dose of PPSV23 vaccine at age 23 years or later if at least 5 years have passed since the previous dose. Doses of PPSV23 are not needed for people immunized with PPSV23 at or after age 50 years.    Hepatitis A vaccine. Adults who wish to be protected from this disease, have certain high-risk conditions, work with hepatitis A-infected animals, work in hepatitis A research labs, or travel to or work in countries with a high rate of hepatitis A should be immunized. Adults who were previously unvaccinated and who anticipate close contact with an international adoptee during the first 60 days after arrival in the [place] from a country with a high rate of hepatitis A should be immunized.    Hepatitis B vaccine.  Adults should be immunized if they wish to be protected from this disease, have certain high-risk conditions, may be exposed to blood or other infectious body fluids, are household contacts or sex partners of hepatitis B positive people, are clients or workers in certain care facilities, or travel to or work in countries with a high rate of hepatitis B.   Preventive Service / Frequency   Ages 37 to 11  Blood pressure check.  Lipid and cholesterol check  Lung cancer screening. / Every year if  you are aged 51-80 years and have a 30-pack-year history of smoking and currently smoke or have quit within the past 15 years. Yearly screening is stopped once you have quit smoking for at least 15 years or develop a health problem that would prevent you from having lung cancer treatment.  Fecal occult blood test (FOBT) of stool. / Every year beginning at age 13 and continuing until age 105. You may not have to do this test if you get a colonoscopy every 10 years.  Flexible sigmoidoscopy** or colonoscopy.** / Every 5 years for a flexible sigmoidoscopy or every 10 years for a colonoscopy beginning at age 7 and continuing until age 76. Screening for abdominal aortic aneurysm (AAA)  by ultrasound is recommended for people who have history of high blood pressure or who are current or former smokers.

## 2015-02-09 NOTE — Progress Notes (Signed)
Complete Physical  Assessment and Plan: 1. Essential hypertension - monitor at home, if still elevated we will send in alpha blocker, DASH diet, exercise and monitor at home. Call if greater than 130/80.  - CBC with Differential/Platelet - BASIC METABOLIC PANEL WITH GFR - Hepatic function panel - TSH - Urinalysis, Routine w reflex microscopic (not at [facility]) - Microalbumin / creatinine urine ratio - EKG 12-Lead  2. Hyperlipidemia -continue medications, check lipids, decrease fatty foods, increase activity.  - Lipid panel  3. Hypogonadism male Hypogonadism- continue replacement therapy, check testosterone levels as needed.  - Testosterone  4. Asthma, mild intermittent, uncomplicated Resent in inhaler  5. Depression Resent in xanax, takes rarely  6. Gastroesophageal reflux disease with esophagitis GERD- will try to get off PPiI given info for taper and zantac sent in  7. Erectile dysfunction, unspecified erectile dysfunction type meds PRN  8. Vitamin D deficiency - Vit D  25 hydroxy (rtn osteoporosis monitoring)  9. Medication management - Magnesium  10. Colon cancer screening Getting colonoscopy in 2 weeks  11. History of basal cell carcinoma Follows Derm  12. Psoriasis Follows rheum.   39. Screening for viral disease - Hepatitis C antibody - HIV antibody  14. Encounter for general adult medical examination with abnormal findings - CBC with Differential/Platelet - BASIC METABOLIC PANEL WITH GFR - Hepatic function panel - TSH - Lipid panel - Hemoglobin A1c - Insulin, fasting - Vit D  25 hydroxy (rtn osteoporosis monitoring) - Magnesium - Urinalysis, Routine w reflex microscopic (not at [facility]) - Microalbumin / creatinine urine ratio - Iron and TIBC - Ferritin - Vitamin B12 - PSA - Testosterone - EKG 12-Lead - Hepatitis C antibody - HIV antibody  15. Screening for diabetes mellitus - Hemoglobin A1c - Insulin, fasting  16. Anemia, unspecified  anemia type - Iron and TIBC - Ferritin - Vitamin B12  17. Screening PSA (prostate specific antigen) - PSA   Discussed med's effects and SE's. Screening labs and tests as requested with regular follow-up as recommended.  Future Appointments Date Time Provider [place]  02/23/2015 8:00 AM Irene Shipper, MD [facility]  02/15/2016 9:30 AM [name], PA-C [facility] None    HPI  His blood pressure has been controlled at home, today their BP is BP: (!) 148/100 mmHg, rechecked 140/80.  He does workout. He denies chest pain, shortness of breath, dizziness.  He has history of joint pain, had + HLA B27, followed with Dr. Patrecia Pour, had negative work up for [facility] but did have + biopsy for psoriasis, at this time he is only doing topical treatment and mobic PRN, no DMARD. States he is doing well.  He is on cholesterol medication, lipitor and denies myalgias. His cholesterol is at goal. The cholesterol last visit was:   Lab Results  Component Value Date   CHOL 209* 08/18/2014   HDL 40 08/18/2014   LDLCALC 123* 08/18/2014   TRIG 228* 08/18/2014   CHOLHDL 5.2 08/18/2014    Last A1C in the office was:  Lab Results  Component Value Date   HGBA1C 5.3 02/03/2014   He is has GERD, takes protonix when he travels.  He has a history of testosterone deficiency and is on testosterone replacement. He states that the testosterone helps with his energy, libido, muscle mass. Lab Results  Component Value Date   TESTOSTERONE 369 02/03/2014   Lab Results  Component Value Date   PSA 1.22 02/03/2014   Patient is on Vitamin D supplement.  Lab Results  Component Value Date   VD25OH 33 08/18/2014      Current Medications:  Current Outpatient Prescriptions on File Prior to Visit  Medication Sig Dispense Refill  . ALBUTEROL IN Inhale into the lungs. As needed    . aspirin 81 MG chewable tablet Chew by mouth daily.    Marland Kitchen atorvastatin (LIPITOR) 40 MG tablet TAKE ONE TABLET AT BEDTIME. 30  tablet 1  . diclofenac sodium (VOLTAREN) 1 % GEL Apply topically daily.    Marland Kitchen loratadine (CLARITIN) 10 MG tablet Take 10 mg by mouth daily.    . meloxicam (MOBIC) 15 MG tablet Take once daily as needed daily with food for pain, do not take aleve/ibuprofen with it, can take tylenol 90 tablet 0  . Omega-3 Fatty Acids (FISH OIL PO) Take by mouth daily.    . pantoprazole (PROTONIX) 40 MG tablet Take 40 mg by mouth as needed.     . Testosterone (ANDROGEL PUMP) 20.25 MG/ACT (1.62%) GEL 2 pumps each arm daily 75 g 4  . triamcinolone cream (KENALOG) 0.1 % Apply 1 application topically 2 (two) times daily. 45 g 1  . TURMERIC PO Take by mouth daily.     No current facility-administered medications on file prior to visit.   Health Maintenance:  Immunization History  Administered Date(s) Administered  . Influenza Split 03/28/2013, 03/30/2014   Tetanus: 2008 Pneumovax: Prevnar 13:  Flu vaccine: 2015 Zostavax: DEXA: Colonoscopy: DUE, scheduled for 2 weeks.  EGD:  Allergies: No Known Allergies Medical History:  Past Medical History  Diagnosis Date  . Allergy   . Asthma   . Depression   . GERD (gastroesophageal reflux disease)   . ED (erectile dysfunction)   . Hypertension   . Hyperlipidemia   . Hypogonadism in male   . Osteoarthritis   . Vitamin D deficiency   . Anxiety   . Basal cell carcinoma    Surgical History:  Past Surgical History  Procedure Laterality Date  . Vasectomy  2013  . Orthopedic surgery      left shoulder  . Hernia repair      54 yrs old  . Lasik     Family History:  Family History  Problem Relation Age of Onset  . Heart disease Mother   . Uterine cancer Mother   . Asthma Father   . Heart disease Father   . Spondylitis Brother   . Colon cancer Neg Hx   . Colon polyps Neg Hx   . Kidney disease Father   . Gallbladder disease Neg Hx   . Esophageal cancer Neg Hx   . Diabetes Neg Hx    Social History:   Social History  Substance Use Topics  . Smoking  status: Current Some Day Smoker    Types: Cigars  . Smokeless tobacco: Never Used     Comment: smokes cigar occasionally  . Alcohol Use: Yes     Comment: daily 1 glass of wine/beer and weekend more   Review of Systems  Constitutional: Negative.   HENT: Negative.   Eyes: Negative.   Respiratory: Negative.   Cardiovascular: Negative.   Gastrointestinal: Positive for heartburn. Negative for nausea, vomiting, abdominal pain, diarrhea, constipation, blood in stool and melena.  Genitourinary: Negative.        Dribbling  Musculoskeletal: Positive for back pain and joint pain. Negative for myalgias, falls and neck pain.  Skin: Negative.   Neurological: Positive for tingling (bilateral hands at night). Negative for dizziness, tremors, sensory  change, speech change, focal weakness, seizures and loss of consciousness.  Psychiatric/Behavioral: Negative.     Physical Exam: Estimated body mass index is 25.91 kg/(m^2) as calculated from the following:   Height as of this encounter: 6' 0.5" (1.842 m).   Weight as of this encounter: 193 lb 12.8 oz (87.907 kg). BP 148/100 mmHg  Pulse 80  Temp(Src) 97.3 F (36.3 C)  Resp 16  Ht 6' 0.5" (1.842 m)  Wt 193 lb 12.8 oz (87.907 kg)  BMI 25.91 kg/m2 General Appearance: Well nourished, in no apparent distress. Eyes: PERRLA, EOMs, conjunctiva no swelling or erythema, normal fundi and vessels. Sinuses: No Frontal/maxillary tenderness ENT/Mouth: Ext aud canals clear, normal light reflex with TMs without erythema, bulging. Good dentition. No erythema, swelling, or exudate on post pharynx. Tonsils not swollen or erythematous. Hearing normal.  Neck: Supple, thyroid normal. No bruits Respiratory: Respiratory effort normal, BS equal bilaterally without rales, rhonchi, wheezing or stridor. Cardio: RRR without murmurs, rubs or gallops. Brisk peripheral pulses without edema.  Chest: symmetric, with normal excursions and percussion. Abdomen: Soft, +BS. Non  tender, no guarding, rebound, hernias, masses, or organomegaly.  Lymphatics: Non tender without lymphadenopathy.  Genitourinary: defer Musculoskeletal: Full ROM all peripheral extremities,5/5 strength, and normal gait. Skin: Warm, dry without rashes, lesions, ecchymosis.  Neuro: Cranial nerves intact, reflexes equal bilaterally. Normal muscle tone, no cerebellar symptoms. Sensation intact.  Psych: Awake and oriented X 3, normal affect, Insight and Judgment appropriate.   EKG: WNL, PRWP  no changes. AORTA SCAN: defer  [name] 9:44 AM

## 2015-02-10 ENCOUNTER — Encounter: Payer: Self-pay | Admitting: Physician Assistant

## 2015-02-10 LAB — URINALYSIS, ROUTINE W REFLEX MICROSCOPIC
BILIRUBIN URINE: NEGATIVE
Glucose, UA: NEGATIVE
HGB URINE DIPSTICK: NEGATIVE
KETONES UR: NEGATIVE
Leukocytes, UA: NEGATIVE
NITRITE: NEGATIVE
Protein, ur: NEGATIVE
SPECIFIC GRAVITY, URINE: 1.02 (ref 1.001–1.035)
pH: 8 (ref 5.0–8.0)

## 2015-02-10 LAB — MICROALBUMIN / CREATININE URINE RATIO
CREATININE, URINE: 189.5 mg/dL
MICROALB UR: 0.8 mg/dL (ref <2.0)
Microalb Creat Ratio: 4.2 mg/g (ref 0.0–30.0)

## 2015-02-10 LAB — VITAMIN D 25 HYDROXY (VIT D DEFICIENCY, FRACTURES): VIT D 25 HYDROXY: 34 ng/mL (ref 30–100)

## 2015-02-10 LAB — VITAMIN B12: VITAMIN B 12: 395 pg/mL (ref 211–911)

## 2015-02-10 LAB — BASIC METABOLIC PANEL WITH GFR
BUN: 13 mg/dL (ref 7–25)
CHLORIDE: 103 mmol/L (ref 98–110)
CO2: 27 mmol/L (ref 20–31)
CREATININE: 0.9 mg/dL (ref 0.70–1.33)
Calcium: 9.6 mg/dL (ref 8.6–10.3)
GFR, Est Non African American: >89 mL/min (ref >=60)
Glucose, Bld: 97 mg/dL (ref 65–99)
Potassium: 4.3 mmol/L (ref 3.5–5.3)
SODIUM: 141 mmol/L (ref 135–146)

## 2015-02-10 LAB — HEPATIC FUNCTION PANEL
ALT: 51 U/L — ABNORMAL HIGH (ref 9–46)
AST: 32 U/L (ref 10–35)
Albumin: 4.5 g/dL (ref 3.6–5.1)
Alkaline Phosphatase: 41 U/L (ref 40–115)
BILIRUBIN DIRECT: 0.2 mg/dL (ref <=0.2)
BILIRUBIN TOTAL: 1 mg/dL (ref 0.2–1.2)
Indirect Bilirubin: 0.8 mg/dL (ref 0.2–1.2)
Total Protein: 7.2 g/dL (ref 6.1–8.1)

## 2015-02-10 LAB — INSULIN, FASTING: INSULIN FASTING, SERUM: 8.8 u[IU]/mL (ref 2.0–19.6)

## 2015-02-10 LAB — HIV ANTIBODY (ROUTINE TESTING W REFLEX): HIV: NONREACTIVE

## 2015-02-10 LAB — PSA: PSA: 1.58 ng/mL (ref <=4.00)

## 2015-02-10 LAB — HEMOGLOBIN A1C
HEMOGLOBIN A1C: 5.4 % (ref <5.7)
MEAN PLASMA GLUCOSE: 108 mg/dL (ref <117)

## 2015-02-10 LAB — FERRITIN: FERRITIN: 330 ng/mL — AB (ref 22–322)

## 2015-02-10 LAB — MAGNESIUM: Magnesium: 1.8 mg/dL (ref 1.5–2.5)

## 2015-02-10 LAB — HEPATITIS C ANTIBODY: HCV AB: NEGATIVE

## 2015-02-10 LAB — IRON AND TIBC
%SAT: 41 % (ref 20–55)
Iron: 117 ug/dL (ref 42–165)
TIBC: 287 ug/dL (ref 215–435)
UIBC: 170 ug/dL (ref 125–400)

## 2015-02-10 LAB — TESTOSTERONE: Testosterone: 252 ng/dL — ABNORMAL LOW (ref 300–890)

## 2015-02-10 LAB — TSH: TSH: 1.324 u[IU]/mL (ref 0.350–4.500)

## 2015-02-14 NOTE — Addendum Note (Signed)
Addended by: [name] R on: 02/14/2015 08:36 AM   Modules accepted: Orders, SmartSet

## 2015-02-23 ENCOUNTER — Ambulatory Visit (AMBULATORY_SURGERY_CENTER): Payer: 59 | Admitting: Internal Medicine

## 2015-02-23 ENCOUNTER — Encounter: Payer: Self-pay | Admitting: Internal Medicine

## 2015-02-23 VITALS — BP 135/90 | HR 64 | Temp 98.0°F | Resp 31 | Ht 71.0 in | Wt 191.0 lb

## 2015-02-23 DIAGNOSIS — Z1211 Encounter for screening for malignant neoplasm of colon: Secondary | ICD-10-CM | POA: Diagnosis not present

## 2015-02-23 MED ORDER — SODIUM CHLORIDE 0.9 % IV SOLN: 500.0000 mL | INTRAVENOUS | Status: DC

## 2015-02-23 NOTE — Patient Instructions (Signed)
YOU HAD AN ENDOSCOPIC PROCEDURE TODAY AT THE [facility]:   Refer to the procedure report that was given to you for any specific questions about what was found during the examination.  If the procedure report does not answer your questions, please call your gastroenterologist to clarify.  If you requested that your care partner not be given the details of your procedure findings, then the procedure report has been included in a sealed envelope for you to review at your convenience later.  YOU SHOULD EXPECT: Some feelings of bloating in the abdomen. Passage of more gas than usual.  Walking can help get rid of the air that was put into your GI tract during the procedure and reduce the bloating. If you had a lower endoscopy (such as a colonoscopy or flexible sigmoidoscopy) you may notice spotting of blood in your stool or on the toilet paper. If you underwent a bowel prep for your procedure, you may not have a normal bowel movement for a few days.  Please Note:  You might notice some irritation and congestion in your nose or some drainage.  This is from the oxygen used during your procedure.  There is no need for concern and it should clear up in a day or so.  SYMPTOMS TO REPORT IMMEDIATELY:   Following lower endoscopy (colonoscopy or flexible sigmoidoscopy):  Excessive amounts of blood in the stool  Significant tenderness or worsening of abdominal pains  Swelling of the abdomen that is new, acute  Fever of 100F or higher    For urgent or emergent issues, a gastroenterologist can be reached at any hour by calling [phone].   DIET: Your first meal following the procedure should be a small meal and then it is ok to progress to your normal diet. Heavy or fried foods are harder to digest and may make you feel nauseous or bloated.  Likewise, meals heavy in dairy and vegetables can increase bloating.  Drink plenty of fluids but you should avoid alcoholic beverages for 24  hours.  ACTIVITY:  You should plan to take it easy for the rest of today and you should NOT DRIVE or use heavy machinery until tomorrow (because of the sedation medicines used during the test).    FOLLOW UP: Our staff will call the number listed on your records the next business day following your procedure to check on you and address any questions or concerns that you may have regarding the information given to you following your procedure. If we do not reach you, we will leave a message.  However, if you are feeling well and you are not experiencing any problems, there is no need to return our call.  We will assume that you have returned to your regular daily activities without incident.  If any biopsies were taken you will be contacted by phone or by letter within the next 1-3 weeks.  Please call us at [phone] if you have not heard about the biopsies in 3 weeks.    SIGNATURES/CONFIDENTIALITY: You and/or your care partner have signed paperwork which will be entered into your electronic medical record.  These signatures attest to the fact that that the information above on your After Visit Summary has been reviewed and is understood.  Full responsibility of the confidentiality of this discharge information lies with you and/or your care-partner.   INFORMATION ON DIVERTICULOSIS GIVEN TO YOU TODAY 

## 2015-02-23 NOTE — Op Note (Signed)
[place]  [address] [place], [address]   COLONOSCOPY PROCEDURE REPORT  PATIENT: Stephen Campbell, Stephen Campbell  MR#: [id number] BIRTHDATE: 10/04/1960 , 53  yrs. old GENDER: male ENDOSCOPIST: Eustace Quail, MD REFERRED EB:RAXENM Collier, P.A. PROCEDURE DATE:  02/23/2015 PROCEDURE:   Colonoscopy, screening First Screening Colonoscopy - Avg.  risk and is 50 yrs.  old or older Yes.  Prior Negative Screening - Now for repeat screening. N/A  History of Adenoma - Now for follow-up colonoscopy & has been > or = to 3 yrs.  N/A  Polyps removed today? No Recommend repeat exam, <10 yrs? No ASA CLASS:   Class II INDICATIONS:Screening for colonic neoplasia and Colorectal Neoplasm Risk Assessment for this procedure is average risk. MEDICATIONS: Monitored anesthesia care and Propofol 400 mg IV  DESCRIPTION OF PROCEDURE:   After the risks benefits and alternatives of the procedure were thoroughly explained, informed consent was obtained.  The digital rectal exam revealed no abnormalities of the rectum.   The LB [id number]  endoscope was introduced through the anus and advanced to the cecum, which was identified by both the appendix and ileocecal valve. No adverse events experienced.   The quality of the prep was good.  (Prepopik was used)  The instrument was then slowly withdrawn as the colon was fully examined. Estimated blood loss is zero unless otherwise noted in this procedure report.      COLON FINDINGS: There was severe diverticulosis noted in the sigmoid colon.   The examination was otherwise normal. No polyps identified.  Retroflexed views revealed internal hemorrhoids. The time to cecum = 6.2 Withdrawal time = 14.2   The scope was withdrawn and the procedure completed. COMPLICATIONS: There were no immediate complications.  ENDOSCOPIC IMPRESSION: 1.   Severe diverticulosis was noted in the sigmoid colon 2.   The examination was otherwise  normal  RECOMMENDATIONS: 1.Continue current colorectal screening recommendations for "routine risk" patients with a repeat colonoscopy in 10 years.  eSigned:  Eustace Quail, MD 02/23/2015 9:14 AM   cc: The Patient, [name], PA-C, and Unk Pinto, MD

## 2015-02-23 NOTE — Progress Notes (Signed)
Report to PACU, RN, vss, BBS= Clear.  

## 2015-02-26 ENCOUNTER — Telehealth: Payer: Self-pay | Admitting: *Deleted

## 2015-02-26 NOTE — Telephone Encounter (Signed)
Left message that we called for f/u 

## 2015-03-06 ENCOUNTER — Other Ambulatory Visit: Payer: Self-pay | Admitting: Physician Assistant

## 2015-05-04 ENCOUNTER — Ambulatory Visit (INDEPENDENT_AMBULATORY_CARE_PROVIDER_SITE_OTHER): Payer: 59 | Admitting: *Deleted

## 2015-05-04 DIAGNOSIS — Z23 Encounter for immunization: Secondary | ICD-10-CM | POA: Diagnosis not present

## 2015-06-14 ENCOUNTER — Ambulatory Visit (INDEPENDENT_AMBULATORY_CARE_PROVIDER_SITE_OTHER): Payer: 59 | Admitting: Physician Assistant

## 2015-06-14 ENCOUNTER — Encounter: Payer: Self-pay | Admitting: Physician Assistant

## 2015-06-14 VITALS — BP 126/70 | HR 102 | Temp 98.1°F | Resp 16 | Ht 71.25 in | Wt 193.0 lb

## 2015-06-14 DIAGNOSIS — J4520 Mild intermittent asthma, uncomplicated: Secondary | ICD-10-CM

## 2015-06-14 DIAGNOSIS — R05 Cough: Secondary | ICD-10-CM | POA: Diagnosis not present

## 2015-06-14 DIAGNOSIS — R059 Cough, unspecified: Secondary | ICD-10-CM

## 2015-06-14 MED ORDER — AZITHROMYCIN 250 MG PO TABS: ORAL_TABLET | ORAL | Status: AC

## 2015-06-14 MED ORDER — PREDNISONE 20 MG PO TABS: ORAL_TABLET | ORAL | Status: DC

## 2015-06-14 NOTE — Progress Notes (Signed)
Subjective:    Patient ID: Stephen Campbell, male    DOB: 12-18-1960, 54 y.o.   MRN: [id number]  HPI 54 y.o. WM presents with cough x 2 weeks, he states he was feeling better but then started to get worse. Has had some GERD.   Blood pressure 126/70, pulse 102, temperature 98.1 F (36.7 C), temperature source Temporal, resp. rate 16, height 5' 11.25" (1.81 m), weight 193 lb (87.544 kg), SpO2 96 %.  Past Medical History  Diagnosis Date  . Allergy   . Asthma   . Depression   . GERD (gastroesophageal reflux disease)   . ED (erectile dysfunction)   . Hypertension   . Hyperlipidemia   . Hypogonadism in male   . Osteoarthritis   . Vitamin D deficiency   . Anxiety   . Basal cell carcinoma     Current Outpatient Prescriptions on File Prior to Visit  Medication Sig Dispense Refill  . albuterol (VENTOLIN HFA) 108 (90 BASE) MCG/ACT inhaler Inhale 2 puffs into the lungs every 4 (four) hours as needed for wheezing or shortness of breath. 1 Inhaler 3  . ALPRAZolam (XANAX) 0.5 MG tablet Take 1 tablet (0.5 mg total) by mouth 2 (two) times daily as needed for anxiety or sleep. 60 tablet 0  . ANDROGEL PUMP 20.25 MG/ACT (1.62%) GEL APPLY 2 PUMPS TO EACH ARM DAILY. 150 g 0  . aspirin 81 MG chewable tablet Chew by mouth daily.    . diclofenac sodium (VOLTAREN) 1 % GEL Apply topically daily.    Marland Kitchen loratadine (CLARITIN) 10 MG tablet Take 10 mg by mouth daily.    . meloxicam (MOBIC) 15 MG tablet Take once daily as needed daily with food for pain, do not take aleve/ibuprofen with it, can take tylenol 90 tablet 0  . Omega-3 Fatty Acids (FISH OIL PO) Take by mouth daily.    . pantoprazole (PROTONIX) 40 MG tablet Take 40 mg by mouth as needed.     . triamcinolone cream (KENALOG) 0.1 % Apply 1 application topically 2 (two) times daily. 45 g 1  . TURMERIC PO Take by mouth daily.     No current facility-administered medications on file prior to visit.    Review of Systems  Constitutional: Positive for  fatigue. Negative for fever, chills and diaphoresis.  HENT: Positive for postnasal drip. Negative for congestion, ear pain, rhinorrhea, sinus pressure, sneezing, sore throat, trouble swallowing and voice change.   Eyes: Negative.   Respiratory: Positive for cough. Negative for chest tightness, shortness of breath and wheezing.   Cardiovascular: Negative.   Gastrointestinal: Negative.   Genitourinary: Negative.   Musculoskeletal: Negative.  Negative for neck pain.  Neurological: Negative.  Negative for headaches.       Objective:   Physical Exam  Constitutional: He appears well-developed and well-nourished.  HENT:  Head: Normocephalic and atraumatic.  Right Ear: External ear normal.  Left Ear: External ear normal.  Nose: Right sinus exhibits maxillary sinus tenderness. Left sinus exhibits maxillary sinus tenderness.  Mouth/Throat: Oropharynx is clear and moist.  Eyes: Conjunctivae are normal. Pupils are equal, round, and reactive to light.  Neck: Normal range of motion. Neck supple.  Cardiovascular: Normal rate, regular rhythm and normal heart sounds.   Pulmonary/Chest: Effort normal and breath sounds normal. He has no wheezes.  Abdominal: Soft. Bowel sounds are normal. There is tenderness (epigastric).  Lymphadenopathy:    He has no cervical adenopathy.  Skin: Skin is warm and dry.  Assessment & Plan:  Cough- ? Viral/residual/GERD/bacterial ? GERD- increase zantac 150 BID, do prednisone 2 3-5 days, if not better get zpak filled

## 2015-06-14 NOTE — Patient Instructions (Addendum)
Sinusitis can be uncomfortable. People with sinusitis have congestion with yellow/green/gray discharge, sinus pain/pressure, pain around the eyes. Sinus infections almost ALWAYS stem from a viral infection and antibiotics don't work against a virus. Even when bacteria is responsible, the infections usually clear up on their own in a week or so.   PLEASE TRY TO DO OVER THE COUNTER TREATMENT AND PREDNISONE FOR 5-7 DAYS AND IF YOU ARE NOT GETTING BETTER OR GETTING WORSE THEN YOU CAN START ON AN ANTIBIOTIC GIVEN.  Can take the prednisone AT NIGHT WITH DINNER, it take 8-12 hours to start working so it will NOT affect your sleeping if you take it at night with your food!! Take two pills the first night and 1 or two pill the second night and then 1 pill the other nights.   Risk of antibiotic use: About 1 in 4 people who take antibiotics have side effects including stomach problems, dizziness, or rashes. Those problems clear up soon after stopping the drugs, but in rare cases antibiotics can cause severe allergic reaction. Over use of antibiotics also encourages the growth of bacteria that can't be controlled easily with drugs. That makes you more vunerable to antibiotic-resistant infections and undermines the benefits of antibiotics for others.   Waste of Money: Antibiotics often aren't very expensive, but any money spent on unnecessary drugs is money down the drain.   When are antibiotics needed? Only when symptoms last longer than a week.  Start to improve but then worsen again  -It can take up to 2 weeks to feel better.   -If you do not get better in 7-10 days (Have fever, facial pain, dental pain and swelling), then please call the office and it is now appropriate to start an antibiotic.   -Please take Tylenol or Ibuprofen for pain. -Acetaminiphen 325mg orally every 4-6 hours for pain.  Max: 10 per day -Ibuprofen 200mg orally every 6-8 hours for pain.  Take with food to avoid ulcers.   Max 10 per  day  Please pick one of the over the counter allergy medications below and take it once daily for allergies.  Claritin or loratadine cheapest but likely the weakest  Zyrtec or certizine at night because it can make you sleepy The strongest is allegra or fexafinadine  Cheapest at [employer], [employer], [employer]  -While drinking fluids, pinch and hold nose close and swallow.  This will help open up your eustachian tubes to drain the fluid behind your ear drums. -Try steam showers to open your nasal passages.   Drink lots of water to stay hydrated and to thin mucous.  Flonase/Nasonex is to help the inflammation.  Take 2 sprays in each nostril at bedtime.  Make sure you spray towards the outside of each nostril towards the outer corner of your eye, hold nose close and tilt head back.  This will help the medication get into your sinuses.  If you do not like this medication, then use saline nasal sprays same directions as above for Flonase. Stop the medication right away if you get blurring of your vision or nose bleeds.  Sinusitis Sinusitis is redness, soreness, and inflammation of the paranasal sinuses. Paranasal sinuses are air pockets within the bones of your face (beneath the eyes, the middle of the forehead, or above the eyes). In healthy paranasal sinuses, mucus is able to drain out, and air is able to circulate through them by way of your nose. However, when your paranasal sinuses are inflamed, mucus and air can   become trapped. This can allow bacteria and other germs to grow and cause infection. Sinusitis can develop quickly and last only a short time (acute) or continue over a long period (chronic). Sinusitis that lasts for more than 12 weeks is considered chronic.  CAUSES  Causes of sinusitis include: Allergies. Structural abnormalities, such as displacement of the cartilage that separates your nostrils (deviated septum), which can decrease the air flow through your nose and sinuses and affect sinus  drainage. Functional abnormalities, such as when the small hairs (cilia) that line your sinuses and help remove mucus do not work properly or are not present. SIGNS AND SYMPTOMS  Symptoms of acute and chronic sinusitis are the same. The primary symptoms are pain and pressure around the affected sinuses. Other symptoms include: Upper toothache. Earache. Headache. Bad breath. Decreased sense of smell and taste. A cough, which worsens when you are lying flat. Fatigue. Fever. Thick drainage from your nose, which often is green and may contain pus (purulent). Swelling and warmth over the affected sinuses. DIAGNOSIS  Your health care provider will perform a physical exam. During the exam, your health care provider may: Look in your nose for signs of abnormal growths in your nostrils (nasal polyps).  Tap over the affected sinus to check for signs of infection. View the inside of your sinuses (endoscopy) using an imaging device that has a light attached (endoscope). If your health care provider suspects that you have chronic sinusitis, one or more of the following tests may be recommended: Allergy tests. Nasal culture. A sample of mucus is taken from your nose, sent to a lab, and screened for bacteria. Nasal cytology. A sample of mucus is taken from your nose and examined by your health care provider to determine if your sinusitis is related to an allergy. TREATMENT  Most cases of acute sinusitis are related to a viral infection and will resolve on their own within 10 days. Sometimes medicines are prescribed to help relieve symptoms (pain medicine, decongestants, nasal steroid sprays, or saline sprays).  However, for sinusitis related to a bacterial infection, your health care provider will prescribe antibiotic medicines. These are medicines that will help kill the bacteria causing the infection.  Rarely, sinusitis is caused by a fungal infection. In theses cases, your health care provider will  prescribe antifungal medicine. For some cases of chronic sinusitis, surgery is needed. Generally, these are cases in which sinusitis recurs more than 3 times per year, despite other treatments. HOME CARE INSTRUCTIONS  Drink plenty of water. Water helps thin the mucus so your sinuses can drain more easily. Use a humidifier. Inhale steam 3 to 4 times a day (for example, sit in the bathroom with the shower running). Apply a warm, moist washcloth to your face 3 to 4 times a day, or as directed by your health care provider. Use saline nasal sprays to help moisten and clean your sinuses. Take medicines only as directed by your health care provider. If you were prescribed either an antibiotic or antifungal medicine, finish it all even if you start to feel better. SEEK IMMEDIATE MEDICAL CARE IF: You have increasing pain or severe headaches. You have nausea, vomiting, or drowsiness. You have swelling around your face. You have vision problems. You have a stiff neck. You have difficulty breathing. MAKE SURE YOU:  Understand these instructions. Will watch your condition. Will get help right away if you are not doing well or get worse. Document Released: 06/02/2005 Document Revised: 10/17/2013 Document Reviewed: 06/17/2011 [employer]   Patient Information 2015 [employer]. This information is not intended to replace advice given to you by your health care provider. Make sure you discuss any questions you have with your health care provider.   Common causes of cough OR hoarseness OR sore throat:   Allergies, Viral Infections, Acid Reflux and Bacterial Infections.  1) Allergies and viral infections cause a cough OR sore throat by post nasal drip and are often worse at night, can also have sneezing, lower grade fevers, clear/yellow mucus. This is best treated with allergy medications or nasal sprays.  Please get on allegra for 1-2 weeks The strongest is allegra or fexafinadine  Cheapest at [employer],  [employer], [employer]  2) Bacterial infections are more severe than allergies or viral infections with fever, teeth pain, fatigue. This can be treated with prednisone and the same over the counter medication and after 7 days can be treated with an antibiotic.   3) Silent reflux/GERD can cause a cough OR sore throat OR hoarseness WITHOUT heart burn because the esophagus that goes to the stomach and trachea that goes to the lungs are very close and when you lay down the acid can irritate your throat and lungs. This can cause hoarseness, cough, and wheezing. Please stop any alcohol or anti-inflammatories like aleve/advil/ibuprofen and start an over the counter Prilosec or omeprazole 1-2 times daily 30mins before food for 2 weeks, then switch to over the counter zantac/ratinidine or pepcid/famotadine once at night for 2 weeks.   4) sometimes irritation causes more irritation. Try voice rest, use sugar free cough drops to prevent coughing, and try to stop clearing your throat.   If you ever have a cough that does not go away after trying these things please make a follow up visit for further evaluation or we can refer you to a specialist. Or if you ever have shortness of breath or chest pain go to the ER.    

## 2015-09-03 ENCOUNTER — Other Ambulatory Visit: Payer: Self-pay | Admitting: Physician Assistant

## 2015-09-03 ENCOUNTER — Other Ambulatory Visit: Payer: Self-pay | Admitting: Internal Medicine

## 2016-02-12 ENCOUNTER — Other Ambulatory Visit: Payer: Self-pay | Admitting: Internal Medicine

## 2016-02-12 NOTE — Telephone Encounter (Signed)
Rx called in to [facility]. ?

## 2016-02-14 ENCOUNTER — Encounter: Payer: Self-pay | Admitting: Physician Assistant

## 2016-02-14 ENCOUNTER — Ambulatory Visit (INDEPENDENT_AMBULATORY_CARE_PROVIDER_SITE_OTHER): Payer: 59 | Admitting: Physician Assistant

## 2016-02-14 VITALS — Resp 16 | Ht 72.0 in | Wt 190.0 lb

## 2016-02-14 DIAGNOSIS — Z79899 Other long term (current) drug therapy: Secondary | ICD-10-CM

## 2016-02-14 DIAGNOSIS — Z136 Encounter for screening for cardiovascular disorders: Secondary | ICD-10-CM | POA: Diagnosis not present

## 2016-02-14 DIAGNOSIS — F329 Major depressive disorder, single episode, unspecified: Secondary | ICD-10-CM

## 2016-02-14 DIAGNOSIS — F32A Depression, unspecified: Secondary | ICD-10-CM

## 2016-02-14 DIAGNOSIS — R6889 Other general symptoms and signs: Secondary | ICD-10-CM | POA: Diagnosis not present

## 2016-02-14 DIAGNOSIS — M19019 Primary osteoarthritis, unspecified shoulder: Secondary | ICD-10-CM

## 2016-02-14 DIAGNOSIS — Z85828 Personal history of other malignant neoplasm of skin: Secondary | ICD-10-CM

## 2016-02-14 DIAGNOSIS — Z125 Encounter for screening for malignant neoplasm of prostate: Secondary | ICD-10-CM

## 2016-02-14 DIAGNOSIS — Z0001 Encounter for general adult medical examination with abnormal findings: Secondary | ICD-10-CM

## 2016-02-14 DIAGNOSIS — L409 Psoriasis, unspecified: Secondary | ICD-10-CM

## 2016-02-14 DIAGNOSIS — I10 Essential (primary) hypertension: Secondary | ICD-10-CM

## 2016-02-14 DIAGNOSIS — E559 Vitamin D deficiency, unspecified: Secondary | ICD-10-CM

## 2016-02-14 DIAGNOSIS — K2100 Gastro-esophageal reflux disease with esophagitis, without bleeding: Secondary | ICD-10-CM

## 2016-02-14 DIAGNOSIS — N529 Male erectile dysfunction, unspecified: Secondary | ICD-10-CM

## 2016-02-14 DIAGNOSIS — E291 Testicular hypofunction: Secondary | ICD-10-CM

## 2016-02-14 DIAGNOSIS — E785 Hyperlipidemia, unspecified: Secondary | ICD-10-CM

## 2016-02-14 DIAGNOSIS — J4520 Mild intermittent asthma, uncomplicated: Secondary | ICD-10-CM

## 2016-02-14 DIAGNOSIS — T7840XS Allergy, unspecified, sequela: Secondary | ICD-10-CM

## 2016-02-14 LAB — LIPID PANEL
Cholesterol: 174 mg/dL (ref 125–200)
HDL: 45 mg/dL (ref >=40)
LDL CALC: 99 mg/dL (ref <130)
TRIGLYCERIDES: 152 mg/dL — AB (ref <150)
Total CHOL/HDL Ratio: 3.9 Ratio (ref <=5.0)
VLDL: 30 mg/dL (ref <30)

## 2016-02-14 LAB — CBC WITH DIFFERENTIAL/PLATELET
BASOS ABS: 60 {cells}/uL (ref 0–200)
Basophils Relative: 1 %
EOS ABS: 180 {cells}/uL (ref 15–500)
Eosinophils Relative: 3 %
HEMATOCRIT: 45.4 % (ref 38.5–50.0)
HEMOGLOBIN: 15.7 g/dL (ref 13.2–17.1)
LYMPHS ABS: 1620 {cells}/uL (ref 850–3900)
LYMPHS PCT: 27 %
MCH: 31.5 pg (ref 27.0–33.0)
MCHC: 34.6 g/dL (ref 32.0–36.0)
MCV: 91 fL (ref 80.0–100.0)
MONO ABS: 540 {cells}/uL (ref 200–950)
MPV: 11.1 fL (ref 7.5–12.5)
Monocytes Relative: 9 %
NEUTROS PCT: 60 %
Neutro Abs: 3600 cells/uL (ref 1500–7800)
Platelets: 228 10*3/uL (ref 140–400)
RBC: 4.99 MIL/uL (ref 4.20–5.80)
RDW: 13 % (ref 11.0–15.0)
WBC: 6 10*3/uL (ref 3.8–10.8)

## 2016-02-14 LAB — BASIC METABOLIC PANEL WITH GFR
BUN: 15 mg/dL (ref 7–25)
CALCIUM: 9.6 mg/dL (ref 8.6–10.3)
CO2: 24 mmol/L (ref 20–31)
Chloride: 104 mmol/L (ref 98–110)
Creat: 0.95 mg/dL (ref 0.70–1.33)
GFR, Est Non African American: >89 mL/min (ref >=60)
GLUCOSE: 90 mg/dL (ref 65–99)
Potassium: 4.2 mmol/L (ref 3.5–5.3)
Sodium: 141 mmol/L (ref 135–146)

## 2016-02-14 LAB — HEPATIC FUNCTION PANEL
ALBUMIN: 4.2 g/dL (ref 3.6–5.1)
ALK PHOS: 38 U/L — AB (ref 40–115)
ALT: 48 U/L — AB (ref 9–46)
AST: 34 U/L (ref 10–35)
BILIRUBIN INDIRECT: 0.9 mg/dL (ref 0.2–1.2)
Bilirubin, Direct: 0.2 mg/dL (ref <=0.2)
Total Bilirubin: 1.1 mg/dL (ref 0.2–1.2)
Total Protein: 7.3 g/dL (ref 6.1–8.1)

## 2016-02-14 LAB — MAGNESIUM: Magnesium: 1.8 mg/dL (ref 1.5–2.5)

## 2016-02-14 LAB — TSH: TSH: 1.27 mIU/L (ref 0.40–4.50)

## 2016-02-14 LAB — TESTOSTERONE: TESTOSTERONE: 453 ng/dL (ref 250–827)

## 2016-02-14 LAB — PSA: PSA: 1.1 ng/mL (ref <=4.0)

## 2016-02-14 MED ORDER — DICLOFENAC EPOLAMINE 1.3 % TD PTCH: 1.0000 | MEDICATED_PATCH | Freq: Two times a day (BID) | TRANSDERMAL | 2 refills | Status: DC

## 2016-02-14 NOTE — Progress Notes (Signed)
Complete Physical  Assessment and Plan: Essential hypertension - monitor at home, if still elevated we will send in alpha blocker, DASH diet, exercise and monitor at home. Call if greater than 130/80.  - CBC with Differential/Platelet - BASIC METABOLIC PANEL WITH GFR - Hepatic function panel - TSH - Urinalysis, Routine w reflex microscopic (not at [facility]) - Microalbumin / creatinine urine ratio - EKG 12-Lead  Hyperlipidemia -continue medications, check lipids, decrease fatty foods, increase activity.  - Lipid panel  Hypogonadism male Hypogonadism- continue replacement therapy, check testosterone levels as needed.  - Testosterone  Asthma, mild intermittent, uncomplicated Resent in inhaler  Depression, remission Resent in xanax, takes rarely  Gastroesophageal reflux disease with esophagitis GERD- will try to get off PPiI given info for taper and zantac sent in  Erectile dysfunction, unspecified erectile dysfunction type meds PRN  Vitamin D deficiency - Vit D  25 hydroxy (rtn osteoporosis monitoring)  Medication management - Magnesium   History of basal cell carcinoma Follows Derm   Psoriasis Follows rheum.   Encounter for general adult medical examination with abnormal findings - CBC with Differential/Platelet - BASIC METABOLIC PANEL WITH GFR - Hepatic function panel - TSH - Lipid panel - Hemoglobin A1c - Insulin, fasting - Vit D  25 hydroxy (rtn osteoporosis monitoring) - Magnesium - Urinalysis, Routine w reflex microscopic (not at [facility]) - Microalbumin / creatinine urine ratio - Iron and TIBC - Ferritin - Vitamin B12 - PSA - Testosterone - EKG 12-Lead - Hepatitis C antibody - HIV antibody  Screening PSA (prostate specific antigen) - PSA  Plantar fasciitis  Try the patches for acute pain   Discussed med's effects and SE's. Screening labs and tests as requested with regular follow-up as recommended.  No future appointments.  HPI  His blood  pressure has been controlled at home, today their BP is BP: (P) 136/90, rechecked 140/80.  He does workout. He denies chest pain, shortness of breath, dizziness.  He has history of joint pain, has psoriasis and possible psoriatic arthritis, followed with Dr. Patrecia Pour, at this time he is only doing topical treatment and mobic PRN, no DMARD. He has been having intermittent neck pain with some numbness at left arm to pinky, wife is PT and will help with treatment.  He is on cholesterol medication, lipitor but stopped taking it and denies myalgias. His cholesterol is at goal. The cholesterol last visit was:   Lab Results  Component Value Date   CHOL 209 (H) 08/18/2014   HDL 40 08/18/2014   LDLCALC 123 (H) 08/18/2014   TRIG 228 (H) 08/18/2014   CHOLHDL 5.2 08/18/2014    Last A1C in the office was:  Lab Results  Component Value Date   HGBA1C 5.4 02/09/2015   He is has GERD, takes protonix when he travels.  He has a history of testosterone deficiency and is on testosterone replacement. He states that the testosterone helps with his energy, libido, muscle mass. Lab Results  Component Value Date   TESTOSTERONE 252 (L) 02/09/2015   Lab Results  Component Value Date   PSA 1.58 02/09/2015   PSA 1.22 02/03/2014   Patient is on Vitamin D supplement.   Lab Results  Component Value Date   VD25OH 34 02/09/2015      Current Medications:  Current Outpatient Prescriptions on File Prior to Visit  Medication Sig Dispense Refill  . albuterol (VENTOLIN HFA) 108 (90 BASE) MCG/ACT inhaler Inhale 2 puffs into the lungs every 4 (four) hours as needed for  wheezing or shortness of breath. 1 Inhaler 3  . ANDROGEL PUMP 20.25 MG/ACT (1.62%) GEL APPLY 2 PUMPS TO EACH ARM DAILY. 150 g 0  . aspirin 81 MG chewable tablet Chew by mouth daily.    . diclofenac sodium (VOLTAREN) 1 % GEL Apply topically daily.    Marland Kitchen loratadine (CLARITIN) 10 MG tablet Take 10 mg by mouth daily.    . meloxicam (MOBIC) 15 MG tablet  TAKE 1 TABLET ONCE DAILY AS NEEDED FOR PAIN WITH FOOD. 31 tablet 0  . Omega-3 Fatty Acids (FISH OIL PO) Take by mouth daily.    Marland Kitchen triamcinolone cream (KENALOG) 0.1 % Apply 1 application topically 2 (two) times daily. 45 g 1  . TURMERIC PO Take by mouth daily.     No current facility-administered medications on file prior to visit.    Health Maintenance:  Immunization History  Administered Date(s) Administered  . Influenza Split 03/28/2013, 03/30/2014  . Influenza, Seasonal, Injecte, Preservative Fre 05/04/2015   Tetanus: 2008 Pneumovax: Prevnar 13:  Flu vaccine: 2015 Zostavax: DEXA: Colonoscopy: 02/23/2015 EGD:  Medical History:  Patient Active Problem List   Diagnosis Date Noted  . History of basal cell carcinoma 02/09/2015  . Psoriasis 02/09/2015  . Hypogonadism male 08/18/2014  . 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 Hypertension   . Hyperlipidemia   . Allergy   . Asthma   . Depression   . GERD (gastroesophageal reflux disease)   . ED (erectile dysfunction)   . Plantar fasciitis 01/06/2012   Allergies No Known Allergies  SURGICAL HISTORY He  has a past surgical history that includes Vasectomy (2013); orthopedic surgery; Hernia repair; and LASIK. FAMILY HISTORY His family history includes Asthma in his father; Heart disease in his father and mother; Kidney disease in his father; Spondylitis in his brother; Uterine cancer in his mother. SOCIAL HISTORY He  reports that he has quit smoking. His smoking use included Cigars. He has never used smokeless tobacco. He reports that he drinks alcohol. He reports that he does not use drugs.  Review of Systems  Constitutional: Negative.   HENT: Negative.   Eyes: Negative.   Respiratory: Negative.   Cardiovascular: Negative.   Gastrointestinal: Positive for heartburn. Negative for abdominal pain, blood in stool, constipation, diarrhea, melena, nausea and vomiting.  Genitourinary: Negative.        Dribbling  Musculoskeletal:  Positive for back pain and joint pain. Negative for falls, myalgias and neck pain.  Skin: Negative.   Neurological: Negative for dizziness, tingling (bilateral hands at night), tremors, sensory change, speech change, focal weakness, seizures and loss of consciousness.  Psychiatric/Behavioral: Negative.     Physical Exam: Estimated body mass index is 25.77 kg/m as calculated from the following:   Height as of this encounter: 6' (1.829 m).   Weight as of this encounter: 190 lb (86.2 kg). BP (P) 136/90   Pulse (P) 86   Temp (P) 97.3 F (36.3 C)   Resp 16   Ht 6' (1.829 m) Comment: w/ shoes  Wt 190 lb (86.2 kg)   SpO2 95%   BMI 25.77 kg/m  General Appearance: Well nourished, in no apparent distress. Eyes: PERRLA, EOMs, conjunctiva no swelling or erythema, normal fundi and vessels. Sinuses: No Frontal/maxillary tenderness ENT/Mouth: Ext aud canals clear, normal light reflex with TMs without erythema, bulging. Good dentition. No erythema, swelling, or exudate on post pharynx. Tonsils not swollen or erythematous. Hearing normal.  Neck: Supple, thyroid normal. No bruits Respiratory: Respiratory effort normal, BS equal  bilaterally without rales, rhonchi, wheezing or stridor. Cardio: RRR without murmurs, rubs or gallops. Brisk peripheral pulses without edema.  Chest: symmetric, with normal excursions and percussion. Abdomen: Soft, +BS. Non tender, no guarding, rebound, hernias, masses, or organomegaly.  Lymphatics: Non tender without lymphadenopathy.  Genitourinary: defer Musculoskeletal: Full ROM all peripheral extremities,5/5 strength, and normal gait. Skin: Warm, dry without rashes, lesions, ecchymosis.  Neuro: Cranial nerves intact, reflexes equal bilaterally. Normal muscle tone, no cerebellar symptoms. Sensation intact.  Psych: Awake and oriented X 3, normal affect, Insight and Judgment appropriate.   EKG: WNL, PRWP  no changes. AORTA SCAN: defer  [name] 2:25 PM

## 2016-02-15 ENCOUNTER — Encounter: Payer: Self-pay | Admitting: Physician Assistant

## 2016-02-15 LAB — URINALYSIS, ROUTINE W REFLEX MICROSCOPIC
Bilirubin Urine: NEGATIVE
Glucose, UA: NEGATIVE
Hgb urine dipstick: NEGATIVE
Ketones, ur: NEGATIVE
Leukocytes, UA: NEGATIVE
NITRITE: NEGATIVE
PROTEIN: NEGATIVE
SPECIFIC GRAVITY, URINE: 1.024 (ref 1.001–1.035)
pH: 6 (ref 5.0–8.0)

## 2016-02-15 LAB — VITAMIN D 25 HYDROXY (VIT D DEFICIENCY, FRACTURES): VIT D 25 HYDROXY: 45 ng/mL (ref 30–100)

## 2016-02-15 LAB — MICROALBUMIN / CREATININE URINE RATIO
CREATININE, URINE: 275 mg/dL (ref 20–370)
MICROALB UR: 0.7 mg/dL
Microalb Creat Ratio: 3 mcg/mg creat (ref <30)

## 2016-06-06 ENCOUNTER — Encounter: Payer: Self-pay | Admitting: Internal Medicine

## 2016-06-06 ENCOUNTER — Ambulatory Visit (INDEPENDENT_AMBULATORY_CARE_PROVIDER_SITE_OTHER): Payer: 59 | Admitting: Internal Medicine

## 2016-06-06 VITALS — BP 136/84 | HR 82 | Temp 98.2°F | Resp 16 | Ht 72.0 in | Wt 190.0 lb

## 2016-06-06 DIAGNOSIS — K219 Gastro-esophageal reflux disease without esophagitis: Secondary | ICD-10-CM

## 2016-06-06 DIAGNOSIS — K449 Diaphragmatic hernia without obstruction or gangrene: Secondary | ICD-10-CM | POA: Diagnosis not present

## 2016-06-06 MED ORDER — RANITIDINE HCL 300 MG PO TABS: 300.0000 mg | ORAL_TABLET | Freq: Every day | ORAL | 11 refills | Status: DC

## 2016-06-06 MED ORDER — LIDOCAINE VISCOUS 2 % MT SOLN: 20.0000 mL | OROMUCOSAL | 0 refills | Status: DC | PRN

## 2016-06-10 NOTE — Progress Notes (Signed)
HPI  Patient complains of Sore throat and acid reflux. Onset was 1 month ago.  Patient has a history of acid reflux and also has a hiatal hernia. Symptoms have been gradually worsening. The pain is described as burning and stabbing. Pain is located in the epigastric region without radiation.  Aggravating factors: alcohol, coffee and recumbency.  Alleviating factors: antacids. Associated symptoms: belching, water brash, epigastric pain.. The patient denies anorexia, chills, fever, flatus, frequency, hematochezia, hematuria, melena, nausea, vomiting and difficulty swallowing.   Review of Systems  Constitutional: Negative for chills, fever and malaise/fatigue.  HENT: Negative for congestion, ear pain, hearing loss and sore throat.   Respiratory: Positive for cough. Negative for shortness of breath and wheezing.   Cardiovascular: Negative for chest pain, palpitations and leg swelling.  Gastrointestinal: Positive for abdominal pain and heartburn. Negative for blood in stool, constipation, diarrhea, melena, nausea and vomiting.  Genitourinary: Negative.     Physical exam:  Vitals:   06/06/16 1001  BP: 136/84  Pulse: 82  Resp: 16  Temp: 98.2 F (36.8 C)    General:  Well developed well nourished, non-toxic appearing, NAD Head:  NCAT, PERLA, normal EOM, conjunctiva normal, ears clear bilaterally with normal TMs, Oropharynx clear and moist without exudate, no oropharyngeal erythema or swelling Neck:  No JVD, no cervical adenopathy, no thyromegaly Lungs:  Clear to auscultation A&P, no wheeze, rhonchi, rales.  Normal effort Heart:  RRR, no MRGs, normal peripheral pulses Abd:  +BS x 4, no distention, soft, non-tender, with no guarding, rigidity, or rebound.   GU:  No CVA tenderness bilaterally Skin:  No rash, warm and dry Neuro:  A&O x 3, CN II-XII intact, normal gait Psych:  Normal affect, no ideations, normal judgement and insight  Assessment and Plan:   1. Gastroesophageal reflux disease  without esophagitis -2 weeks of dexilant -ranitidine 300 mg bid -carafate samples given -viscous lidocaine prn  2. Hiatal hernia -see above -may likely need to have lifetime dexilant

## 2016-06-18 ENCOUNTER — Other Ambulatory Visit: Payer: Self-pay | Admitting: Internal Medicine

## 2016-06-24 DIAGNOSIS — J3089 Other allergic rhinitis: Secondary | ICD-10-CM | POA: Diagnosis not present

## 2016-06-24 DIAGNOSIS — J301 Allergic rhinitis due to pollen: Secondary | ICD-10-CM | POA: Diagnosis not present

## 2016-06-24 DIAGNOSIS — J3081 Allergic rhinitis due to animal (cat) (dog) hair and dander: Secondary | ICD-10-CM | POA: Diagnosis not present

## 2016-07-15 ENCOUNTER — Encounter: Payer: Self-pay | Admitting: Internal Medicine

## 2016-07-15 DIAGNOSIS — J3089 Other allergic rhinitis: Secondary | ICD-10-CM | POA: Diagnosis not present

## 2016-07-15 DIAGNOSIS — J3081 Allergic rhinitis due to animal (cat) (dog) hair and dander: Secondary | ICD-10-CM | POA: Diagnosis not present

## 2016-07-15 DIAGNOSIS — J301 Allergic rhinitis due to pollen: Secondary | ICD-10-CM | POA: Diagnosis not present

## 2016-08-01 DIAGNOSIS — J301 Allergic rhinitis due to pollen: Secondary | ICD-10-CM | POA: Diagnosis not present

## 2016-08-01 DIAGNOSIS — J3089 Other allergic rhinitis: Secondary | ICD-10-CM | POA: Diagnosis not present

## 2016-08-01 DIAGNOSIS — J3081 Allergic rhinitis due to animal (cat) (dog) hair and dander: Secondary | ICD-10-CM | POA: Diagnosis not present

## 2016-08-15 ENCOUNTER — Ambulatory Visit (INDEPENDENT_AMBULATORY_CARE_PROVIDER_SITE_OTHER): Payer: 59 | Admitting: Physician Assistant

## 2016-08-15 ENCOUNTER — Encounter: Payer: Self-pay | Admitting: Physician Assistant

## 2016-08-15 VITALS — BP 124/78 | HR 73 | Temp 97.3°F | Resp 14 | Ht 72.0 in | Wt 195.2 lb

## 2016-08-15 DIAGNOSIS — E559 Vitamin D deficiency, unspecified: Secondary | ICD-10-CM

## 2016-08-15 DIAGNOSIS — D649 Anemia, unspecified: Secondary | ICD-10-CM

## 2016-08-15 DIAGNOSIS — I10 Essential (primary) hypertension: Secondary | ICD-10-CM | POA: Diagnosis not present

## 2016-08-15 DIAGNOSIS — Z79899 Other long term (current) drug therapy: Secondary | ICD-10-CM | POA: Diagnosis not present

## 2016-08-15 DIAGNOSIS — J301 Allergic rhinitis due to pollen: Secondary | ICD-10-CM | POA: Diagnosis not present

## 2016-08-15 DIAGNOSIS — J3081 Allergic rhinitis due to animal (cat) (dog) hair and dander: Secondary | ICD-10-CM | POA: Diagnosis not present

## 2016-08-15 DIAGNOSIS — J3089 Other allergic rhinitis: Secondary | ICD-10-CM | POA: Diagnosis not present

## 2016-08-15 DIAGNOSIS — E785 Hyperlipidemia, unspecified: Secondary | ICD-10-CM | POA: Diagnosis not present

## 2016-08-15 LAB — BASIC METABOLIC PANEL WITH GFR
BUN: 15 mg/dL (ref 7–25)
CALCIUM: 9.7 mg/dL (ref 8.6–10.3)
CHLORIDE: 105 mmol/L (ref 98–110)
CO2: 26 mmol/L (ref 20–31)
CREATININE: 0.94 mg/dL (ref 0.70–1.33)
GFR, Est African American: >89 mL/min (ref >=60)
GFR, Est Non African American: >89 mL/min (ref >=60)
GLUCOSE: 89 mg/dL (ref 65–99)
Potassium: 4.6 mmol/L (ref 3.5–5.3)
SODIUM: 141 mmol/L (ref 135–146)

## 2016-08-15 LAB — HEPATIC FUNCTION PANEL
ALBUMIN: 4.4 g/dL (ref 3.6–5.1)
ALT: 73 U/L — ABNORMAL HIGH (ref 9–46)
AST: 43 U/L — AB (ref 10–35)
Alkaline Phosphatase: 39 U/L — ABNORMAL LOW (ref 40–115)
BILIRUBIN DIRECT: 0.2 mg/dL (ref <=0.2)
Indirect Bilirubin: 0.5 mg/dL (ref 0.2–1.2)
TOTAL PROTEIN: 7.4 g/dL (ref 6.1–8.1)
Total Bilirubin: 0.7 mg/dL (ref 0.2–1.2)

## 2016-08-15 LAB — IRON AND TIBC
%SAT: 33 % (ref 15–60)
IRON: 95 ug/dL (ref 50–180)
TIBC: 291 ug/dL (ref 250–425)
UIBC: 196 ug/dL (ref 125–400)

## 2016-08-15 LAB — CBC WITH DIFFERENTIAL/PLATELET
BASOS ABS: 59 {cells}/uL (ref 0–200)
Basophils Relative: 1 %
EOS ABS: 354 {cells}/uL (ref 15–500)
EOS PCT: 6 %
HEMATOCRIT: 48.2 % (ref 38.5–50.0)
HEMOGLOBIN: 16.4 g/dL (ref 13.2–17.1)
Lymphocytes Relative: 29 %
Lymphs Abs: 1711 cells/uL (ref 850–3900)
MCH: 31.4 pg (ref 27.0–33.0)
MCHC: 34 g/dL (ref 32.0–36.0)
MCV: 92.3 fL (ref 80.0–100.0)
MPV: 11 fL (ref 7.5–12.5)
Monocytes Absolute: 590 cells/uL (ref 200–950)
Monocytes Relative: 10 %
NEUTROS ABS: 3186 {cells}/uL (ref 1500–7800)
NEUTROS PCT: 54 %
Platelets: 247 10*3/uL (ref 140–400)
RBC: 5.22 MIL/uL (ref 4.20–5.80)
RDW: 13 % (ref 11.0–15.0)
WBC: 5.9 10*3/uL (ref 3.8–10.8)

## 2016-08-15 LAB — LIPID PANEL
CHOL/HDL RATIO: 5.9 ratio — AB (ref <5.0)
CHOLESTEROL: 226 mg/dL — AB (ref <200)
HDL: 38 mg/dL — ABNORMAL LOW (ref >40)
LDL Cholesterol: 162 mg/dL — ABNORMAL HIGH (ref <100)
Triglycerides: 132 mg/dL (ref <150)
VLDL: 26 mg/dL (ref <30)

## 2016-08-15 LAB — FERRITIN: FERRITIN: 244 ng/mL (ref 20–380)

## 2016-08-15 LAB — TSH: TSH: 1.25 m[IU]/L (ref 0.40–4.50)

## 2016-08-15 NOTE — Progress Notes (Signed)
Assessment and Plan:  Hypertension: Continue medication, monitor blood pressure at home. Continue DASH diet.  Reminder to go to the ER if any CP, SOB, nausea, dizziness, severe HA, changes vision/speech, left arm numbness and tingling, and jaw pain. Cholesterol: Continue diet and exercise. Check cholesterol.  Hypogonadism- continue to monitor, states medication is not helping with symptoms of low T, will try androgel Vitamin D Def- check level and continue medications.  GERD- stop tumeric.   Continue diet and meds as discussed. Further disposition pending results of labs. Future Appointments Date Time Provider [place]  02/20/2017 9:30 AM [name], PA-C [facility] None    HPI 56 y.o. male  presents for 3 month follow up with hypertension, hyperlipidemia, prediabetes and vitamin D.  His blood pressure has been controlled at home, today their BP is BP: 124/78  He does workout. He denies chest pain, shortness of breath, dizziness.  He is on cholesterol medication and denies myalgias. His cholesterol is at goal. The cholesterol last visit was:  Lab Results  Component Value Date   CHOL 174 02/14/2016   HDL 45 02/14/2016   LDLCALC 99 02/14/2016   TRIG 152 (H) 02/14/2016   CHOLHDL 3.9 02/14/2016   Last A1C in the office was:  Lab Results  Component Value Date   HGBA1C 5.4 02/09/2015  Patient is on Vitamin D supplement.   Lab Results  Component Value Date   VD25OH 45 02/14/2016   He has a history of testosterone deficiency and is on testosterone replacement, testim 1 tube a day but states that it is not helping energy, libido and would like to try another gel..  Lab Results  Component Value Date   TESTOSTERONE 453 02/14/2016    Have had worsening gerd x 3-4 months, on prilosec, new start tumeric.  Has some waking up frequently, will wake self up at night with snoring occ, will get sleep study form work  Current Medications:  Current Outpatient Prescriptions on File  Prior to Visit  Medication Sig Dispense Refill  . albuterol (VENTOLIN HFA) 108 (90 BASE) MCG/ACT inhaler Inhale 2 puffs into the lungs every 4 (four) hours as needed for wheezing or shortness of breath. 1 Inhaler 3  . ANDROGEL PUMP 20.25 MG/ACT (1.62%) GEL APPLY 2 PUMPS TO EACH ARM DAILY. 150 g 0  . aspirin 81 MG chewable tablet Chew by mouth daily.    . diclofenac (FLECTOR) 1.3 % PTCH Place 1 patch onto the skin 2 (two) times daily. 30 patch 2  . diclofenac sodium (VOLTAREN) 1 % GEL Apply topically daily.    Marland Kitchen loratadine (CLARITIN) 10 MG tablet Take 10 mg by mouth daily.    . meloxicam (MOBIC) 15 MG tablet TAKE 1 TABLET ONCE DAILY AS NEEDED FOR PAIN WITH FOOD. 31 tablet 0  . Omega-3 Fatty Acids (FISH OIL PO) Take by mouth daily.    Marland Kitchen omeprazole (PRILOSEC) 20 MG capsule Take 20 mg by mouth daily as needed.    . ranitidine (ZANTAC) 300 MG tablet Take 1 tablet (300 mg total) by mouth at bedtime. 30 tablet 11  . triamcinolone cream (KENALOG) 0.1 % Apply 1 application topically 2 (two) times daily. 45 g 1  . TURMERIC PO Take by mouth daily.     No current facility-administered medications on file prior to visit.    Medical History:  Past Medical History:  Diagnosis Date  . Allergy   . Anxiety   . Asthma   . Basal cell carcinoma   .  Depression   . ED (erectile dysfunction)   . GERD (gastroesophageal reflux disease)   . Hyperlipidemia   . Hypertension   . Hypogonadism in male   . Osteoarthritis   . Vitamin D deficiency    Allergies: No Known Allergies   Review of Systems:  Review of Systems  Constitutional: Negative.   HENT: Negative.   Respiratory: Negative.   Cardiovascular: Negative.   Gastrointestinal: Positive for heartburn (depending on what he eats). Negative for abdominal pain, blood in stool, constipation, diarrhea, melena, nausea and vomiting.  Genitourinary: Negative.   Musculoskeletal: Positive for joint pain. Negative for back pain, falls, myalgias and neck pain.   Skin: Positive for rash. Negative for itching.  Neurological: Negative.   Psychiatric/Behavioral: Negative.     Family history- Review and unchanged Social history- Review and unchanged Physical Exam: BP 124/78   Pulse 73   Temp 97.3 F (36.3 C)   Resp 14   Ht 6' (1.829 m)   Wt 195 lb 3.2 oz (88.5 kg)   SpO2 98%   BMI 26.47 kg/m  Wt Readings from Last 3 Encounters:  08/15/16 195 lb 3.2 oz (88.5 kg)  06/06/16 190 lb (86.2 kg)  02/14/16 190 lb (86.2 kg)   General Appearance: Well nourished, in no apparent distress. Eyes: PERRLA, EOMs, conjunctiva no swelling or erythema Sinuses: No Frontal/maxillary tenderness ENT/Mouth: Ext aud canals clear, TMs without erythema, bulging. No erythema, swelling, or exudate on post pharynx.  Tonsils not swollen or erythematous. Hearing normal.  Neck: Supple, thyroid normal.  Respiratory: Respiratory effort normal, BS equal bilaterally without rales, rhonchi, wheezing or stridor.  Cardio: RRR with no MRGs. Brisk peripheral pulses without edema.  Abdomen: Soft, + BS,  Non tender, no guarding, rebound, hernias, masses. Lymphatics: Non tender without lymphadenopathy.  Musculoskeletal: Full ROM, 5/5 strength, Normal gait Skin:  Warm, dry without , lesions, ecchymosis.  Neuro: Cranial nerves intact. Normal muscle tone, no cerebellar symptoms. Psych: Awake and oriented X 3, normal affect, Insight and Judgment appropriate.    [name], PA-C 11:33 AM [facility]

## 2016-08-15 NOTE — Patient Instructions (Addendum)
Stop tumeric Continue prilosec x 2 weeks See if this helps  Try the melatonin 5mg -20mg  dissolvable or gummy 30 mins before bed    GETTING OFF OF PPI's    Nexium/protonix/prilosec/Omeprazole/Dexilant/Aciphex are called PPI's, they are great at healing your stomach but should only be taken for a short period of time.     Recent studies have shown that taken for a long time they  can increase the risk of osteoporosis (weakening of your bones), pneumonia, low magnesium, restless legs, Cdiff (infection that causes diarrhea), DEMENTIA and most recently kidney damage / disease / insufficiency.     Due to this information we want to try to stop the PPI but if you try to stop it abruptly this can cause rebound acid and worsening symptoms.   So this is how we want you to get off the PPI:  - Start taking the nexium/protonix/prilosec/PPI  every other day with  zantac (ranitidine) 2 x a day for 2-4 weeks - some people stay on this dosage and can not taper off further. Our main goal is to limit the dosage and amount you are taking so if you need to stay on this dose.   - then decrease the PPI to every 3 days while taking the zantac (ranitidine) 300mg  twice a day the other  days for 2-4  Weeks  - then you can try the zantac (ranitidine) 300mg  once at night or up to 2 x day as needed.  - you can continue on this once at night or stop all together  - Avoid alcohol, spicy foods, NSAIDS (aleve, ibuprofen) at this time. See foods below.   +++++++++++++++++++++++++++++++++++++++++++  Food Choices for Gastroesophageal Reflux Disease  When you have gastroesophageal reflux disease (GERD), the foods you eat and your eating habits are very important. Choosing the right foods can help ease the discomfort of GERD. WHAT GENERAL GUIDELINES DO I NEED TO FOLLOW?  Choose fruits, vegetables, whole grains, low-fat dairy products, and low-fat meat, fish, and poultry.  Limit fats such as oils, salad dressings,  butter, nuts, and avocado.  Keep a food diary to identify foods that cause symptoms.  Avoid foods that cause reflux. These may be different for different people.  Eat frequent small meals instead of three large meals each day.  Eat your meals slowly, in a relaxed setting.  Limit fried foods.  Cook foods using methods other than frying.  Avoid drinking alcohol.  Avoid drinking large amounts of liquids with your meals.  Avoid bending over or lying down until 2-3 hours after eating.   WHAT FOODS ARE NOT RECOMMENDED? The following are some foods and drinks that may worsen your symptoms:  Vegetables Tomatoes. Tomato juice. Tomato and spaghetti sauce. Chili peppers. Onion and garlic. Horseradish. Fruits Oranges, grapefruit, and lemon (fruit and juice). Meats High-fat meats, fish, and poultry. This includes hot dogs, ribs, ham, sausage, salami, and bacon. Dairy Whole milk and chocolate milk. Sour cream. Cream. Butter. Ice cream. Cream cheese.  Beverages Coffee and tea, with or without caffeine. Carbonated beverages or energy drinks. Condiments Hot sauce. Barbecue sauce.  Sweets/Desserts Chocolate and cocoa. Donuts. Peppermint and spearmint. Fats and Oils High-fat foods, including [place] fries and potato chips. Other Vinegar. Strong spices, such as black pepper, white pepper, red pepper, cayenne, curry powder, cloves, ginger, and chili powder. Nexium/protonix/prilosec are called PPI's, they are great at healing your stomach but should only be taken for a short period of time.

## 2016-08-16 LAB — MAGNESIUM: MAGNESIUM: 1.8 mg/dL (ref 1.5–2.5)

## 2016-08-29 DIAGNOSIS — J3081 Allergic rhinitis due to animal (cat) (dog) hair and dander: Secondary | ICD-10-CM | POA: Diagnosis not present

## 2016-08-29 DIAGNOSIS — J3089 Other allergic rhinitis: Secondary | ICD-10-CM | POA: Diagnosis not present

## 2016-08-29 DIAGNOSIS — J301 Allergic rhinitis due to pollen: Secondary | ICD-10-CM | POA: Diagnosis not present

## 2016-09-17 DIAGNOSIS — J3089 Other allergic rhinitis: Secondary | ICD-10-CM | POA: Diagnosis not present

## 2016-09-17 DIAGNOSIS — J3081 Allergic rhinitis due to animal (cat) (dog) hair and dander: Secondary | ICD-10-CM | POA: Diagnosis not present

## 2016-09-17 DIAGNOSIS — J301 Allergic rhinitis due to pollen: Secondary | ICD-10-CM | POA: Diagnosis not present

## 2016-09-22 DIAGNOSIS — J3081 Allergic rhinitis due to animal (cat) (dog) hair and dander: Secondary | ICD-10-CM | POA: Diagnosis not present

## 2016-09-22 DIAGNOSIS — J3089 Other allergic rhinitis: Secondary | ICD-10-CM | POA: Diagnosis not present

## 2016-09-22 DIAGNOSIS — J301 Allergic rhinitis due to pollen: Secondary | ICD-10-CM | POA: Diagnosis not present

## 2016-10-08 DIAGNOSIS — J301 Allergic rhinitis due to pollen: Secondary | ICD-10-CM | POA: Diagnosis not present

## 2016-10-08 DIAGNOSIS — J3081 Allergic rhinitis due to animal (cat) (dog) hair and dander: Secondary | ICD-10-CM | POA: Diagnosis not present

## 2016-10-08 DIAGNOSIS — J3089 Other allergic rhinitis: Secondary | ICD-10-CM | POA: Diagnosis not present

## 2016-10-17 DIAGNOSIS — J301 Allergic rhinitis due to pollen: Secondary | ICD-10-CM | POA: Diagnosis not present

## 2016-10-17 DIAGNOSIS — J3081 Allergic rhinitis due to animal (cat) (dog) hair and dander: Secondary | ICD-10-CM | POA: Diagnosis not present

## 2016-10-17 DIAGNOSIS — J3089 Other allergic rhinitis: Secondary | ICD-10-CM | POA: Diagnosis not present

## 2016-10-24 DIAGNOSIS — J301 Allergic rhinitis due to pollen: Secondary | ICD-10-CM | POA: Diagnosis not present

## 2016-10-24 DIAGNOSIS — J3089 Other allergic rhinitis: Secondary | ICD-10-CM | POA: Diagnosis not present

## 2016-10-24 DIAGNOSIS — J3081 Allergic rhinitis due to animal (cat) (dog) hair and dander: Secondary | ICD-10-CM | POA: Diagnosis not present

## 2016-11-05 DIAGNOSIS — J3081 Allergic rhinitis due to animal (cat) (dog) hair and dander: Secondary | ICD-10-CM | POA: Diagnosis not present

## 2016-11-05 DIAGNOSIS — J3089 Other allergic rhinitis: Secondary | ICD-10-CM | POA: Diagnosis not present

## 2016-11-05 DIAGNOSIS — J301 Allergic rhinitis due to pollen: Secondary | ICD-10-CM | POA: Diagnosis not present

## 2016-11-12 DIAGNOSIS — J3089 Other allergic rhinitis: Secondary | ICD-10-CM | POA: Diagnosis not present

## 2016-11-12 DIAGNOSIS — J3081 Allergic rhinitis due to animal (cat) (dog) hair and dander: Secondary | ICD-10-CM | POA: Diagnosis not present

## 2016-11-12 DIAGNOSIS — J301 Allergic rhinitis due to pollen: Secondary | ICD-10-CM | POA: Diagnosis not present

## 2016-11-18 DIAGNOSIS — J3089 Other allergic rhinitis: Secondary | ICD-10-CM | POA: Diagnosis not present

## 2016-11-18 DIAGNOSIS — J301 Allergic rhinitis due to pollen: Secondary | ICD-10-CM | POA: Diagnosis not present

## 2016-11-18 DIAGNOSIS — J3081 Allergic rhinitis due to animal (cat) (dog) hair and dander: Secondary | ICD-10-CM | POA: Diagnosis not present

## 2016-11-28 DIAGNOSIS — J301 Allergic rhinitis due to pollen: Secondary | ICD-10-CM | POA: Diagnosis not present

## 2016-11-28 DIAGNOSIS — J3089 Other allergic rhinitis: Secondary | ICD-10-CM | POA: Diagnosis not present

## 2016-11-28 DIAGNOSIS — J3081 Allergic rhinitis due to animal (cat) (dog) hair and dander: Secondary | ICD-10-CM | POA: Diagnosis not present

## 2016-12-03 DIAGNOSIS — J3081 Allergic rhinitis due to animal (cat) (dog) hair and dander: Secondary | ICD-10-CM | POA: Diagnosis not present

## 2016-12-03 DIAGNOSIS — J3089 Other allergic rhinitis: Secondary | ICD-10-CM | POA: Diagnosis not present

## 2016-12-03 DIAGNOSIS — J301 Allergic rhinitis due to pollen: Secondary | ICD-10-CM | POA: Diagnosis not present

## 2016-12-09 DIAGNOSIS — J301 Allergic rhinitis due to pollen: Secondary | ICD-10-CM | POA: Diagnosis not present

## 2016-12-09 DIAGNOSIS — J3081 Allergic rhinitis due to animal (cat) (dog) hair and dander: Secondary | ICD-10-CM | POA: Diagnosis not present

## 2016-12-09 DIAGNOSIS — J3089 Other allergic rhinitis: Secondary | ICD-10-CM | POA: Diagnosis not present

## 2016-12-16 DIAGNOSIS — J3081 Allergic rhinitis due to animal (cat) (dog) hair and dander: Secondary | ICD-10-CM | POA: Diagnosis not present

## 2016-12-16 DIAGNOSIS — J3089 Other allergic rhinitis: Secondary | ICD-10-CM | POA: Diagnosis not present

## 2016-12-16 DIAGNOSIS — J301 Allergic rhinitis due to pollen: Secondary | ICD-10-CM | POA: Diagnosis not present

## 2016-12-24 DIAGNOSIS — J301 Allergic rhinitis due to pollen: Secondary | ICD-10-CM | POA: Diagnosis not present

## 2016-12-24 DIAGNOSIS — J3089 Other allergic rhinitis: Secondary | ICD-10-CM | POA: Diagnosis not present

## 2016-12-24 DIAGNOSIS — J3081 Allergic rhinitis due to animal (cat) (dog) hair and dander: Secondary | ICD-10-CM | POA: Diagnosis not present

## 2016-12-26 DIAGNOSIS — J3089 Other allergic rhinitis: Secondary | ICD-10-CM | POA: Diagnosis not present

## 2016-12-26 DIAGNOSIS — J301 Allergic rhinitis due to pollen: Secondary | ICD-10-CM | POA: Diagnosis not present

## 2016-12-26 DIAGNOSIS — J3081 Allergic rhinitis due to animal (cat) (dog) hair and dander: Secondary | ICD-10-CM | POA: Diagnosis not present

## 2016-12-31 DIAGNOSIS — J301 Allergic rhinitis due to pollen: Secondary | ICD-10-CM | POA: Diagnosis not present

## 2016-12-31 DIAGNOSIS — J3081 Allergic rhinitis due to animal (cat) (dog) hair and dander: Secondary | ICD-10-CM | POA: Diagnosis not present

## 2016-12-31 DIAGNOSIS — J3089 Other allergic rhinitis: Secondary | ICD-10-CM | POA: Diagnosis not present

## 2017-01-06 DIAGNOSIS — J3081 Allergic rhinitis due to animal (cat) (dog) hair and dander: Secondary | ICD-10-CM | POA: Diagnosis not present

## 2017-01-06 DIAGNOSIS — J301 Allergic rhinitis due to pollen: Secondary | ICD-10-CM | POA: Diagnosis not present

## 2017-01-06 DIAGNOSIS — J3089 Other allergic rhinitis: Secondary | ICD-10-CM | POA: Diagnosis not present

## 2017-01-09 DIAGNOSIS — J3089 Other allergic rhinitis: Secondary | ICD-10-CM | POA: Diagnosis not present

## 2017-01-09 DIAGNOSIS — J3081 Allergic rhinitis due to animal (cat) (dog) hair and dander: Secondary | ICD-10-CM | POA: Diagnosis not present

## 2017-01-09 DIAGNOSIS — J301 Allergic rhinitis due to pollen: Secondary | ICD-10-CM | POA: Diagnosis not present

## 2017-01-23 DIAGNOSIS — J3089 Other allergic rhinitis: Secondary | ICD-10-CM | POA: Diagnosis not present

## 2017-01-23 DIAGNOSIS — J3081 Allergic rhinitis due to animal (cat) (dog) hair and dander: Secondary | ICD-10-CM | POA: Diagnosis not present

## 2017-01-23 DIAGNOSIS — J301 Allergic rhinitis due to pollen: Secondary | ICD-10-CM | POA: Diagnosis not present

## 2017-01-27 DIAGNOSIS — J3081 Allergic rhinitis due to animal (cat) (dog) hair and dander: Secondary | ICD-10-CM | POA: Diagnosis not present

## 2017-01-27 DIAGNOSIS — J301 Allergic rhinitis due to pollen: Secondary | ICD-10-CM | POA: Diagnosis not present

## 2017-01-27 DIAGNOSIS — J3089 Other allergic rhinitis: Secondary | ICD-10-CM | POA: Diagnosis not present

## 2017-02-03 DIAGNOSIS — J301 Allergic rhinitis due to pollen: Secondary | ICD-10-CM | POA: Diagnosis not present

## 2017-02-03 DIAGNOSIS — J3081 Allergic rhinitis due to animal (cat) (dog) hair and dander: Secondary | ICD-10-CM | POA: Diagnosis not present

## 2017-02-03 DIAGNOSIS — J3089 Other allergic rhinitis: Secondary | ICD-10-CM | POA: Diagnosis not present

## 2017-02-19 DIAGNOSIS — J3089 Other allergic rhinitis: Secondary | ICD-10-CM | POA: Diagnosis not present

## 2017-02-19 DIAGNOSIS — J3081 Allergic rhinitis due to animal (cat) (dog) hair and dander: Secondary | ICD-10-CM | POA: Diagnosis not present

## 2017-02-19 DIAGNOSIS — J301 Allergic rhinitis due to pollen: Secondary | ICD-10-CM | POA: Diagnosis not present

## 2017-02-19 NOTE — Progress Notes (Signed)
Complete Physical  Assessment and Plan: Essential hypertension - monitor at home, if still elevated we will send in alpha blocker, DASH diet, exercise and monitor at home. Call if greater than 130/80.  - CBC with Differential/Platelet - BASIC METABOLIC PANEL WITH GFR - Hepatic function panel - TSH - Urinalysis, Routine w reflex microscopic (not at [facility]) - Microalbumin / creatinine urine ratio - EKG 12-Lead  Hyperlipidemia -continue medications, check lipids, decrease fatty foods, increase activity.  - Lipid panel  Hypogonadism male Hypogonadism- continue replacement therapy, check testosterone levels as needed.  - Testosterone  Asthma, mild intermittent, uncomplicated Resent in inhaler  Depression, remission Resent in xanax, takes rarely  Gastroesophageal reflux disease with esophagitis GERD- will try to get off PPiI given info for taper and zantac sent in  Erectile dysfunction, unspecified erectile dysfunction type meds PRN  Vitamin D deficiency - Vit D  25 hydroxy (rtn osteoporosis monitoring)  Medication management - Magnesium   History of basal cell carcinoma Follows Derm   Psoriasis Follows rheum.   Encounter for general adult medical examination with abnormal findings  Need TDAP  Insomnia Try silensor samples, ? Need sleep study, fatigue in AM, fatigue, wakes frequently  Discussed med's effects and SE's. Screening labs and tests as requested with regular follow-up as recommended.  Future Appointments Date Time Provider [place]  02/26/2018 9:30 AM [name], PA-C [facility] None    HPI  His blood pressure has been controlled at home, today their BP is  , rechecked 140/80.  He does workout. He denies chest pain, shortness of breath, dizziness.  He has history of joint pain, has psoriasis and possible psoriatic arthritis, followed with Dr. Patrecia Pour, at this time he is only doing topical treatment and mobic PRN, no DMARD. He is on  cholesterol medication, lipitor 1/2 pill 2 x a week and denies myalgias. His cholesterol is at goal. The cholesterol last visit was:   Lab Results  Component Value Date   CHOL 226 (H) 08/15/2016   HDL 38 (L) 08/15/2016   LDLCALC 162 (H) 08/15/2016   TRIG 132 08/15/2016   CHOLHDL 5.9 (H) 08/15/2016    Last A1C in the office was:  Lab Results  Component Value Date   HGBA1C 5.4 02/09/2015   He is has GERD, takes protonix when he travels.  He has a history of testosterone deficiency and is on testosterone replacement. He states that the testosterone helps with his energy, libido, muscle mass. Lab Results  Component Value Date   TESTOSTERONE 453 02/14/2016   Lab Results  Component Value Date   PSA 1.1 02/14/2016   PSA 1.58 02/09/2015   PSA 1.22 02/03/2014   Patient is on Vitamin D supplement.   Lab Results  Component Value Date   VD25OH 45 02/14/2016      Current Medications:  Current Outpatient Prescriptions on File Prior to Visit  Medication Sig Dispense Refill  . albuterol (VENTOLIN HFA) 108 (90 BASE) MCG/ACT inhaler Inhale 2 puffs into the lungs every 4 (four) hours as needed for wheezing or shortness of breath. 1 Inhaler 3  . ANDROGEL PUMP 20.25 MG/ACT (1.62%) GEL APPLY 2 PUMPS TO EACH ARM DAILY. 150 g 0  . aspirin 81 MG chewable tablet Chew by mouth daily.    . diclofenac (FLECTOR) 1.3 % PTCH Place 1 patch onto the skin 2 (two) times daily. 30 patch 2  . diclofenac sodium (VOLTAREN) 1 % GEL Apply topically daily.    Marland Kitchen loratadine (CLARITIN) 10 MG  tablet Take 10 mg by mouth daily.    . meloxicam (MOBIC) 15 MG tablet TAKE 1 TABLET ONCE DAILY AS NEEDED FOR PAIN WITH FOOD. 31 tablet 0  . Omega-3 Fatty Acids (FISH OIL PO) Take by mouth daily.    Marland Kitchen omeprazole (PRILOSEC) 20 MG capsule Take 20 mg by mouth daily as needed.    . ranitidine (ZANTAC) 300 MG tablet Take 1 tablet (300 mg total) by mouth at bedtime. 30 tablet 11  . triamcinolone cream (KENALOG) 0.1 % Apply 1 application  topically 2 (two) times daily. 45 g 1   No current facility-administered medications on file prior to visit.    Health Maintenance:  Immunization History  Administered Date(s) Administered  . Influenza Split 03/28/2013, 03/30/2014  . Influenza, Seasonal, Injecte, Preservative Fre 05/04/2015   Tetanus: 2008 DUE Pneumovax: Prevnar 13:  Flu vaccine: 2017 at allergist Zostavax: DEXA: Colonoscopy: 02/23/2015 EGD:  Medical History:  Patient Active Problem List   Diagnosis Date Noted  . History of basal cell carcinoma 02/09/2015  . Psoriasis 02/09/2015  . Hypogonadism male 08/18/2014  . 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 Hypertension   . Hyperlipidemia   . Allergy   . Asthma   . Depression   . GERD (gastroesophageal reflux disease)   . ED (erectile dysfunction)   . Plantar fasciitis 01/06/2012   Allergies No Known Allergies  SURGICAL HISTORY He  has a past surgical history that includes Vasectomy (2013); orthopedic surgery; Hernia repair; and LASIK. FAMILY HISTORY His family history includes Asthma in his father; Heart disease in his father and mother; Kidney disease in his father; Spondylitis in his brother; Uterine cancer in his mother. SOCIAL HISTORY He  reports that he has quit smoking. His smoking use included Cigars. He has never used smokeless tobacco. He reports that he drinks alcohol. He reports that he does not use drugs.  Review of Systems  Constitutional: Negative.   HENT: Negative.   Eyes: Negative.   Respiratory: Negative.   Cardiovascular: Negative.   Gastrointestinal: Negative for abdominal pain, blood in stool, constipation, diarrhea, heartburn, melena, nausea and vomiting.  Genitourinary: Negative.        Dribbling  Musculoskeletal: Negative for back pain, falls, joint pain, myalgias and neck pain.  Skin: Negative.   Neurological: Negative for dizziness, tingling (bilateral hands at night), tremors, sensory change, speech change, focal weakness, seizures  and loss of consciousness.  Psychiatric/Behavioral: Negative.     Physical Exam: Estimated body mass index is 25.23 kg/m as calculated from the following:   Height as of this encounter: 6' (1.829 m).   Weight as of this encounter: 186 lb (84.4 kg). Pulse 73   Temp (!) 97.3 F (36.3 C)   Ht 6' (1.829 m)   Wt 186 lb (84.4 kg)   SpO2 98%   BMI 25.23 kg/m  General Appearance: Well nourished, in no apparent distress. Eyes: PERRLA, EOMs, conjunctiva no swelling or erythema, normal fundi and vessels. Sinuses: No Frontal/maxillary tenderness ENT/Mouth: Ext aud canals clear, normal light reflex with TMs without erythema, bulging. Good dentition. No erythema, swelling, or exudate on post pharynx. Tonsils not swollen or erythematous. Hearing normal.  Neck: Supple, thyroid normal. No bruits Respiratory: Respiratory effort normal, BS equal bilaterally without rales, rhonchi, wheezing or stridor. Cardio: RRR without murmurs, rubs or gallops. Brisk peripheral pulses without edema.  Chest: symmetric, with normal excursions and percussion. Abdomen: Soft, +BS. Non tender, no guarding, rebound, hernias, masses, or organomegaly.  Lymphatics: Non tender without lymphadenopathy.  Genitourinary: defer Musculoskeletal: Full ROM all peripheral extremities,5/5 strength, and normal gait. Skin: Warm, dry without rashes, lesions, ecchymosis.  Neuro: Cranial nerves intact, reflexes equal bilaterally. Normal muscle tone, no cerebellar symptoms. Sensation intact.  Psych: Awake and oriented X 3, normal affect, Insight and Judgment appropriate.   EKG: WNL, PRWP  no changes. AORTA SCAN: defer  [name] 10:08 AM

## 2017-02-20 ENCOUNTER — Encounter: Payer: Self-pay | Admitting: Physician Assistant

## 2017-02-20 ENCOUNTER — Ambulatory Visit (INDEPENDENT_AMBULATORY_CARE_PROVIDER_SITE_OTHER): Payer: 59 | Admitting: Physician Assistant

## 2017-02-20 VITALS — BP 126/96 | HR 73 | Temp 97.3°F | Ht 72.0 in | Wt 186.0 lb

## 2017-02-20 DIAGNOSIS — Z136 Encounter for screening for cardiovascular disorders: Secondary | ICD-10-CM

## 2017-02-20 DIAGNOSIS — K2100 Gastro-esophageal reflux disease with esophagitis, without bleeding: Secondary | ICD-10-CM

## 2017-02-20 DIAGNOSIS — E559 Vitamin D deficiency, unspecified: Secondary | ICD-10-CM

## 2017-02-20 DIAGNOSIS — Z23 Encounter for immunization: Secondary | ICD-10-CM | POA: Diagnosis not present

## 2017-02-20 DIAGNOSIS — Z0000 Encounter for general adult medical examination without abnormal findings: Secondary | ICD-10-CM | POA: Diagnosis not present

## 2017-02-20 DIAGNOSIS — Z79899 Other long term (current) drug therapy: Secondary | ICD-10-CM

## 2017-02-20 DIAGNOSIS — I10 Essential (primary) hypertension: Secondary | ICD-10-CM

## 2017-02-20 DIAGNOSIS — G4700 Insomnia, unspecified: Secondary | ICD-10-CM

## 2017-02-20 DIAGNOSIS — F3341 Major depressive disorder, recurrent, in partial remission: Secondary | ICD-10-CM

## 2017-02-20 DIAGNOSIS — E785 Hyperlipidemia, unspecified: Secondary | ICD-10-CM

## 2017-02-20 DIAGNOSIS — J45909 Unspecified asthma, uncomplicated: Secondary | ICD-10-CM

## 2017-02-20 DIAGNOSIS — M19019 Primary osteoarthritis, unspecified shoulder: Secondary | ICD-10-CM

## 2017-02-20 DIAGNOSIS — E291 Testicular hypofunction: Secondary | ICD-10-CM

## 2017-02-20 DIAGNOSIS — Z85828 Personal history of other malignant neoplasm of skin: Secondary | ICD-10-CM

## 2017-02-20 DIAGNOSIS — L409 Psoriasis, unspecified: Secondary | ICD-10-CM

## 2017-02-20 DIAGNOSIS — N529 Male erectile dysfunction, unspecified: Secondary | ICD-10-CM

## 2017-02-20 DIAGNOSIS — T7840XS Allergy, unspecified, sequela: Secondary | ICD-10-CM

## 2017-02-20 MED ORDER — ALPRAZOLAM 0.5 MG PO TABS: 0.5000 mg | ORAL_TABLET | Freq: Two times a day (BID) | ORAL | 0 refills | Status: AC | PRN

## 2017-02-20 MED ORDER — PREDNISONE 20 MG PO TABS: ORAL_TABLET | ORAL | 0 refills | Status: AC

## 2017-02-20 NOTE — Patient Instructions (Addendum)
Monitor your blood pressure at home. Go to the ER if any CP, SOB, nausea, dizziness, severe HA, changes vision/speech  Goal BP:  For patients younger than 60: Goal BP < 140/90. For patients 60 and older: Goal BP < 150/90. For patients with diabetes: Goal BP < 140/90. Your most recent BP: BP: (!) 126/96   Take your medications faithfully as instructed. Maintain a healthy weight. Get at least 150 minutes of aerobic exercise per week. Minimize salt intake. Minimize alcohol intake  DASH Eating Plan DASH stands for "Dietary Approaches to Stop Hypertension." The DASH eating plan is a healthy eating plan that has been shown to reduce high blood pressure (hypertension). Additional health benefits may include reducing the risk of type 2 diabetes mellitus, heart disease, and stroke. The DASH eating plan may also help with weight loss. WHAT DO I NEED TO KNOW ABOUT THE DASH EATING PLAN? For the DASH eating plan, you will follow these general guidelines:  Choose foods with a percent daily value for sodium of less than 5% (as listed on the food label).  Use salt-free seasonings or herbs instead of table salt or sea salt.  Check with your health care provider or pharmacist before using salt substitutes.  Eat lower-sodium products, often labeled as "lower sodium" or "no salt added."  Eat fresh foods.  Eat more vegetables, fruits, and low-fat dairy products.  Choose whole grains. Look for the word "whole" as the first word in the ingredient list.  Choose fish and skinless chicken or [place] more often than red meat. Limit fish, poultry, and meat to 6 oz (170 g) each day.  Limit sweets, desserts, sugars, and sugary drinks.  Choose heart-healthy fats.  Limit cheese to 1 oz (28 g) per day.  Eat more home-cooked food and less restaurant, buffet, and fast food.  Limit fried foods.  Cook foods using methods other than frying.  Limit canned vegetables. If you do use them, rinse them well to  decrease the sodium.  When eating at a restaurant, ask that your food be prepared with less salt, or no salt if possible. WHAT FOODS CAN I EAT? Seek help from a dietitian for individual calorie needs. Grains Whole grain or whole wheat bread. Brown rice. Whole grain or whole wheat pasta. Quinoa, bulgur, and whole grain cereals. Low-sodium cereals. Corn or whole wheat flour tortillas. Whole grain cornbread. Whole grain crackers. Low-sodium crackers. Vegetables Fresh or frozen vegetables (raw, steamed, roasted, or grilled). Low-sodium or reduced-sodium tomato and vegetable juices. Low-sodium or reduced-sodium tomato sauce and paste. Low-sodium or reduced-sodium canned vegetables.  Fruits All fresh, canned (in natural juice), or frozen fruits. Meat and Other Protein Products Ground beef (85% or leaner), grass-fed beef, or beef trimmed of fat. Skinless chicken or [place]. Ground chicken or [place]. Pork trimmed of fat. All fish and seafood. Eggs. Dried beans, peas, or lentils. Unsalted nuts and seeds. Unsalted canned beans. Dairy Low-fat dairy products, such as skim or 1% milk, 2% or reduced-fat cheeses, low-fat ricotta or cottage cheese, or plain low-fat yogurt. Low-sodium or reduced-sodium cheeses. Fats and Oils Tub margarines without trans fats. Light or reduced-fat mayonnaise and salad dressings (reduced sodium). Avocado. Safflower, olive, or canola oils. Natural peanut or almond butter. Other Unsalted popcorn and pretzels. The items listed above may not be a complete list of recommended foods or beverages. Contact your dietitian for more options. WHAT FOODS ARE NOT RECOMMENDED? Grains White bread. White pasta. White rice. Refined cornbread. Bagels and croissants. Crackers that contain  trans fat. Vegetables Creamed or fried vegetables. Vegetables in a cheese sauce. Regular canned vegetables. Regular canned tomato sauce and paste. Regular tomato and vegetable juices. Fruits Dried fruits. Canned  fruit in light or heavy syrup. Fruit juice. Meat and Other Protein Products Fatty cuts of meat. Ribs, chicken wings, bacon, sausage, bologna, salami, chitterlings, fatback, hot dogs, bratwurst, and packaged luncheon meats. Salted nuts and seeds. Canned beans with salt. Dairy Whole or 2% milk, cream, half-and-half, and cream cheese. Whole-fat or sweetened yogurt. Full-fat cheeses or blue cheese. Nondairy creamers and whipped toppings. Processed cheese, cheese spreads, or cheese curds. Condiments Onion and garlic salt, seasoned salt, table salt, and sea salt. Canned and packaged gravies. Worcestershire sauce. Tartar sauce. Barbecue sauce. Teriyaki sauce. Soy sauce, including reduced sodium. Steak sauce. Fish sauce. Oyster sauce. Cocktail sauce. Horseradish. Ketchup and mustard. Meat flavorings and tenderizers. Bouillon cubes. Hot sauce. Tabasco sauce. Marinades. Taco seasonings. Relishes. Fats and Oils Butter, stick margarine, lard, shortening, ghee, and bacon fat. Coconut, palm kernel, or palm oils. Regular salad dressings. Other Pickles and olives. Salted popcorn and pretzels. The items listed above may not be a complete list of foods and beverages to avoid. Contact your dietitian for more information. WHERE CAN I FIND MORE INFORMATION? National Heart, Lung, and Blood Institute: travelstabloid.com Document Released: 05/22/2011 Document Revised: 10/17/2013 Document Reviewed: 04/06/2013 [facility] Patient Information 2015 [place], [place]. This information is not intended to replace advice given to you by your health care provider. Make sure you discuss any questions you have with your health care provider.    Cholesterol Cholesterol is a white, waxy, fat-like substance that is needed by the human body in small amounts. The liver makes all the cholesterol we need. Cholesterol is carried from the liver by the blood through the blood vessels. Deposits of cholesterol  (plaques) may build up on blood vessel (artery) walls. Plaques make the arteries narrower and stiffer. Cholesterol plaques increase the risk for heart attack and stroke. You cannot feel your cholesterol level even if it is very high. The only way to know that it is high is to have a blood test. Once you know your cholesterol levels, you should keep a record of the test results. Work with your health care provider to keep your levels in the desired range. What do the results mean?  Total cholesterol is a rough measure of all the cholesterol in your blood.  LDL (low-density lipoprotein) is the "bad" cholesterol. This is the type that causes plaque to build up on the artery walls. You want this level to be low.  HDL (high-density lipoprotein) is the "good" cholesterol because it cleans the arteries and carries the LDL away. You want this level to be high.  Triglycerides are fat that the body can either burn for energy or store. High levels are closely linked to heart disease. What are the desired levels of cholesterol?  Total cholesterol below 200.  LDL below 100 for people who are at risk, below 70 for people at very high risk.  HDL above 40 is good. A level of 60 or higher is considered to be protective against heart disease.  Triglycerides below 150. How can I lower my cholesterol? Diet Follow your diet program as told by your health care provider.  Choose fish or white meat chicken and [place], roasted or baked. Limit fatty cuts of red meat, fried foods, and processed meats, such as sausage and lunch meats.  Eat lots of fresh fruits and vegetables.  Choose  whole grains, beans, pasta, potatoes, and cereals.  Choose olive oil, corn oil, or canola oil, and use only small amounts.  Avoid butter, mayonnaise, shortening, or palm kernel oils.  Avoid foods with trans fats.  Drink skim or nonfat milk and eat low-fat or nonfat yogurt and cheeses. Avoid whole milk, cream, ice cream, egg  yolks, and full-fat cheeses.  Healthier desserts include angel food cake, ginger snaps, animal crackers, hard candy, popsicles, and low-fat or nonfat frozen yogurt. Avoid pastries, cakes, pies, and cookies.  Exercise  Follow your exercise program as told by your health care provider. A regular program: ? Helps to decrease LDL and raise HDL. ? Helps with weight control.  Do things that increase your activity level, such as gardening, walking, and taking the stairs.  Ask your health care provider about ways that you can be more active in your daily life.  Medicine  Take over-the-counter and prescription medicines only as told by your health care provider. ? Medicine may be prescribed by your health care provider to help lower cholesterol and decrease the risk for heart disease. This is usually done if diet and exercise have failed to bring down cholesterol levels. ? If you have several risk factors, you may need medicine even if your levels are normal.  This information is not intended to replace advice given to you by your health care provider. Make sure you discuss any questions you have with your health care provider. Document Released: 02/25/2001 Document Revised: 12/29/2015 Document Reviewed: 12/01/2015 Elsevier Interactive Patient Education  2017 [employer].

## 2017-02-21 LAB — BASIC METABOLIC PANEL WITH GFR
BUN: 13 mg/dL (ref 7–25)
CALCIUM: 9.9 mg/dL (ref 8.6–10.3)
CHLORIDE: 101 mmol/L (ref 98–110)
CO2: 31 mmol/L (ref 20–32)
Creat: 0.89 mg/dL (ref 0.70–1.33)
GFR, Est African American: 113 mL/min/{1.73_m2} (ref >=60)
GFR, Est Non African American: 98 mL/min/{1.73_m2} (ref >=60)
Glucose, Bld: 103 mg/dL — ABNORMAL HIGH (ref 65–99)
POTASSIUM: 4.4 mmol/L (ref 3.5–5.3)
Sodium: 137 mmol/L (ref 135–146)

## 2017-02-21 LAB — CBC WITH DIFFERENTIAL/PLATELET
BASOS PCT: 1.1 %
Basophils Absolute: 63 cells/uL (ref 0–200)
Eosinophils Absolute: 268 cells/uL (ref 15–500)
Eosinophils Relative: 4.7 %
HCT: 48.3 % (ref 38.5–50.0)
Hemoglobin: 16.4 g/dL (ref 13.2–17.1)
Lymphs Abs: 1385 cells/uL (ref 850–3900)
MCH: 31.5 pg (ref 27.0–33.0)
MCHC: 34 g/dL (ref 32.0–36.0)
MCV: 92.9 fL (ref 80.0–100.0)
MONOS PCT: 8.9 %
MPV: 11.4 fL (ref 7.5–12.5)
NEUTROS PCT: 61 %
Neutro Abs: 3477 cells/uL (ref 1500–7800)
PLATELETS: 215 10*3/uL (ref 140–400)
RBC: 5.2 10*6/uL (ref 4.20–5.80)
RDW: 11.9 % (ref 11.0–15.0)
TOTAL LYMPHOCYTE: 24.3 %
WBC mixed population: 507 cells/uL (ref 200–950)
WBC: 5.7 10*3/uL (ref 3.8–10.8)

## 2017-02-21 LAB — TSH: TSH: 1.42 mIU/L (ref 0.40–4.50)

## 2017-02-21 LAB — MICROALBUMIN / CREATININE URINE RATIO
Creatinine, Urine: 278 mg/dL (ref 20–370)
MICROALB/CREAT RATIO: 2 ug/mg{creat} (ref <30)
Microalb, Ur: 0.6 mg/dL

## 2017-02-21 LAB — HEPATIC FUNCTION PANEL
AG RATIO: 1.5 (calc) (ref 1.0–2.5)
ALKALINE PHOSPHATASE (APISO): 45 U/L (ref 40–115)
ALT: 70 U/L — AB (ref 9–46)
AST: 63 U/L — AB (ref 10–35)
Albumin: 4.4 g/dL (ref 3.6–5.1)
BILIRUBIN TOTAL: 1.5 mg/dL — AB (ref 0.2–1.2)
Bilirubin, Direct: 0.3 mg/dL — ABNORMAL HIGH (ref 0.0–0.2)
Globulin: 2.9 g/dL (calc) (ref 1.9–3.7)
Indirect Bilirubin: 1.2 mg/dL (calc) (ref 0.2–1.2)
TOTAL PROTEIN: 7.3 g/dL (ref 6.1–8.1)

## 2017-02-21 LAB — URINALYSIS W MICROSCOPIC + REFLEX CULTURE
BILIRUBIN URINE: NEGATIVE
Bacteria, UA: NONE SEEN /HPF
GLUCOSE, UA: NEGATIVE
HYALINE CAST: NONE SEEN /LPF
Hgb urine dipstick: NEGATIVE
Ketones, ur: NEGATIVE
Leukocyte Esterase: NEGATIVE
NITRITES URINE, INITIAL: NEGATIVE
PH: 7 (ref 5.0–8.0)
Protein, ur: NEGATIVE
RBC / HPF: NONE SEEN /HPF (ref 0–2)
SPECIFIC GRAVITY, URINE: 1.023 (ref 1.001–1.03)
Squamous Epithelial / LPF: NONE SEEN /HPF (ref <=5)
WBC, UA: NONE SEEN /HPF (ref 0–5)

## 2017-02-21 LAB — TESTOSTERONE: TESTOSTERONE: 668 ng/dL (ref 250–827)

## 2017-02-21 LAB — LIPID PANEL
CHOL/HDL RATIO: 3.5 (calc) (ref <5.0)
Cholesterol: 164 mg/dL (ref <200)
HDL: 47 mg/dL (ref >40)
LDL Cholesterol (Calc): 91 mg/dL (calc)
NON-HDL CHOLESTEROL (CALC): 117 mg/dL (ref <130)
Triglycerides: 156 mg/dL — ABNORMAL HIGH (ref <150)

## 2017-02-21 LAB — MAGNESIUM: MAGNESIUM: 1.7 mg/dL (ref 1.5–2.5)

## 2017-02-21 LAB — VITAMIN D 25 HYDROXY (VIT D DEFICIENCY, FRACTURES): VIT D 25 HYDROXY: 47 ng/mL (ref 30–100)

## 2017-02-21 LAB — NO CULTURE INDICATED

## 2017-02-22 ENCOUNTER — Other Ambulatory Visit: Payer: Self-pay | Admitting: Physician Assistant

## 2017-02-22 DIAGNOSIS — R748 Abnormal levels of other serum enzymes: Secondary | ICD-10-CM

## 2017-02-23 ENCOUNTER — Other Ambulatory Visit: Payer: Self-pay | Admitting: Rheumatology

## 2017-02-23 ENCOUNTER — Encounter: Payer: Self-pay | Admitting: Physician Assistant

## 2017-02-23 ENCOUNTER — Other Ambulatory Visit: Payer: Self-pay | Admitting: Physician Assistant

## 2017-02-23 ENCOUNTER — Other Ambulatory Visit: Payer: Self-pay | Admitting: *Deleted

## 2017-02-23 MED ORDER — TRIAMCINOLONE ACETONIDE 0.1 % EX CREA: 1.0000 "application " | TOPICAL_CREAM | Freq: Two times a day (BID) | CUTANEOUS | 1 refills | Status: AC

## 2017-02-23 NOTE — Telephone Encounter (Signed)
Please call [employer]

## 2017-02-24 DIAGNOSIS — J3089 Other allergic rhinitis: Secondary | ICD-10-CM | POA: Diagnosis not present

## 2017-02-24 DIAGNOSIS — J3081 Allergic rhinitis due to animal (cat) (dog) hair and dander: Secondary | ICD-10-CM | POA: Diagnosis not present

## 2017-02-24 DIAGNOSIS — J301 Allergic rhinitis due to pollen: Secondary | ICD-10-CM | POA: Diagnosis not present

## 2017-02-25 ENCOUNTER — Other Ambulatory Visit: Payer: Self-pay

## 2017-02-27 ENCOUNTER — Ambulatory Visit (HOSPITAL_COMMUNITY): Payer: 59

## 2017-03-03 ENCOUNTER — Other Ambulatory Visit: Payer: 59

## 2017-03-13 DIAGNOSIS — J3089 Other allergic rhinitis: Secondary | ICD-10-CM | POA: Diagnosis not present

## 2017-03-13 DIAGNOSIS — J3081 Allergic rhinitis due to animal (cat) (dog) hair and dander: Secondary | ICD-10-CM | POA: Diagnosis not present

## 2017-03-13 DIAGNOSIS — J301 Allergic rhinitis due to pollen: Secondary | ICD-10-CM | POA: Diagnosis not present

## 2017-03-13 DIAGNOSIS — J45990 Exercise induced bronchospasm: Secondary | ICD-10-CM | POA: Diagnosis not present

## 2017-03-27 DIAGNOSIS — J301 Allergic rhinitis due to pollen: Secondary | ICD-10-CM | POA: Diagnosis not present

## 2017-03-27 DIAGNOSIS — J3081 Allergic rhinitis due to animal (cat) (dog) hair and dander: Secondary | ICD-10-CM | POA: Diagnosis not present

## 2017-03-27 DIAGNOSIS — J3089 Other allergic rhinitis: Secondary | ICD-10-CM | POA: Diagnosis not present

## 2017-04-23 DIAGNOSIS — J3081 Allergic rhinitis due to animal (cat) (dog) hair and dander: Secondary | ICD-10-CM | POA: Diagnosis not present

## 2017-04-23 DIAGNOSIS — J3089 Other allergic rhinitis: Secondary | ICD-10-CM | POA: Diagnosis not present

## 2017-04-23 DIAGNOSIS — J301 Allergic rhinitis due to pollen: Secondary | ICD-10-CM | POA: Diagnosis not present

## 2017-04-24 DIAGNOSIS — Z23 Encounter for immunization: Secondary | ICD-10-CM | POA: Diagnosis not present

## 2017-05-15 DIAGNOSIS — L409 Psoriasis, unspecified: Secondary | ICD-10-CM | POA: Diagnosis not present

## 2017-05-15 DIAGNOSIS — J3081 Allergic rhinitis due to animal (cat) (dog) hair and dander: Secondary | ICD-10-CM | POA: Diagnosis not present

## 2017-05-15 DIAGNOSIS — J301 Allergic rhinitis due to pollen: Secondary | ICD-10-CM | POA: Diagnosis not present

## 2017-05-15 DIAGNOSIS — J3089 Other allergic rhinitis: Secondary | ICD-10-CM | POA: Diagnosis not present

## 2017-05-15 DIAGNOSIS — L738 Other specified follicular disorders: Secondary | ICD-10-CM | POA: Diagnosis not present

## 2017-05-15 DIAGNOSIS — C4441 Basal cell carcinoma of skin of scalp and neck: Secondary | ICD-10-CM | POA: Diagnosis not present

## 2017-05-15 DIAGNOSIS — D485 Neoplasm of uncertain behavior of skin: Secondary | ICD-10-CM | POA: Diagnosis not present

## 2017-05-18 ENCOUNTER — Other Ambulatory Visit: Payer: Self-pay | Admitting: Internal Medicine

## 2017-05-18 NOTE — Progress Notes (Signed)
Office Visit Note  Patient: Stephen Campbell             Date of Birth: [birth year]           MRN: [id number]             PCP: Unk Pinto, MD Referring: Unk Pinto, MD Visit Date: 05/29/2017 Occupation: _0 @    Subjective:  Pain in multiple joints    History of Present Illness: Stephen Campbell is a 56 y.o. male with history of psoriasis, plantar fasciitis, and HLA B27 positive.  Patient states he has been experiencing joint pain in several joints including his right knee, right shoulder, and feet pain.  He denies any joint swelling.  He also experiences SI joint tenderness but denies achilles tendonitis.  He states he was previously seen for plantar fasciitis and received injections, which helped temporarily.  He continues to have pain of his right medial arch and left heel.  He states his feet pain is worse with activity.  He states he noticed a nodule on his right 3rd PIP joint, which is tender and stiff.  He states the nodule went away and came back.  He denies a history of gout.  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  He also is having tingling sensation of his bilateral 4th and 5th digit. He states he also experiences intermittent bilateral thumb pain.  He states he recently followed up with his dermatologist who prescribed him a new topical cream for his psoriasis, which has helped significantly.  He has resolving patches on bilateral lower extremities with no scaling.  He also reports a recent skin biopsy that revealed basal cell carcinoma, which will be removed soon. He uses Mobic and Voltaren gel for pain relief.  He would like a refill of Voltaren gel.     Activities of Daily Living:  Patient reports morning stiffness for 30 minutes.   Patient Reports nocturnal pain.  Difficulty dressing/grooming: Denies Difficulty climbing stairs: Denies Difficulty getting out of chair: Denies Difficulty using hands for taps,  buttons, cutlery, and/or writing: Reports   Review of Systems  Constitutional: Positive for fatigue. Negative for night sweats and weakness.  HENT: Negative.  Negative for mouth sores, mouth dryness and nose dryness.   Eyes: Positive for redness and dryness.  Respiratory: Positive for cough (Asthma-cold induced). Negative for hemoptysis, shortness of breath and difficulty breathing.   Cardiovascular: Negative.  Positive for hypertension. Negative for chest pain, palpitations, irregular heartbeat and swelling in legs/feet.  Gastrointestinal: Negative.  Negative for blood in stool, constipation and diarrhea.  Endocrine: Negative.  Negative for increased urination.  Genitourinary: Negative for painful urination.  Musculoskeletal: Positive for arthralgias, joint pain and morning stiffness. Negative for joint swelling, myalgias, muscle weakness, muscle tenderness and myalgias.  Skin: Positive for rash (Psoriasis and eczema). Negative for color change, pallor, hair loss, nodules/bumps, redness, skin tightness, ulcers and sensitivity to sunlight.  Allergic/Immunologic: Negative for susceptible to infections.  Neurological: Negative for dizziness, fainting, memory loss and night sweats.  Hematological: Negative for swollen glands.  Psychiatric/Behavioral: Positive for depressed mood (managed with counseling) and sleep disturbance. The patient is not nervous/anxious.     PMFS History:  Patient Active Problem List   Diagnosis Date Noted  . History of basal cell carcinoma 02/09/2015  . Psoriasis 02/09/2015  . Hypogonadism male 08/18/2014  . 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 Hypertension   . Hyperlipidemia   .  Allergy   . Asthma   . Depression   . GERD (gastroesophageal reflux disease)   . ED (erectile dysfunction)   . Plantar fasciitis 01/06/2012    Past Medical History:  Diagnosis Date  . Allergy   . Anxiety   . Asthma   . Basal cell carcinoma   . Depression   . ED (erectile dysfunction)    . GERD (gastroesophageal reflux disease)   . Hyperlipidemia   . Hypertension   . Hypogonadism in male   . Osteoarthritis   . Vitamin D deficiency     Family History  Problem Relation Age of Onset  . Heart disease Mother   . Uterine cancer Mother   . Asthma Father   . Heart disease Father   . Kidney disease Father   . Rheum arthritis Father   . Spondylitis Brother   . Colon cancer Neg Hx   . Colon polyps Neg Hx   . Gallbladder disease Neg Hx   . Esophageal cancer Neg Hx   . Diabetes Neg Hx    Past Surgical History:  Procedure Laterality Date  . HERNIA REPAIR     56 yrs old  . LASIK    . ORTHOPEDIC SURGERY     left shoulder  . scalp biopsy     basal cell   . VASECTOMY  2013   Social History   Social History Narrative  . Not on file     Objective: Vital Signs: BP (!) 139/99 (BP Location: Left Arm, Patient Position: Sitting, Cuff Size: Normal)   Pulse 88   Resp 15   Ht 5' 11.5" (1.816 m)   Wt 191 lb (86.6 kg)   BMI 26.27 kg/m    Physical Exam  Constitutional: He is oriented to person, place, and time. He appears well-developed and well-nourished.  HENT:  Head: Normocephalic and atraumatic.  Eyes: Conjunctivae and EOM are normal. Pupils are equal, round, and reactive to light.  Neck: Normal range of motion. Neck supple.  Cardiovascular: Normal rate, regular rhythm and normal heart sounds.  Pulmonary/Chest: Effort normal and breath sounds normal.  Abdominal: Soft. Bowel sounds are normal.  Neurological: He is alert and oriented to person, place, and time.  Skin: Skin is warm and dry. Capillary refill takes less than 2 seconds.  Resolving psoriasis patches on bilateral lower extremities.  No scaling present.  Right 3rd PIP subcutaneous nodule, freely moveable   Psychiatric: He has a normal mood and affect. His behavior is normal.  Nursing note and vitals reviewed.    Musculoskeletal Exam: C-spine, thoracic spine good ROM.  Lumbar spine limited ROM.  Shoulder joints, elbow joints, and wrist joints good ROM. Discomfort with ROM of right shoulder.  MCPs, PIPs, and DIPs good ROM with no synovitis.  Full fist formation.  Good grip strength.  right   CDAI Exam: No CDAI exam completed.    Investigation: No additional findings.   Imaging: Xr Foot 2 Views Left  Result Date: 05/29/2017 No MTP, PIP, DIP joint space narrowing was noted. No intertarsal joint narrowing was noted. A small calcaneal spur was noted. Impression: Normal x-ray of the foot.  Xr Foot 2 Views Right  Result Date: 05/29/2017 No MTP, PIP, DIP joint space narrowing was noted. No intertarsal joint narrowing was noted. A small calcaneal spur was noted. Impression: Normal x-ray of the foot.  Xr Knee 3 View Right  Result Date: 05/29/2017 No joint space narrowing was noted. No chondrocalcinosis was noted. Mild patellofemoral narrowing  was noted. 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  Xr Shoulder Right  Result Date: 05/29/2017 No glenohumeral acromioclavicular joint space narrowing was noted. No chondrocalcinosis was noted. Impression: Unremarkable x-ray of the shoulder joint.   Speciality Comments: No specialty comments available.    Procedures:  Incision & Drainage Date/Time: 05/29/2017 11:46 AM Performed by: Bo Merino, MD Authorized by: Bo Merino, MD  Type: cyst (mucin) Location: Right 3rd PIP mucin cyst.  Sedation: Patient sedated: no  Needle gauge: 18 Incision depth: subcutaneous Complexity: simple Drainage amount: scant Patient tolerance: Patient tolerated the procedure well with no immediate complications    Allergies: Patient has no known allergies.   Assessment / Plan:     Visit Diagnoses: Psoriasis: Patient is being followed by dermatologist regularly.  He recently started using triamcinolone topical cream, which has helped in resolving his current active patches on his bilateral lower extremities. He will  continue being followed by his dermatologist.     HLA B27 positive  Plantar fasciitis: Symptoms have improved.  He will continue performing exercises.  Proper footwear was discussed.    Bilateral calcaneal spurs: He continues to have discomfort in his left heel.  Discussed the presence of bilateral spurs on x-ray.    DDD (degenerative disc disease), lumbar: Slightly limited ROM.  Tenderness over SI joints.  Patient states he will continue to perform exercises at home.    Primary osteoarthritis of both hands: No synovitis on exam.  He has synovial thickening of DIP and PIP joints consistent with OA.  Discussed hand strengthening and joint protection.    Primary osteoarthritis of both feet:  No synovitis on exam.  Left heel continues to cause discomfort with activity.  X-rays of his feet were normal.    Tendinopathy of right shoulder: Continues to have discomfort.  No spurring noted on x-ray.  No glenohumeral joint space narrowing.  Discussed muscle strengthening.    Right shoulder pain, unspecified chronicity -X-rays were ordered and performed today.  No bony abnormalities or joint space narrowing noted.  Patient was given refill of Voltaren gel, which he can use on his shoulder. Plan: XR Shoulder Right  Right knee pain, unspecified chronicity - X-ray performed today revealed mild chondromalacia patella.  Discussed joint protection and muscle strengthening.  Patient can use voltaren gel on his knee joint.  Plan: XR KNEE 3 VIEW RIGHT  Pain in right foot - Normal x-ray of right foot. Plan: XR Foot 2 Views Right  Pain in left foot - Normal x-ray of left foot. Plan: XR Foot 2 Views Left  Digital mucinous cyst: right 3rd PIP mucin cyst present.  The cyst was aspirated in the office.  The risks and benefits of the aspiration were discussed.  He tolerated the aspiration well.  Discussed wound care and to avoid submersion in dirty water for 3 days. In case he has recurrence of the cyst he will need  surgical resection. He will notify us.  Other medical conditions are listed as follows:   History of gastroesophageal reflux (GERD)  History of hypercholesterolemia  History of asthma  History of anxiety  History of hypertension  History of basal cell carcinoma: Being followed by dermatologist.   History of hyperlipidemia    Orders: Orders Placed This Encounter  Procedures  . Incision & Drainage  . XR Foot 2 Views Right  . XR Foot 2 Views Left  . XR Shoulder Right  . XR KNEE 3 VIEW RIGHT   Meds ordered this encounter  Medications  . diclofenac sodium (VOLTAREN) 1 % GEL    Sig: 3 g to 3 large joints three times daily    Dispense:  3 Tube    Refill:  3    Face-to-face time spent with patient was 30 minutes. Greater than 50% of time was spent in counseling and coordination of care.  Follow-Up Instructions: Return if symptoms worsen or fail to improve, for Osteoarthritis.   Note - This record has been created using [employer].  Chart creation errors have been sought, but may not always  have been located. Such creation errors do not reflect on  the standard of medical care.

## 2017-05-29 ENCOUNTER — Ambulatory Visit (INDEPENDENT_AMBULATORY_CARE_PROVIDER_SITE_OTHER): Payer: Self-pay

## 2017-05-29 ENCOUNTER — Ambulatory Visit (INDEPENDENT_AMBULATORY_CARE_PROVIDER_SITE_OTHER): Payer: 59 | Admitting: Rheumatology

## 2017-05-29 ENCOUNTER — Encounter: Payer: Self-pay | Admitting: Rheumatology

## 2017-05-29 VITALS — BP 139/99 | HR 88 | Resp 15 | Ht 71.5 in | Wt 191.0 lb

## 2017-05-29 DIAGNOSIS — M7731 Calcaneal spur, right foot: Secondary | ICD-10-CM | POA: Diagnosis not present

## 2017-05-29 DIAGNOSIS — Z8679 Personal history of other diseases of the circulatory system: Secondary | ICD-10-CM

## 2017-05-29 DIAGNOSIS — M67911 Unspecified disorder of synovium and tendon, right shoulder: Secondary | ICD-10-CM

## 2017-05-29 DIAGNOSIS — M19071 Primary osteoarthritis, right ankle and foot: Secondary | ICD-10-CM | POA: Diagnosis not present

## 2017-05-29 DIAGNOSIS — M79671 Pain in right foot: Secondary | ICD-10-CM | POA: Diagnosis not present

## 2017-05-29 DIAGNOSIS — Z8709 Personal history of other diseases of the respiratory system: Secondary | ICD-10-CM | POA: Diagnosis not present

## 2017-05-29 DIAGNOSIS — M5136 Other intervertebral disc degeneration, lumbar region: Secondary | ICD-10-CM

## 2017-05-29 DIAGNOSIS — M25511 Pain in right shoulder: Secondary | ICD-10-CM

## 2017-05-29 DIAGNOSIS — Z8719 Personal history of other diseases of the digestive system: Secondary | ICD-10-CM

## 2017-05-29 DIAGNOSIS — Z1589 Genetic susceptibility to other disease: Secondary | ICD-10-CM | POA: Diagnosis not present

## 2017-05-29 DIAGNOSIS — M79672 Pain in left foot: Secondary | ICD-10-CM

## 2017-05-29 DIAGNOSIS — Z8639 Personal history of other endocrine, nutritional and metabolic disease: Secondary | ICD-10-CM | POA: Diagnosis not present

## 2017-05-29 DIAGNOSIS — M25561 Pain in right knee: Secondary | ICD-10-CM

## 2017-05-29 DIAGNOSIS — M722 Plantar fascial fibromatosis: Secondary | ICD-10-CM

## 2017-05-29 DIAGNOSIS — L409 Psoriasis, unspecified: Secondary | ICD-10-CM | POA: Diagnosis not present

## 2017-05-29 DIAGNOSIS — Z8659 Personal history of other mental and behavioral disorders: Secondary | ICD-10-CM | POA: Diagnosis not present

## 2017-05-29 DIAGNOSIS — M67449 Ganglion, unspecified hand: Secondary | ICD-10-CM

## 2017-05-29 DIAGNOSIS — M19072 Primary osteoarthritis, left ankle and foot: Secondary | ICD-10-CM

## 2017-05-29 DIAGNOSIS — M19042 Primary osteoarthritis, left hand: Secondary | ICD-10-CM

## 2017-05-29 DIAGNOSIS — M51369 Other intervertebral disc degeneration, lumbar region without mention of lumbar back pain or lower extremity pain: Secondary | ICD-10-CM

## 2017-05-29 DIAGNOSIS — M7732 Calcaneal spur, left foot: Secondary | ICD-10-CM

## 2017-05-29 DIAGNOSIS — M19041 Primary osteoarthritis, right hand: Secondary | ICD-10-CM

## 2017-05-29 DIAGNOSIS — Z85828 Personal history of other malignant neoplasm of skin: Secondary | ICD-10-CM

## 2017-05-29 MED ORDER — DICLOFENAC SODIUM 1 % TD GEL: TRANSDERMAL | 3 refills | Status: DC

## 2017-06-26 DIAGNOSIS — J3081 Allergic rhinitis due to animal (cat) (dog) hair and dander: Secondary | ICD-10-CM | POA: Diagnosis not present

## 2017-06-26 DIAGNOSIS — J3089 Other allergic rhinitis: Secondary | ICD-10-CM | POA: Diagnosis not present

## 2017-06-26 DIAGNOSIS — J301 Allergic rhinitis due to pollen: Secondary | ICD-10-CM | POA: Diagnosis not present

## 2017-07-10 ENCOUNTER — Other Ambulatory Visit: Payer: 59

## 2017-07-10 ENCOUNTER — Other Ambulatory Visit: Payer: Self-pay | Admitting: Physician Assistant

## 2017-07-10 DIAGNOSIS — J301 Allergic rhinitis due to pollen: Secondary | ICD-10-CM | POA: Diagnosis not present

## 2017-07-10 DIAGNOSIS — J3081 Allergic rhinitis due to animal (cat) (dog) hair and dander: Secondary | ICD-10-CM | POA: Diagnosis not present

## 2017-07-10 DIAGNOSIS — R748 Abnormal levels of other serum enzymes: Secondary | ICD-10-CM | POA: Diagnosis not present

## 2017-07-10 DIAGNOSIS — J3089 Other allergic rhinitis: Secondary | ICD-10-CM | POA: Diagnosis not present

## 2017-07-13 LAB — HEPATIC FUNCTION PANEL
AG Ratio: 1.7 (calc) (ref 1.0–2.5)
ALKALINE PHOSPHATASE (APISO): 45 U/L (ref 40–115)
ALT: 49 U/L — ABNORMAL HIGH (ref 9–46)
AST: 38 U/L — AB (ref 10–35)
Albumin: 4.7 g/dL (ref 3.6–5.1)
BILIRUBIN INDIRECT: 1 mg/dL (ref 0.2–1.2)
Bilirubin, Direct: 0.3 mg/dL — ABNORMAL HIGH (ref 0.0–0.2)
Globulin: 2.8 g/dL (calc) (ref 1.9–3.7)
TOTAL PROTEIN: 7.5 g/dL (ref 6.1–8.1)
Total Bilirubin: 1.3 mg/dL — ABNORMAL HIGH (ref 0.2–1.2)

## 2017-07-13 LAB — ANTI-DNA ANTIBODY, DOUBLE-STRANDED: ds DNA Ab: <1 IU/mL

## 2017-07-13 LAB — ANA: Anti Nuclear Antibody(ANA): NEGATIVE

## 2017-07-13 LAB — GAMMA GT: GGT: 36 U/L (ref 3–95)

## 2017-07-13 LAB — FERRITIN: Ferritin: 306 ng/mL (ref 20–380)

## 2017-07-31 DIAGNOSIS — J3081 Allergic rhinitis due to animal (cat) (dog) hair and dander: Secondary | ICD-10-CM | POA: Diagnosis not present

## 2017-07-31 DIAGNOSIS — J3089 Other allergic rhinitis: Secondary | ICD-10-CM | POA: Diagnosis not present

## 2017-07-31 DIAGNOSIS — J301 Allergic rhinitis due to pollen: Secondary | ICD-10-CM | POA: Diagnosis not present

## 2017-08-14 DIAGNOSIS — J3081 Allergic rhinitis due to animal (cat) (dog) hair and dander: Secondary | ICD-10-CM | POA: Diagnosis not present

## 2017-08-14 DIAGNOSIS — J3089 Other allergic rhinitis: Secondary | ICD-10-CM | POA: Diagnosis not present

## 2017-08-14 DIAGNOSIS — J301 Allergic rhinitis due to pollen: Secondary | ICD-10-CM | POA: Diagnosis not present

## 2017-08-24 ENCOUNTER — Other Ambulatory Visit: Payer: Self-pay | Admitting: Adult Health

## 2017-08-26 DIAGNOSIS — T63441D Toxic effect of venom of bees, accidental (unintentional), subsequent encounter: Secondary | ICD-10-CM | POA: Diagnosis not present

## 2017-08-26 DIAGNOSIS — T63461D Toxic effect of venom of wasps, accidental (unintentional), subsequent encounter: Secondary | ICD-10-CM | POA: Diagnosis not present

## 2017-08-26 DIAGNOSIS — Z91030 Bee allergy status: Secondary | ICD-10-CM | POA: Diagnosis not present

## 2017-08-26 NOTE — Progress Notes (Signed)
Assessment and Plan:  Hypertension: Add on low dose ACE- Continue medication, monitor blood pressure at home. Continue DASH diet.  Reminder to go to the ER if any CP, SOB, nausea, dizziness, severe HA, changes vision/speech, left arm numbness and tingling, and jaw pain. Cholesterol: Will switch from 3 days a week to lower dose daily to increase compliance.  Continue diet and exercise. Check cholesterol.  Hypogonadism- continue to monitor, states medication is not helping with symptoms of low T, will try androgel Vitamin D Def- check level and continue medications.   Continue diet and meds as discussed. Further disposition pending results of labs. Future Appointments  Date Time Provider [place]  02/26/2018  9:30 AM [name], PA-C [facility] None    HPI 57 y.o. male  presents for 3 month follow up with hypertension, hyperlipidemia, prediabetes and vitamin D.  Getting basal [facility].    His blood pressure has been monitored at home, BP has been 140-160's at home, states has always had high numbers, today their BP is BP: 122/84    He does workout. He denies chest pain, shortness of breath, dizziness.  He is on cholesterol medication, lipitor 1/2 tab 3 x a day but states he forgets to take it frequently and denies myalgias. His cholesterol is at goal. The cholesterol last visit was:  Lab Results  Component Value Date   CHOL 164 02/20/2017   HDL 47 02/20/2017   LDLCALC 91 02/20/2017   TRIG 156 (H) 02/20/2017   CHOLHDL 3.5 02/20/2017   Last A1C in the office was:  Lab Results  Component Value Date   HGBA1C 5.4 02/09/2015  Patient is on Vitamin D supplement.   Lab Results  Component Value Date   VD25OH 47 02/20/2017   He has a history of testosterone deficiency and is on testosterone replacement, testim 1 tube a day but states that it is not helping energy, libido and would like to try another gel..  Lab Results   Component Value Date   TESTOSTERONE 668 02/20/2017   BMI is Body mass index is 26.35 kg/m., he is working on diet and exercise. Wt Readings from Last 3 Encounters:  08/28/17 191 lb 9.6 oz (86.9 kg)  05/29/17 191 lb (86.6 kg)  02/20/17 186 lb (84.4 kg)     Current Medications:  Current Outpatient Medications on File Prior to Visit  Medication Sig Dispense Refill  . albuterol (VENTOLIN HFA) 108 (90 BASE) MCG/ACT inhaler Inhale 2 puffs into the lungs every 4 (four) hours as needed for wheezing or shortness of breath. 1 Inhaler 3  . ALPRAZolam (XANAX) 0.5 MG tablet Take 1 tablet (0.5 mg total) by mouth 2 (two) times daily as needed for anxiety or sleep. 60 tablet 0  . ANDROGEL PUMP 20.25 MG/ACT (1.62%) GEL APPLY 2 PUMPS TO EACH ARM DAILY. 150 g 5  . aspirin 81 MG chewable tablet Chew by mouth daily.    Marland Kitchen atorvastatin (LIPITOR) 40 MG tablet TAKE ONE TABLET AT BEDTIME. 30 tablet 1  . diclofenac sodium (VOLTAREN) 1 % GEL 3 g to 3 large joints three times daily 3 Tube 3  . FLECTOR 1.3 % PTCH APPLY ONE PATCH TOPICALLY TWICE A DAY AS DIRECTED. 180 patch 1  . loratadine (CLARITIN) 10 MG tablet Take 10 mg by mouth daily.    . meloxicam (MOBIC) 15 MG tablet TAKE 1 TABLET ONCE DAILY AS NEEDED FOR PAIN WITH FOOD. 90 tablet 1  .  Omega-3 Fatty Acids (FISH OIL PO) Take by mouth daily.    Marland Kitchen omeprazole (PRILOSEC) 20 MG capsule Take 20 mg by mouth daily as needed.    . triamcinolone cream (KENALOG) 0.1 % Apply 1 application topically 2 (two) times daily. 45 g 1  . ranitidine (ZANTAC) 300 MG tablet Take 1 tablet (300 mg total) by mouth at bedtime. 30 tablet 11   No current facility-administered medications on file prior to visit.    Medical History:  Past Medical History:  Diagnosis Date  . Allergy   . Anxiety   . Asthma   . Basal cell carcinoma   . Depression   . ED (erectile dysfunction)   . GERD (gastroesophageal reflux disease)   . Hyperlipidemia   . Hypertension   . Hypogonadism in male    . Osteoarthritis   . Vitamin D deficiency    Allergies: No Known Allergies   Review of Systems:  Review of Systems  Constitutional: Negative.   HENT: Negative.   Respiratory: Negative.   Cardiovascular: Negative.   Gastrointestinal: Positive for heartburn (depending on what he eats). Negative for abdominal pain, blood in stool, constipation, diarrhea, melena, nausea and vomiting.  Genitourinary: Negative.   Musculoskeletal: Positive for joint pain. Negative for back pain, falls, myalgias and neck pain.  Skin: Positive for rash. Negative for itching.  Neurological: Negative.   Psychiatric/Behavioral: Negative.     Family history- Review and unchanged Social history- Review and unchanged Physical Exam: BP 122/84   Pulse 81   Temp (!) 97.2 F (36.2 C)   Ht 5' 11.5" (1.816 m)   Wt 191 lb 9.6 oz (86.9 kg)   SpO2 97%   BMI 26.35 kg/m  Wt Readings from Last 3 Encounters:  08/28/17 191 lb 9.6 oz (86.9 kg)  05/29/17 191 lb (86.6 kg)  02/20/17 186 lb (84.4 kg)   General Appearance: Well nourished, in no apparent distress. Eyes: PERRLA, EOMs, conjunctiva no swelling or erythema Sinuses: No Frontal/maxillary tenderness ENT/Mouth: Ext aud canals clear, TMs without erythema, bulging. No erythema, swelling, or exudate on post pharynx.  Tonsils not swollen or erythematous. Hearing normal.  Neck: Supple, thyroid normal.  Respiratory: Respiratory effort normal, BS equal bilaterally without rales, rhonchi, wheezing or stridor.  Cardio: RRR with no MRGs. Brisk peripheral pulses without edema.  Abdomen: Soft, + BS,  Non tender, no guarding, rebound, hernias, masses. Lymphatics: Non tender without lymphadenopathy.  Musculoskeletal: Full ROM, 5/5 strength, Normal gait Skin:  Warm, dry without , lesions, ecchymosis.  Neuro: Cranial nerves intact. Normal muscle tone, no cerebellar symptoms. Psych: Awake and oriented X 3, normal affect, Insight and Judgment appropriate.    [name],  PA-C 8:57 AM [facility]

## 2017-08-28 ENCOUNTER — Encounter: Payer: Self-pay | Admitting: Physician Assistant

## 2017-08-28 ENCOUNTER — Ambulatory Visit: Payer: 59 | Admitting: Physician Assistant

## 2017-08-28 VITALS — BP 122/84 | HR 81 | Temp 97.2°F | Ht 71.5 in | Wt 191.6 lb

## 2017-08-28 DIAGNOSIS — E785 Hyperlipidemia, unspecified: Secondary | ICD-10-CM | POA: Diagnosis not present

## 2017-08-28 DIAGNOSIS — D225 Melanocytic nevi of trunk: Secondary | ICD-10-CM | POA: Diagnosis not present

## 2017-08-28 DIAGNOSIS — F32A Depression, unspecified: Secondary | ICD-10-CM

## 2017-08-28 DIAGNOSIS — F329 Major depressive disorder, single episode, unspecified: Secondary | ICD-10-CM

## 2017-08-28 DIAGNOSIS — J3081 Allergic rhinitis due to animal (cat) (dog) hair and dander: Secondary | ICD-10-CM | POA: Diagnosis not present

## 2017-08-28 DIAGNOSIS — L723 Sebaceous cyst: Secondary | ICD-10-CM | POA: Diagnosis not present

## 2017-08-28 DIAGNOSIS — I10 Essential (primary) hypertension: Secondary | ICD-10-CM | POA: Diagnosis not present

## 2017-08-28 DIAGNOSIS — L821 Other seborrheic keratosis: Secondary | ICD-10-CM | POA: Diagnosis not present

## 2017-08-28 DIAGNOSIS — J301 Allergic rhinitis due to pollen: Secondary | ICD-10-CM | POA: Diagnosis not present

## 2017-08-28 DIAGNOSIS — J3089 Other allergic rhinitis: Secondary | ICD-10-CM | POA: Diagnosis not present

## 2017-08-28 MED ORDER — ENALAPRIL MALEATE 5 MG PO TABS: 5.0000 mg | ORAL_TABLET | Freq: Every day | ORAL | 2 refills | Status: DC

## 2017-08-28 MED ORDER — TADALAFIL 20 MG PO TABS: 20.0000 mg | ORAL_TABLET | Freq: Every day | ORAL | 2 refills | Status: DC | PRN

## 2017-08-28 MED ORDER — ATORVASTATIN CALCIUM 10 MG PO TABS: 10.0000 mg | ORAL_TABLET | Freq: Every day | ORAL | 1 refills | Status: DC

## 2017-08-28 NOTE — Patient Instructions (Signed)
ACE inhibitors are blood pressure medications that protect your heart and kidneys. It can cause two symptoms: The most common symptom is a dry cough/tickle in your throat that can happen the first day you take it or 5 years after you have been taking it. Please call us if you have this and we can switch it to a different medications. The least common side effect is called angioedema which is swelling of your lips and tongue and can cause problems with your breathing. This is a very very rare side effect but very serious. If this happens please stop the medication and go to the ER.   Monitor your blood pressure at home, please keep a record and bring that in with you to your next office visit.   Go to the ER if any CP, SOB, nausea, dizziness, severe HA, changes vision/speech  Due to a recent study, SPRINT, we have changed our goal for the systolic or top blood pressure number. Ideally we want your top number at 120.  In the [facility] Trial, 5000 people were randomized to a goal BP of 120 and 5000 people were randomized to a goal BP of less than 140. The patients with the goal BP at 120 had LESS DEMENTIA, LESS HEART ATTACKS, AND LESS STROKES, AS WELL AS OVERALL DECREASED MORTALITY OR DEATH RATE.   If you are willing, our goal BP is the top number of 120.  Your most recent BP: BP: 122/84   Take your medications faithfully as instructed. Maintain a healthy weight. Get at least 150 minutes of aerobic exercise per week. Minimize salt intake. Minimize alcohol intake  DASH Eating Plan DASH stands for "Dietary Approaches to Stop Hypertension." The DASH eating plan is a healthy eating plan that has been shown to reduce high blood pressure (hypertension). Additional health benefits may include reducing the risk of type 2 diabetes mellitus, heart disease, and stroke. The DASH eating plan may also help with weight loss. WHAT DO I NEED TO KNOW ABOUT THE DASH EATING PLAN? For the DASH eating plan, you will  follow these general guidelines:  Choose foods with a percent daily value for sodium of less than 5% (as listed on the food label).  Use salt-free seasonings or herbs instead of table salt or sea salt.  Check with your health care provider or pharmacist before using salt substitutes.  Eat lower-sodium products, often labeled as "lower sodium" or "no salt added."  Eat fresh foods.  Eat more vegetables, fruits, and low-fat dairy products.  Choose whole grains. Look for the word "whole" as the first word in the ingredient list.  Choose fish and skinless chicken or [place] more often than red meat. Limit fish, poultry, and meat to 6 oz (170 g) each day.  Limit sweets, desserts, sugars, and sugary drinks.  Choose heart-healthy fats.  Limit cheese to 1 oz (28 g) per day.  Eat more home-cooked food and less restaurant, buffet, and fast food.  Limit fried foods.  Cook foods using methods other than frying.  Limit canned vegetables. If you do use them, rinse them well to decrease the sodium.  When eating at a restaurant, ask that your food be prepared with less salt, or no salt if possible. WHAT FOODS CAN I EAT? Seek help from a dietitian for individual calorie needs. Grains Whole grain or whole wheat bread. Brown rice. Whole grain or whole wheat pasta. Quinoa, bulgur, and whole grain cereals. Low-sodium cereals. Corn or whole wheat flour tortillas. Whole  grain cornbread. Whole grain crackers. Low-sodium crackers. Vegetables Fresh or frozen vegetables (raw, steamed, roasted, or grilled). Low-sodium or reduced-sodium tomato and vegetable juices. Low-sodium or reduced-sodium tomato sauce and paste. Low-sodium or reduced-sodium canned vegetables.  Fruits All fresh, canned (in natural juice), or frozen fruits. Meat and Other Protein Products Ground beef (85% or leaner), grass-fed beef, or beef trimmed of fat. Skinless chicken or [place]. Ground chicken or [place]. Pork trimmed of fat. All  fish and seafood. Eggs. Dried beans, peas, or lentils. Unsalted nuts and seeds. Unsalted canned beans. Dairy Low-fat dairy products, such as skim or 1% milk, 2% or reduced-fat cheeses, low-fat ricotta or cottage cheese, or plain low-fat yogurt. Low-sodium or reduced-sodium cheeses. Fats and Oils Tub margarines without trans fats. Light or reduced-fat mayonnaise and salad dressings (reduced sodium). Avocado. Safflower, olive, or canola oils. Natural peanut or almond butter. Other Unsalted popcorn and pretzels. The items listed above may not be a complete list of recommended foods or beverages. Contact your dietitian for more options. WHAT FOODS ARE NOT RECOMMENDED? Grains White bread. White pasta. White rice. Refined cornbread. Bagels and croissants. Crackers that contain trans fat. Vegetables Creamed or fried vegetables. Vegetables in a cheese sauce. Regular canned vegetables. Regular canned tomato sauce and paste. Regular tomato and vegetable juices. Fruits Dried fruits. Canned fruit in light or heavy syrup. Fruit juice. Meat and Other Protein Products Fatty cuts of meat. Ribs, chicken wings, bacon, sausage, bologna, salami, chitterlings, fatback, hot dogs, bratwurst, and packaged luncheon meats. Salted nuts and seeds. Canned beans with salt. Dairy Whole or 2% milk, cream, half-and-half, and cream cheese. Whole-fat or sweetened yogurt. Full-fat cheeses or blue cheese. Nondairy creamers and whipped toppings. Processed cheese, cheese spreads, or cheese curds. Condiments Onion and garlic salt, seasoned salt, table salt, and sea salt. Canned and packaged gravies. Worcestershire sauce. Tartar sauce. Barbecue sauce. Teriyaki sauce. Soy sauce, including reduced sodium. Steak sauce. Fish sauce. Oyster sauce. Cocktail sauce. Horseradish. Ketchup and mustard. Meat flavorings and tenderizers. Bouillon cubes. Hot sauce. Tabasco sauce. Marinades. Taco seasonings. Relishes. Fats and Oils Butter, stick  margarine, lard, shortening, ghee, and bacon fat. Coconut, palm kernel, or palm oils. Regular salad dressings. Other Pickles and olives. Salted popcorn and pretzels. The items listed above may not be a complete list of foods and beverages to avoid. Contact your dietitian for more information. WHERE CAN I FIND MORE INFORMATION? National Heart, Lung, and Blood Institute: travelstabloid.com Document Released: 05/22/2011 Document Revised: 10/17/2013 Document Reviewed: 04/06/2013 [facility] Patient Information 2015 [place], [place]. This information is not intended to replace advice given to you by your health care provider. Make sure you discuss any questions you have with your health care provider.

## 2017-08-29 LAB — HEPATIC FUNCTION PANEL
AG RATIO: 1.8 (calc) (ref 1.0–2.5)
ALKALINE PHOSPHATASE (APISO): 42 U/L (ref 40–115)
ALT: 46 U/L (ref 9–46)
AST: 32 U/L (ref 10–35)
Albumin: 4.5 g/dL (ref 3.6–5.1)
BILIRUBIN DIRECT: 0.3 mg/dL — AB (ref 0.0–0.2)
BILIRUBIN INDIRECT: 0.9 mg/dL (ref 0.2–1.2)
GLOBULIN: 2.5 g/dL (ref 1.9–3.7)
TOTAL PROTEIN: 7 g/dL (ref 6.1–8.1)
Total Bilirubin: 1.2 mg/dL (ref 0.2–1.2)

## 2017-08-29 LAB — LIPID PANEL
CHOL/HDL RATIO: 3.4 (calc) (ref <5.0)
Cholesterol: 153 mg/dL (ref <200)
HDL: 45 mg/dL (ref >40)
LDL CHOLESTEROL (CALC): 89 mg/dL
NON-HDL CHOLESTEROL (CALC): 108 mg/dL (ref <130)
Triglycerides: 93 mg/dL (ref <150)

## 2017-08-29 LAB — TSH: TSH: 1.31 mIU/L (ref 0.40–4.50)

## 2017-08-29 LAB — CBC WITH DIFFERENTIAL/PLATELET
BASOS PCT: 0.8 %
Basophils Absolute: 38 cells/uL (ref 0–200)
EOS PCT: 5 %
Eosinophils Absolute: 240 cells/uL (ref 15–500)
HEMATOCRIT: 45.3 % (ref 38.5–50.0)
Hemoglobin: 15.7 g/dL (ref 13.2–17.1)
Lymphs Abs: 1325 cells/uL (ref 850–3900)
MCH: 31.3 pg (ref 27.0–33.0)
MCHC: 34.7 g/dL (ref 32.0–36.0)
MCV: 90.2 fL (ref 80.0–100.0)
MPV: 11.1 fL (ref 7.5–12.5)
Monocytes Relative: 8.3 %
NEUTROS PCT: 58.3 %
Neutro Abs: 2798 cells/uL (ref 1500–7800)
PLATELETS: 215 10*3/uL (ref 140–400)
RBC: 5.02 10*6/uL (ref 4.20–5.80)
RDW: 12.1 % (ref 11.0–15.0)
Total Lymphocyte: 27.6 %
WBC mixed population: 398 cells/uL (ref 200–950)
WBC: 4.8 10*3/uL (ref 3.8–10.8)

## 2017-08-29 LAB — BASIC METABOLIC PANEL WITH GFR
BUN: 14 mg/dL (ref 7–25)
CO2: 27 mmol/L (ref 20–32)
CREATININE: 1 mg/dL (ref 0.70–1.33)
Calcium: 9.8 mg/dL (ref 8.6–10.3)
Chloride: 105 mmol/L (ref 98–110)
GFR, EST NON AFRICAN AMERICAN: 85 mL/min/{1.73_m2} (ref >=60)
GFR, Est African American: 98 mL/min/{1.73_m2} (ref >=60)
Glucose, Bld: 97 mg/dL (ref 65–99)
POTASSIUM: 4.4 mmol/L (ref 3.5–5.3)
SODIUM: 140 mmol/L (ref 135–146)

## 2017-09-03 DIAGNOSIS — J3089 Other allergic rhinitis: Secondary | ICD-10-CM | POA: Diagnosis not present

## 2017-09-03 DIAGNOSIS — C4441 Basal cell carcinoma of skin of scalp and neck: Secondary | ICD-10-CM | POA: Diagnosis not present

## 2017-09-03 DIAGNOSIS — J3081 Allergic rhinitis due to animal (cat) (dog) hair and dander: Secondary | ICD-10-CM | POA: Diagnosis not present

## 2017-09-03 DIAGNOSIS — L905 Scar conditions and fibrosis of skin: Secondary | ICD-10-CM | POA: Diagnosis not present

## 2017-09-03 DIAGNOSIS — J301 Allergic rhinitis due to pollen: Secondary | ICD-10-CM | POA: Diagnosis not present

## 2017-09-10 DIAGNOSIS — J3089 Other allergic rhinitis: Secondary | ICD-10-CM | POA: Diagnosis not present

## 2017-09-10 DIAGNOSIS — J301 Allergic rhinitis due to pollen: Secondary | ICD-10-CM | POA: Diagnosis not present

## 2017-09-10 DIAGNOSIS — J3081 Allergic rhinitis due to animal (cat) (dog) hair and dander: Secondary | ICD-10-CM | POA: Diagnosis not present

## 2017-09-18 DIAGNOSIS — J301 Allergic rhinitis due to pollen: Secondary | ICD-10-CM | POA: Diagnosis not present

## 2017-09-18 DIAGNOSIS — J3089 Other allergic rhinitis: Secondary | ICD-10-CM | POA: Diagnosis not present

## 2017-09-18 DIAGNOSIS — J3081 Allergic rhinitis due to animal (cat) (dog) hair and dander: Secondary | ICD-10-CM | POA: Diagnosis not present

## 2017-09-25 DIAGNOSIS — J301 Allergic rhinitis due to pollen: Secondary | ICD-10-CM | POA: Diagnosis not present

## 2017-09-25 DIAGNOSIS — J3089 Other allergic rhinitis: Secondary | ICD-10-CM | POA: Diagnosis not present

## 2017-09-25 DIAGNOSIS — J3081 Allergic rhinitis due to animal (cat) (dog) hair and dander: Secondary | ICD-10-CM | POA: Diagnosis not present

## 2017-10-01 DIAGNOSIS — J301 Allergic rhinitis due to pollen: Secondary | ICD-10-CM | POA: Diagnosis not present

## 2017-10-01 DIAGNOSIS — J3081 Allergic rhinitis due to animal (cat) (dog) hair and dander: Secondary | ICD-10-CM | POA: Diagnosis not present

## 2017-10-01 DIAGNOSIS — J3089 Other allergic rhinitis: Secondary | ICD-10-CM | POA: Diagnosis not present

## 2017-10-23 DIAGNOSIS — J3081 Allergic rhinitis due to animal (cat) (dog) hair and dander: Secondary | ICD-10-CM | POA: Diagnosis not present

## 2017-10-23 DIAGNOSIS — J301 Allergic rhinitis due to pollen: Secondary | ICD-10-CM | POA: Diagnosis not present

## 2017-10-23 DIAGNOSIS — J3089 Other allergic rhinitis: Secondary | ICD-10-CM | POA: Diagnosis not present

## 2017-10-30 DIAGNOSIS — J3081 Allergic rhinitis due to animal (cat) (dog) hair and dander: Secondary | ICD-10-CM | POA: Diagnosis not present

## 2017-10-30 DIAGNOSIS — J3089 Other allergic rhinitis: Secondary | ICD-10-CM | POA: Diagnosis not present

## 2017-10-30 DIAGNOSIS — J301 Allergic rhinitis due to pollen: Secondary | ICD-10-CM | POA: Diagnosis not present

## 2017-11-13 DIAGNOSIS — J301 Allergic rhinitis due to pollen: Secondary | ICD-10-CM | POA: Diagnosis not present

## 2017-11-13 DIAGNOSIS — J3081 Allergic rhinitis due to animal (cat) (dog) hair and dander: Secondary | ICD-10-CM | POA: Diagnosis not present

## 2017-11-13 DIAGNOSIS — J3089 Other allergic rhinitis: Secondary | ICD-10-CM | POA: Diagnosis not present

## 2017-11-19 DIAGNOSIS — J301 Allergic rhinitis due to pollen: Secondary | ICD-10-CM | POA: Diagnosis not present

## 2017-11-19 DIAGNOSIS — J3089 Other allergic rhinitis: Secondary | ICD-10-CM | POA: Diagnosis not present

## 2017-11-19 DIAGNOSIS — J3081 Allergic rhinitis due to animal (cat) (dog) hair and dander: Secondary | ICD-10-CM | POA: Diagnosis not present

## 2017-11-23 ENCOUNTER — Encounter: Payer: Self-pay | Admitting: Physician Assistant

## 2017-11-23 ENCOUNTER — Other Ambulatory Visit: Payer: Self-pay | Admitting: Adult Health

## 2017-11-23 MED ORDER — OLMESARTAN MEDOXOMIL 40 MG PO TABS: ORAL_TABLET | ORAL | 11 refills | Status: DC

## 2017-11-25 DIAGNOSIS — J3089 Other allergic rhinitis: Secondary | ICD-10-CM | POA: Diagnosis not present

## 2017-11-25 DIAGNOSIS — J3081 Allergic rhinitis due to animal (cat) (dog) hair and dander: Secondary | ICD-10-CM | POA: Diagnosis not present

## 2017-11-25 DIAGNOSIS — J301 Allergic rhinitis due to pollen: Secondary | ICD-10-CM | POA: Diagnosis not present

## 2017-12-04 DIAGNOSIS — J3089 Other allergic rhinitis: Secondary | ICD-10-CM | POA: Diagnosis not present

## 2017-12-04 DIAGNOSIS — J3081 Allergic rhinitis due to animal (cat) (dog) hair and dander: Secondary | ICD-10-CM | POA: Diagnosis not present

## 2017-12-04 DIAGNOSIS — J301 Allergic rhinitis due to pollen: Secondary | ICD-10-CM | POA: Diagnosis not present

## 2017-12-08 DIAGNOSIS — J3081 Allergic rhinitis due to animal (cat) (dog) hair and dander: Secondary | ICD-10-CM | POA: Diagnosis not present

## 2017-12-08 DIAGNOSIS — J3089 Other allergic rhinitis: Secondary | ICD-10-CM | POA: Diagnosis not present

## 2017-12-08 DIAGNOSIS — J301 Allergic rhinitis due to pollen: Secondary | ICD-10-CM | POA: Diagnosis not present

## 2017-12-10 ENCOUNTER — Other Ambulatory Visit: Payer: Self-pay | Admitting: Adult Health

## 2017-12-24 DIAGNOSIS — J3081 Allergic rhinitis due to animal (cat) (dog) hair and dander: Secondary | ICD-10-CM | POA: Diagnosis not present

## 2017-12-24 DIAGNOSIS — J3089 Other allergic rhinitis: Secondary | ICD-10-CM | POA: Diagnosis not present

## 2017-12-24 DIAGNOSIS — J301 Allergic rhinitis due to pollen: Secondary | ICD-10-CM | POA: Diagnosis not present

## 2018-01-08 DIAGNOSIS — J301 Allergic rhinitis due to pollen: Secondary | ICD-10-CM | POA: Diagnosis not present

## 2018-01-08 DIAGNOSIS — J3089 Other allergic rhinitis: Secondary | ICD-10-CM | POA: Diagnosis not present

## 2018-01-08 DIAGNOSIS — J3081 Allergic rhinitis due to animal (cat) (dog) hair and dander: Secondary | ICD-10-CM | POA: Diagnosis not present

## 2018-01-26 DIAGNOSIS — J3081 Allergic rhinitis due to animal (cat) (dog) hair and dander: Secondary | ICD-10-CM | POA: Diagnosis not present

## 2018-01-26 DIAGNOSIS — J301 Allergic rhinitis due to pollen: Secondary | ICD-10-CM | POA: Diagnosis not present

## 2018-01-26 DIAGNOSIS — J3089 Other allergic rhinitis: Secondary | ICD-10-CM | POA: Diagnosis not present

## 2018-02-06 ENCOUNTER — Other Ambulatory Visit: Payer: Self-pay | Admitting: Internal Medicine

## 2018-02-12 DIAGNOSIS — J301 Allergic rhinitis due to pollen: Secondary | ICD-10-CM | POA: Diagnosis not present

## 2018-02-12 DIAGNOSIS — J3081 Allergic rhinitis due to animal (cat) (dog) hair and dander: Secondary | ICD-10-CM | POA: Diagnosis not present

## 2018-02-12 DIAGNOSIS — J3089 Other allergic rhinitis: Secondary | ICD-10-CM | POA: Diagnosis not present

## 2018-02-24 NOTE — Progress Notes (Signed)
Complete Physical  Assessment and Plan: Essential hypertension - monitor at home, if still elevated we will send in alpha blocker, DASH diet, exercise and monitor at home. Call if greater than 130/80.  - CBC with Differential/Platelet - BASIC METABOLIC PANEL WITH GFR - Hepatic function panel - TSH - Urinalysis, Routine w reflex microscopic (not at [facility]) - Microalbumin / creatinine urine ratio   Hyperlipidemia -continue medications, check lipids, decrease fatty foods, increase activity.  - Lipid panel  Hypogonadism male Hypogonadism- continue replacement therapy, check testosterone levels as needed.  - Testosterone  Asthma, mild intermittent, uncomplicated  inhaler PRN  Depression, remission  xanax, takes rarely  Gastroesophageal reflux disease with esophagitis GERD- will try to get off PPiI given info for taper and zantac sent in  Erectile dysfunction, unspecified erectile dysfunction type meds PRN  Vitamin D deficiency - Vit D  25 hydroxy (rtn osteoporosis monitoring)  Medication management - Magnesium   History of basal cell carcinoma Follows Derm   Psoriasis Follows rheum.   Encounter for general adult medical examination with abnormal findings  Insomnia Sleep study and getting mouth piece    Discussed med's effects and SE's. Screening labs and tests as requested with regular follow-up as recommended.  Future Appointments  Date Time Provider [place]  03/07/2019  9:00 AM [name], PA-C [facility] None    HPI  His blood pressure has been controlled at home, he is off ACE due to cough, today their BP is BP: 120/90.  He does workout. He denies chest pain, shortness of breath, dizziness.  He has history of joint pain, has psoriasis and possible psoriatic arthritis, followed with Dr. Patrecia Pour, at this time he is only doing topical treatment and mobic PRN, no DMARD. He is on cholesterol medication, lipitor 1/2 pill 2 x a week and denies  myalgias. His cholesterol is at goal. The cholesterol last visit was:   Lab Results  Component Value Date   CHOL 153 08/28/2017   HDL 45 08/28/2017   LDLCALC 89 08/28/2017   TRIG 93 08/28/2017   CHOLHDL 3.4 08/28/2017    Last A1C in the office was:  Lab Results  Component Value Date   HGBA1C 5.4 02/09/2015   He is has GERD, takes protonix when he travels.  He has a history of testosterone deficiency and is on testosterone replacement. He states that the testosterone helps with his energy, libido, muscle mass. Lab Results  Component Value Date   TESTOSTERONE 668 02/20/2017   Lab Results  Component Value Date   PSA 1.1 02/14/2016   PSA 1.58 02/09/2015   PSA 1.22 02/03/2014   Patient is on Vitamin D supplement.   Lab Results  Component Value Date   VD25OH 47 02/20/2017      Current Medications:  Current Outpatient Medications on File Prior to Visit  Medication Sig Dispense Refill  . albuterol (VENTOLIN HFA) 108 (90 BASE) MCG/ACT inhaler Inhale 2 puffs into the lungs every 4 (four) hours as needed for wheezing or shortness of breath. 1 Inhaler 3  . ANDROGEL PUMP 20.25 MG/ACT (1.62%) GEL APPLY 2 PUMPS TO EACH ARM DAILY. 150 g 3  . aspirin 81 MG chewable tablet Chew by mouth daily.    . diclofenac sodium (VOLTAREN) 1 % GEL 3 g to 3 large joints three times daily 3 Tube 3  . FLECTOR 1.3 % PTCH APPLY ONE PATCH TOPICALLY TWICE A DAY AS DIRECTED. 180 patch 1  . loratadine (CLARITIN) 10 MG tablet Take 10  mg by mouth daily.    . meloxicam (MOBIC) 15 MG tablet TAKE 1 TABLET ONCE DAILY AS NEEDED FOR PAIN WITH FOOD. 90 tablet 1  . olmesartan (BENICAR) 40 MG tablet Take 1/2-1 tab daily as directed for BP goal <130/80 30 tablet 11  . Omega-3 Fatty Acids (FISH OIL PO) Take by mouth daily.    Marland Kitchen omeprazole (PRILOSEC) 20 MG capsule Take 20 mg by mouth daily as needed.    . ranitidine (ZANTAC) 300 MG tablet TAKE ONE TABLET AT BEDTIME. 30 tablet 0  . tadalafil (CIALIS) 20 MG tablet Take 1  tablet (20 mg total) by mouth daily as needed for erectile dysfunction. 10 tablet 2  . triamcinolone cream (KENALOG) 0.1 % Apply 1 application topically 2 (two) times daily. 45 g 1   No current facility-administered medications on file prior to visit.    Health Maintenance:  Immunization History  Administered Date(s) Administered  . Influenza Inj Mdck Quad Pf 04/24/2017  . Influenza Inj Mdck Quad With Preservative 02/26/2018  . Influenza Split 03/28/2013, 03/30/2014  . Influenza, Seasonal, Injecte, Preservative Fre 05/04/2015  . Tdap 02/20/2017   Tetanus: 2018 Pneumovax: Prevnar 13:  Flu vaccine: 2019 at allergist Zostavax: DEXA: Colonoscopy: 02/23/2015 EGD:  Medical History:  Patient Active Problem List   Diagnosis Date Noted  . History of basal cell carcinoma 02/09/2015  . Psoriasis 02/09/2015  . Hypogonadism male 08/18/2014  . 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 Hypertension   . Hyperlipidemia   . Allergy   . Asthma   . Depression   . GERD (gastroesophageal reflux disease)   . ED (erectile dysfunction)   . Plantar fasciitis 01/06/2012   Allergies Allergies  Allergen Reactions  . Enalapril Cough    SURGICAL HISTORY He  has a past surgical history that includes Vasectomy (2013); orthopedic surgery; Hernia repair; LASIK; and scalp biopsy. FAMILY HISTORY His family history includes Asthma in his father; Heart disease in his father and mother; Kidney disease in his father; Rheum arthritis in his father; Spondylitis in his brother; Uterine cancer in his mother. SOCIAL HISTORY He  reports that he has quit smoking. His smoking use included cigars. He has never used smokeless tobacco. He reports that he drinks alcohol. He reports that he does not use drugs.  Review of Systems  Constitutional: Negative.   HENT: Negative.   Eyes: Negative.   Respiratory: Negative.   Cardiovascular: Negative.   Gastrointestinal: Negative for abdominal pain, blood in stool, constipation,  diarrhea, heartburn, melena, nausea and vomiting.  Genitourinary: Negative.        Dribbling  Musculoskeletal: Negative for back pain, falls, joint pain, myalgias and neck pain.  Skin: Negative.   Neurological: Negative for dizziness, tingling (bilateral hands at night), tremors, sensory change, speech change, focal weakness, seizures and loss of consciousness.  Psychiatric/Behavioral: Negative.     Physical Exam: Estimated body mass index is 25.63 kg/m as calculated from the following:   Height as of this encounter: 6' (1.829 m).   Weight as of this encounter: 189 lb (85.7 kg). BP 120/90   Pulse 70   Temp (!) 97.5 F (36.4 C)   Resp 16   Ht 6' (1.829 m)   Wt 189 lb (85.7 kg)   SpO2 98%   BMI 25.63 kg/m  General Appearance: Well nourished, in no apparent distress. Eyes: PERRLA, EOMs, conjunctiva no swelling or erythema, normal fundi and vessels. Sinuses: No Frontal/maxillary tenderness ENT/Mouth: Ext aud canals clear, normal light reflex with TMs  without erythema, bulging. Good dentition. No erythema, swelling, or exudate on post pharynx. Tonsils not swollen or erythematous. Hearing normal.  Neck: Supple, thyroid normal. No bruits Respiratory: Respiratory effort normal, BS equal bilaterally without rales, rhonchi, wheezing or stridor. Cardio: RRR without murmurs, rubs or gallops. Brisk peripheral pulses without edema.  Chest: symmetric, with normal excursions and percussion. Abdomen: Soft, +BS. Non tender, no guarding, rebound, hernias, masses, or organomegaly.  Lymphatics: Non tender without lymphadenopathy.  Genitourinary: defer Musculoskeletal: Full ROM all peripheral extremities,5/5 strength, and normal gait. Skin: Warm, dry without rashes, lesions, ecchymosis.  Neuro: Cranial nerves intact, reflexes equal bilaterally. Normal muscle tone, no cerebellar symptoms. Sensation intact.  Psych: Awake and oriented X 3, normal affect, Insight and Judgment appropriate.   EKG:  defer AORTA SCAN: defer  [name] 9:54 AM

## 2018-02-26 ENCOUNTER — Ambulatory Visit (INDEPENDENT_AMBULATORY_CARE_PROVIDER_SITE_OTHER): Payer: 59 | Admitting: Physician Assistant

## 2018-02-26 ENCOUNTER — Telehealth: Payer: Self-pay | Admitting: Physician Assistant

## 2018-02-26 ENCOUNTER — Encounter: Payer: Self-pay | Admitting: Physician Assistant

## 2018-02-26 VITALS — BP 120/90 | HR 70 | Temp 97.5°F | Resp 16 | Ht 72.0 in | Wt 189.0 lb

## 2018-02-26 DIAGNOSIS — J45909 Unspecified asthma, uncomplicated: Secondary | ICD-10-CM

## 2018-02-26 DIAGNOSIS — N529 Male erectile dysfunction, unspecified: Secondary | ICD-10-CM

## 2018-02-26 DIAGNOSIS — F329 Major depressive disorder, single episode, unspecified: Secondary | ICD-10-CM

## 2018-02-26 DIAGNOSIS — K2100 Gastro-esophageal reflux disease with esophagitis, without bleeding: Secondary | ICD-10-CM

## 2018-02-26 DIAGNOSIS — E785 Hyperlipidemia, unspecified: Secondary | ICD-10-CM | POA: Diagnosis not present

## 2018-02-26 DIAGNOSIS — Z79899 Other long term (current) drug therapy: Secondary | ICD-10-CM

## 2018-02-26 DIAGNOSIS — F32A Depression, unspecified: Secondary | ICD-10-CM

## 2018-02-26 DIAGNOSIS — Z23 Encounter for immunization: Secondary | ICD-10-CM

## 2018-02-26 DIAGNOSIS — I10 Essential (primary) hypertension: Secondary | ICD-10-CM | POA: Diagnosis not present

## 2018-02-26 DIAGNOSIS — E291 Testicular hypofunction: Secondary | ICD-10-CM

## 2018-02-26 DIAGNOSIS — Z0000 Encounter for general adult medical examination without abnormal findings: Secondary | ICD-10-CM | POA: Diagnosis not present

## 2018-02-26 DIAGNOSIS — Z85828 Personal history of other malignant neoplasm of skin: Secondary | ICD-10-CM

## 2018-02-26 DIAGNOSIS — L409 Psoriasis, unspecified: Secondary | ICD-10-CM

## 2018-02-26 DIAGNOSIS — T7840XS Allergy, unspecified, sequela: Secondary | ICD-10-CM

## 2018-02-26 DIAGNOSIS — E559 Vitamin D deficiency, unspecified: Secondary | ICD-10-CM

## 2018-02-26 NOTE — Patient Instructions (Addendum)
Silent reflux: Not all heartburn burns......  What is LPR? Laryngopharyngeal reflux (LPR) or silent reflux is a condition in which acid that is made in the stomach travels up the esophagus (swallowing tube) and gets to the throat. Not everyone with reflux has a lot of heartburn or indigestion. In fact, many people with LPR never have heartburn. This is why LPR is called SILENT REFLUX, and the terms "Silent reflux" and "LPR" are often used interchangeably. Because LPR is silent, it is sometimes difficult to diagnose.  How can you tell if you have LPR?  . Chronic hoarseness- Some people have hoarseness that comes and goes . throat clearing  . Cough . It can cause shortness of breath and cause asthma like symptoms. . a feeling of a lump in the throat  . difficulty swallowing . a problem with too much nose and throat drainage.  . Some people will feel their esophagus spasm which feels like their heart beating hard and fast, this will usually be after a meal, at rest, or lying down at night.    How do I treat this? Treatment for LPR should be individualized, and your doctor will suggest the best treatment for you. Generally there are several treatments for LPR: . changing habits and diet to reduce reflux,  . medications to reduce stomach acid, and  . surgery to prevent reflux. Most people with LPR need to modify how and when they eat, as well as take some medication, to get well. Sometimes, nonprescription liquid antacids, such as Maalox, Gelucil and Mylanta are recommended. When used, these antacids should be taken four times each day - one tablespoon one hour after each meal and before bedtime. Dietary and lifestyle changes alone are not often enough to control LPR - medications that reduce stomach acid are also usually needed. These must be prescribed by our doctor.   TIPS FOR REDUCING REFLUX AND LPR Control your LIFE-STYLE and your DIET! . If you use tobacco, QUIT.  . Smoking makes you  reflux. After every cigarette you have some LPR.  . Don't wear clothing that is too tight, especially around the waist (trousers, corsets, belts).  . Do not lie down just after eating...in fact, do not eat within three hours of bedtime.  . You should be on a low-fat diet.  . Limit your intake of red meat.  . Limit your intake of butter.  . Avoid fried foods.  . Avoid chocolate  . Avoid cheese.  . Avoid eggs. . Specifically avoid caffeine (especially coffee and tea), soda pop (especially cola) and mints.  . Avoid alcoholic beverages, particularly in the evening.  

## 2018-02-26 NOTE — Telephone Encounter (Signed)
error 

## 2018-02-27 LAB — MICROALBUMIN / CREATININE URINE RATIO
Creatinine, Urine: 217 mg/dL (ref 20–320)
MICROALB UR: 1 mg/dL
MICROALB/CREAT RATIO: 5 ug/mg{creat} (ref <30)

## 2018-02-27 LAB — URINALYSIS, ROUTINE W REFLEX MICROSCOPIC
Bilirubin Urine: NEGATIVE
Glucose, UA: NEGATIVE
HGB URINE DIPSTICK: NEGATIVE
Ketones, ur: NEGATIVE
LEUKOCYTES UA: NEGATIVE
Nitrite: NEGATIVE
PROTEIN: NEGATIVE
Specific Gravity, Urine: 1.021 (ref 1.001–1.03)
pH: >=8.5 — AB (ref 5.0–8.0)

## 2018-02-27 LAB — CBC WITH DIFFERENTIAL/PLATELET
Basophils Absolute: 63 cells/uL (ref 0–200)
Basophils Relative: 1 %
EOS PCT: 3.2 %
Eosinophils Absolute: 202 cells/uL (ref 15–500)
HCT: 45.2 % (ref 38.5–50.0)
Hemoglobin: 15.2 g/dL (ref 13.2–17.1)
Lymphs Abs: 1304 cells/uL (ref 850–3900)
MCH: 31.5 pg (ref 27.0–33.0)
MCHC: 33.6 g/dL (ref 32.0–36.0)
MCV: 93.6 fL (ref 80.0–100.0)
MPV: 11.6 fL (ref 7.5–12.5)
Monocytes Relative: 8.6 %
NEUTROS ABS: 4190 {cells}/uL (ref 1500–7800)
NEUTROS PCT: 66.5 %
PLATELETS: 228 10*3/uL (ref 140–400)
RBC: 4.83 10*6/uL (ref 4.20–5.80)
RDW: 12 % (ref 11.0–15.0)
Total Lymphocyte: 20.7 %
WBC mixed population: 542 cells/uL (ref 200–950)
WBC: 6.3 10*3/uL (ref 3.8–10.8)

## 2018-02-27 LAB — COMPLETE METABOLIC PANEL WITH GFR
AG Ratio: 1.6 (calc) (ref 1.0–2.5)
ALBUMIN MSPROF: 4.7 g/dL (ref 3.6–5.1)
ALT: 46 U/L (ref 9–46)
AST: 35 U/L (ref 10–35)
Alkaline phosphatase (APISO): 40 U/L (ref 40–115)
BUN: 15 mg/dL (ref 7–25)
CO2: 29 mmol/L (ref 20–32)
CREATININE: 1.06 mg/dL (ref 0.70–1.33)
Calcium: 10.1 mg/dL (ref 8.6–10.3)
Chloride: 104 mmol/L (ref 98–110)
GFR, EST AFRICAN AMERICAN: 92 mL/min/{1.73_m2} (ref >=60)
GFR, Est Non African American: 79 mL/min/{1.73_m2} (ref >=60)
GLUCOSE: 99 mg/dL (ref 65–99)
Globulin: 2.9 g/dL (calc) (ref 1.9–3.7)
Potassium: 4.8 mmol/L (ref 3.5–5.3)
Sodium: 140 mmol/L (ref 135–146)
Total Bilirubin: 1.1 mg/dL (ref 0.2–1.2)
Total Protein: 7.6 g/dL (ref 6.1–8.1)

## 2018-02-27 LAB — LIPID PANEL
CHOL/HDL RATIO: 2.9 (calc) (ref <5.0)
CHOLESTEROL: 137 mg/dL (ref <200)
HDL: 47 mg/dL (ref >40)
LDL CHOLESTEROL (CALC): 72 mg/dL
Non-HDL Cholesterol (Calc): 90 mg/dL (calc) (ref <130)
Triglycerides: 97 mg/dL (ref <150)

## 2018-02-27 LAB — TESTOSTERONE: TESTOSTERONE: 420 ng/dL (ref 250–827)

## 2018-02-27 LAB — MAGNESIUM: MAGNESIUM: 1.9 mg/dL (ref 1.5–2.5)

## 2018-02-27 LAB — TSH: TSH: 1.13 m[IU]/L (ref 0.40–4.50)

## 2018-02-27 LAB — VITAMIN D 25 HYDROXY (VIT D DEFICIENCY, FRACTURES): VIT D 25 HYDROXY: 42 ng/mL (ref 30–100)

## 2018-03-12 ENCOUNTER — Other Ambulatory Visit: Payer: Self-pay | Admitting: Physician Assistant

## 2018-03-12 DIAGNOSIS — J3089 Other allergic rhinitis: Secondary | ICD-10-CM | POA: Diagnosis not present

## 2018-03-12 DIAGNOSIS — J301 Allergic rhinitis due to pollen: Secondary | ICD-10-CM | POA: Diagnosis not present

## 2018-03-12 DIAGNOSIS — J3081 Allergic rhinitis due to animal (cat) (dog) hair and dander: Secondary | ICD-10-CM | POA: Diagnosis not present

## 2018-04-01 DIAGNOSIS — J3089 Other allergic rhinitis: Secondary | ICD-10-CM | POA: Diagnosis not present

## 2018-04-01 DIAGNOSIS — J3081 Allergic rhinitis due to animal (cat) (dog) hair and dander: Secondary | ICD-10-CM | POA: Diagnosis not present

## 2018-04-01 DIAGNOSIS — J301 Allergic rhinitis due to pollen: Secondary | ICD-10-CM | POA: Diagnosis not present

## 2018-04-23 DIAGNOSIS — J301 Allergic rhinitis due to pollen: Secondary | ICD-10-CM | POA: Diagnosis not present

## 2018-04-23 DIAGNOSIS — J3089 Other allergic rhinitis: Secondary | ICD-10-CM | POA: Diagnosis not present

## 2018-04-23 DIAGNOSIS — J3081 Allergic rhinitis due to animal (cat) (dog) hair and dander: Secondary | ICD-10-CM | POA: Diagnosis not present

## 2018-04-26 ENCOUNTER — Other Ambulatory Visit: Payer: Self-pay | Admitting: Physician Assistant

## 2018-05-21 DIAGNOSIS — J3089 Other allergic rhinitis: Secondary | ICD-10-CM | POA: Diagnosis not present

## 2018-05-21 DIAGNOSIS — J3081 Allergic rhinitis due to animal (cat) (dog) hair and dander: Secondary | ICD-10-CM | POA: Diagnosis not present

## 2018-05-21 DIAGNOSIS — J301 Allergic rhinitis due to pollen: Secondary | ICD-10-CM | POA: Diagnosis not present

## 2018-06-07 DIAGNOSIS — J3089 Other allergic rhinitis: Secondary | ICD-10-CM | POA: Diagnosis not present

## 2018-06-07 DIAGNOSIS — J3081 Allergic rhinitis due to animal (cat) (dog) hair and dander: Secondary | ICD-10-CM | POA: Diagnosis not present

## 2018-06-07 DIAGNOSIS — J301 Allergic rhinitis due to pollen: Secondary | ICD-10-CM | POA: Diagnosis not present

## 2018-06-14 DIAGNOSIS — J3089 Other allergic rhinitis: Secondary | ICD-10-CM | POA: Diagnosis not present

## 2018-06-14 DIAGNOSIS — J3081 Allergic rhinitis due to animal (cat) (dog) hair and dander: Secondary | ICD-10-CM | POA: Diagnosis not present

## 2018-06-14 DIAGNOSIS — J301 Allergic rhinitis due to pollen: Secondary | ICD-10-CM | POA: Diagnosis not present

## 2018-06-26 ENCOUNTER — Other Ambulatory Visit: Payer: Self-pay | Admitting: Internal Medicine

## 2018-07-02 DIAGNOSIS — J3089 Other allergic rhinitis: Secondary | ICD-10-CM | POA: Diagnosis not present

## 2018-07-02 DIAGNOSIS — J301 Allergic rhinitis due to pollen: Secondary | ICD-10-CM | POA: Diagnosis not present

## 2018-07-02 DIAGNOSIS — J3081 Allergic rhinitis due to animal (cat) (dog) hair and dander: Secondary | ICD-10-CM | POA: Diagnosis not present

## 2018-07-07 ENCOUNTER — Other Ambulatory Visit: Payer: Self-pay | Admitting: Internal Medicine

## 2018-07-13 DIAGNOSIS — J3081 Allergic rhinitis due to animal (cat) (dog) hair and dander: Secondary | ICD-10-CM | POA: Diagnosis not present

## 2018-07-13 DIAGNOSIS — J301 Allergic rhinitis due to pollen: Secondary | ICD-10-CM | POA: Diagnosis not present

## 2018-07-13 DIAGNOSIS — J3089 Other allergic rhinitis: Secondary | ICD-10-CM | POA: Diagnosis not present

## 2018-08-20 ENCOUNTER — Telehealth: Payer: Self-pay | Admitting: Internal Medicine

## 2018-08-20 DIAGNOSIS — J3089 Other allergic rhinitis: Secondary | ICD-10-CM | POA: Diagnosis not present

## 2018-08-20 DIAGNOSIS — J301 Allergic rhinitis due to pollen: Secondary | ICD-10-CM | POA: Diagnosis not present

## 2018-08-20 DIAGNOSIS — J3081 Allergic rhinitis due to animal (cat) (dog) hair and dander: Secondary | ICD-10-CM | POA: Diagnosis not present

## 2018-08-20 NOTE — Telephone Encounter (Signed)
I have tried several times to reach patient to schedule follow up appointment at Dr. Idell Pickles request, due to this patient being on cholesterol and blood pressure medications. I have left voicemail's each time and have not received a call back from Stephen Campbell to schedule this recommended appointment. [name]

## 2018-08-20 NOTE — Telephone Encounter (Signed)
-----   Message from Unk Pinto, MD sent at 06/27/2018 12:24 PM EST -----  Due a chol, BP OV - is on about 8-10 meds

## 2018-09-06 ENCOUNTER — Other Ambulatory Visit: Payer: Self-pay | Admitting: Physician Assistant

## 2018-09-06 MED ORDER — ALPRAZOLAM 0.5 MG PO TABS: 0.5000 mg | ORAL_TABLET | Freq: Three times a day (TID) | ORAL | 0 refills | Status: DC | PRN

## 2018-09-06 NOTE — Progress Notes (Signed)
Patient called to request xanax, states he has been on it previously.  Will send in a small dose and send a message asking why the patient would like it at this time to see if he would benefit from a low dose SSRI.   Call into [facility]

## 2018-09-10 ENCOUNTER — Ambulatory Visit: Payer: 59

## 2018-10-22 ENCOUNTER — Other Ambulatory Visit: Payer: Self-pay | Admitting: Physician Assistant

## 2018-10-22 ENCOUNTER — Other Ambulatory Visit: Payer: Self-pay | Admitting: Internal Medicine

## 2018-10-22 DIAGNOSIS — K2100 Gastro-esophageal reflux disease with esophagitis, without bleeding: Secondary | ICD-10-CM

## 2018-10-22 MED ORDER — FAMOTIDINE 40 MG PO TABS: ORAL_TABLET | ORAL | 0 refills | Status: DC

## 2019-01-05 ENCOUNTER — Other Ambulatory Visit: Payer: Self-pay | Admitting: Physician Assistant

## 2019-01-05 MED ORDER — ATORVASTATIN CALCIUM 10 MG PO TABS: ORAL_TABLET | ORAL | 0 refills | Status: DC

## 2019-02-02 ENCOUNTER — Ambulatory Visit: Payer: 59 | Admitting: Physician Assistant

## 2019-02-02 ENCOUNTER — Encounter: Payer: Self-pay | Admitting: Physician Assistant

## 2019-02-02 ENCOUNTER — Other Ambulatory Visit: Payer: Self-pay

## 2019-02-02 VITALS — BP 136/90 | HR 74 | Temp 97.7°F | Wt 190.0 lb

## 2019-02-02 DIAGNOSIS — I10 Essential (primary) hypertension: Secondary | ICD-10-CM | POA: Diagnosis not present

## 2019-02-02 DIAGNOSIS — R079 Chest pain, unspecified: Secondary | ICD-10-CM

## 2019-02-02 DIAGNOSIS — E559 Vitamin D deficiency, unspecified: Secondary | ICD-10-CM

## 2019-02-02 DIAGNOSIS — F32A Depression, unspecified: Secondary | ICD-10-CM

## 2019-02-02 DIAGNOSIS — E785 Hyperlipidemia, unspecified: Secondary | ICD-10-CM

## 2019-02-02 DIAGNOSIS — F329 Major depressive disorder, single episode, unspecified: Secondary | ICD-10-CM | POA: Diagnosis not present

## 2019-02-02 DIAGNOSIS — E291 Testicular hypofunction: Secondary | ICD-10-CM

## 2019-02-02 DIAGNOSIS — Z79899 Other long term (current) drug therapy: Secondary | ICD-10-CM

## 2019-02-02 NOTE — Patient Instructions (Addendum)
Nonspecific Chest Pain, Adult Chest pain can be caused by many different conditions. It can be caused by a condition that is life-threatening and requires treatment right away. It can also be caused by something that is not life-threatening. If you have chest pain, it can be hard to know the difference, so it is important to get help right away to make sure that you do not have a serious condition. Some life-threatening causes of chest pain include:  Heart attack.  A tear in the body's main blood vessel (aortic dissection).  Inflammation around your heart (pericarditis).  A problem in the lungs, such as a blood clot (pulmonary embolism) or a collapsed lung (pneumothorax). Some non life-threatening causes of chest pain include:  Heartburn.  Anxiety or stress.  Damage to the bones, muscles, and cartilage that make up your chest wall.  Pneumonia or bronchitis.  Shingles infection (varicella-zoster virus). Chest pain can feel like:  Pain or discomfort on the surface of your chest or deep in your chest.  Crushing, pressure, aching, or squeezing pain.  Burning or tingling.  Dull or sharp pain that is worse when you move, cough, or take a deep breath.  Pain or discomfort that is also felt in your back, neck, jaw, shoulder, or arm, or pain that spreads to any of these areas. Your chest pain may come and go. It may also be constant. Your health care provider will do lab tests and other studies to find the cause of your pain. Treatment will depend on the cause of your chest pain. Follow these instructions at home: Medicines  Take over-the-counter and prescription medicines only as told by your health care provider.  If you were prescribed an antibiotic, take it as told by your health care provider. Do not stop taking the antibiotic even if you start to feel better. Lifestyle   Rest as directed by your health care provider.  Do not use any products that contain nicotine or tobacco,  such as cigarettes and e-cigarettes. If you need help quitting, ask your health care provider.  Do not drink alcohol.  Make healthy lifestyle choices as recommended. These may include: ? Getting regular exercise. Ask your health care provider to suggest some activities that are safe for you. ? Eating a heart-healthy diet. This includes plenty of fresh fruits and vegetables, whole grains, low-fat (lean) protein, and low-fat dairy products. A dietitian can help you find healthy eating options. ? Maintaining a healthy weight. ? Managing any other health conditions you have, such as high blood pressure (hypertension) or diabetes. ? Reducing stress, such as with yoga or relaxation techniques. General instructions  Pay attention to any changes in your symptoms. Tell your health care provider about them or any new symptoms.  Avoid any activities that cause chest pain.  Keep all follow-up visits as told by your health care provider. This is important. This includes visits for any further testing if your chest pain does not go away. Contact a health care provider if:  Your chest pain does not go away.  You feel depressed.  You have a fever. Get help right away if:  Your chest pain gets worse.  You have a cough that gets worse, or you cough up blood.  You have severe pain in your abdomen.  You faint.  You have sudden, unexplained chest discomfort.  You have sudden, unexplained discomfort in your arms, back, neck, or jaw.  You have shortness of breath at any time.  You suddenly start   to sweat, or your skin gets clammy.  You feel nausea or you vomit.  You suddenly feel lightheaded or dizzy.  You have severe weakness, or unexplained weakness or fatigue.  Your heart begins to beat quickly, or it feels like it is skipping beats. These symptoms may represent a serious problem that is an emergency. Do not wait to see if the symptoms will go away. Get medical help right away. Call your  local emergency services (911 in the [place] Do not drive yourself to the hospital. Summary  Chest pain can be caused by a condition that is serious and requires urgent treatment. It may also be caused by something that is not life-threatening.  If you have chest pain, it is very important to see your health care provider. Your health care provider may do lab tests and other studies to find the cause of your pain.  Follow your health care provider's instructions on taking medicines, making lifestyle changes, and getting emergency treatment if symptoms become worse.  Keep all follow-up visits as told by your health care provider. This includes visits for any further testing if your chest pain does not go away. This information is not intended to replace advice given to you by your health care provider. Make sure you discuss any questions you have with your health care provider. Document Released: 03/12/2005 Document Revised: 12/03/2017 Document Reviewed: 12/03/2017 Elsevier Patient Education  2020 [place]  The studies for CBD are better for seizure disorders than any other claims for CBD oil.  The biggest issues with it, is CBD oil is NOT regulated. A recent test of 18 over the counter CBD oil showed that 20% had TOO much, 60 % did not have the claimed amount, and 20 % had the claimed amount.   I have had some patients where it interacts with their medications as well and there is not a way for me to check the interactions since it is not a controlled substance.   With any other the counter medication OR supplement, if you try it, try to get from good source and if you have ANY abnormal symptoms over the next 3 months or don't see any benefits from it stop it.   The quality and quanity of the CBD oil varies greatly so I can not endorse patients taking it at this time.    HYPERTENSION INFORMATION  Monitor your blood pressure at home, please keep a record  and bring that in with you to your next office visit.   Go to the ER if any CP, SOB, nausea, dizziness, severe HA, changes vision/speech  Testing/Procedures: HOW TO TAKE YOUR BLOOD PRESSURE:  Rest 5 minutes before taking your blood pressure.  Don't smoke or drink caffeinated beverages for at least 30 minutes before.  Take your blood pressure before (not after) you eat.  Sit comfortably with your back supported and both feet on the floor (don't cross your legs).  Elevate your arm to heart level on a table or a desk.  Use the proper sized cuff. It should fit smoothly and snugly around your bare upper arm. There should be enough room to slip a fingertip under the cuff. The bottom edge of the cuff should be 1 inch above the crease of the elbow.  Due to a recent study, SPRINT, we have changed our goal for the systolic or top blood pressure number. Ideally we want your top number at 120.  In the [facility]  Trial, 5000 people were randomized to a goal BP of 120 and 5000 people were randomized to a goal BP of less than 140. The patients with the goal BP at 120 had LESS DEMENTIA, LESS HEART ATTACKS, AND LESS STROKES, AS WELL AS OVERALL DECREASED MORTALITY OR DEATH RATE.   There was another study that showed taking your blood pressure medications at night decrease cardiovascular events.  However if you are on a fluid pill, please take this in the morning.   If you are willing, our goal BP is the top number of 120.  Your most recent BP:     Take your medications faithfully as instructed. Maintain a healthy weight. Get at least 150 minutes of aerobic exercise per week. Minimize salt intake. Minimize alcohol intake  DASH Eating Plan DASH stands for "Dietary Approaches to Stop Hypertension." The DASH eating plan is a healthy eating plan that has been shown to reduce high blood pressure (hypertension). Additional health benefits may include reducing the risk of type 2 diabetes mellitus, heart  disease, and stroke. The DASH eating plan may also help with weight loss. WHAT DO I NEED TO KNOW ABOUT THE DASH EATING PLAN? For the DASH eating plan, you will follow these general guidelines:  Choose foods with a percent daily value for sodium of less than 5% (as listed on the food label).  Use salt-free seasonings or herbs instead of table salt or sea salt.  Check with your health care provider or pharmacist before using salt substitutes.  Eat lower-sodium products, often labeled as "lower sodium" or "no salt added."  Eat fresh foods.  Eat more vegetables, fruits, and low-fat dairy products.  Choose whole grains. Look for the word "whole" as the first word in the ingredient list.  Choose fish and skinless chicken or [place] more often than red meat. Limit fish, poultry, and meat to 6 oz (170 g) each day.  Limit sweets, desserts, sugars, and sugary drinks.  Choose heart-healthy fats.  Limit cheese to 1 oz (28 g) per day.  Eat more home-cooked food and less restaurant, buffet, and fast food.  Limit fried foods.  Cook foods using methods other than frying.  Limit canned vegetables. If you do use them, rinse them well to decrease the sodium.  When eating at a restaurant, ask that your food be prepared with less salt, or no salt if possible. WHAT FOODS CAN I EAT? Seek help from a dietitian for individual calorie needs. Grains Whole grain or whole wheat bread. Brown rice. Whole grain or whole wheat pasta. Quinoa, bulgur, and whole grain cereals. Low-sodium cereals. Corn or whole wheat flour tortillas. Whole grain cornbread. Whole grain crackers. Low-sodium crackers. Vegetables Fresh or frozen vegetables (raw, steamed, roasted, or grilled). Low-sodium or reduced-sodium tomato and vegetable juices. Low-sodium or reduced-sodium tomato sauce and paste. Low-sodium or reduced-sodium canned vegetables.  Fruits All fresh, canned (in natural juice), or frozen fruits. Meat and Other  Protein Products Ground beef (85% or leaner), grass-fed beef, or beef trimmed of fat. Skinless chicken or [place]. Ground chicken or [place]. Pork trimmed of fat. All fish and seafood. Eggs. Dried beans, peas, or lentils. Unsalted nuts and seeds. Unsalted canned beans. Dairy Low-fat dairy products, such as skim or 1% milk, 2% or reduced-fat cheeses, low-fat ricotta or cottage cheese, or plain low-fat yogurt. Low-sodium or reduced-sodium cheeses. Fats and Oils Tub margarines without trans fats. Light or reduced-fat mayonnaise and salad dressings (reduced sodium). Avocado. Safflower, olive, or canola oils. Natural peanut  or almond butter. Other Unsalted popcorn and pretzels. The items listed above may not be a complete list of recommended foods or beverages. Contact your dietitian for more options. WHAT FOODS ARE NOT RECOMMENDED? Grains White bread. White pasta. White rice. Refined cornbread. Bagels and croissants. Crackers that contain trans fat. Vegetables Creamed or fried vegetables. Vegetables in a cheese sauce. Regular canned vegetables. Regular canned tomato sauce and paste. Regular tomato and vegetable juices. Fruits Dried fruits. Canned fruit in light or heavy syrup. Fruit juice. Meat and Other Protein Products Fatty cuts of meat. Ribs, chicken wings, bacon, sausage, bologna, salami, chitterlings, fatback, hot dogs, bratwurst, and packaged luncheon meats. Salted nuts and seeds. Canned beans with salt. Dairy Whole or 2% milk, cream, half-and-half, and cream cheese. Whole-fat or sweetened yogurt. Full-fat cheeses or blue cheese. Nondairy creamers and whipped toppings. Processed cheese, cheese spreads, or cheese curds. Condiments Onion and garlic salt, seasoned salt, table salt, and sea salt. Canned and packaged gravies. Worcestershire sauce. Tartar sauce. Barbecue sauce. Teriyaki sauce. Soy sauce, including reduced sodium. Steak sauce. Fish sauce. Oyster sauce. Cocktail sauce. Horseradish.  Ketchup and mustard. Meat flavorings and tenderizers. Bouillon cubes. Hot sauce. Tabasco sauce. Marinades. Taco seasonings. Relishes. Fats and Oils Butter, stick margarine, lard, shortening, ghee, and bacon fat. Coconut, palm kernel, or palm oils. Regular salad dressings. Other Pickles and olives. Salted popcorn and pretzels. The items listed above may not be a complete list of foods and beverages to avoid. Contact your dietitian for more information. WHERE CAN I FIND MORE INFORMATION? National Heart, Lung, and Blood Institute: travelstabloid.com Document Released: 05/22/2011 Document Revised: 10/17/2013 Document Reviewed: 04/06/2013 [facility] Patient Information 2015 [place], [place]. This information is not intended to replace advice given to you by your health care provider. Make sure you discuss any questions you have with your health care provider.

## 2019-02-02 NOTE — Progress Notes (Signed)
Assessment and Plan:   Hyperlipidemia, unspecified hyperlipidemia type -     Lipid panel  Essential hypertension -     CBC with Differential/Platelet -     TSH -     COMPLETE METABOLIC PANEL WITH GFR  Depression, unspecified depression type Some worse with pandemic, working again with PPE and states it is difficult Continue medications, monitor xanax use  Hypogonadism male -     Testosterone  Medication management -     Magnesium  Vitamin D deficiency -     VITAMIN D 25 Hydroxy (Vit-D Deficiency, Fractures)  Chest pain, unspecified type With exertion, but goes away quickly, no accompaniments, no radiation, well localized.  Declines referral at this ttime Has had worsening GERd, stop ETOH, get on PPI, take tums before run and if not better he will call for referral.  Go to the ER if any chest pain, shortness of breath, nausea, dizziness, severe HA, changes vision/speech    Continue diet and meds as discussed. Further disposition pending results of labs. Future Appointments  Date Time Provider [place]  03/11/2019 10:30 AM [name], PA-C [id number] None    HPI 58 y.o. male  presents for 3 month follow up with hypertension, hyperlipidemia, prediabetes and vitamin D.   His blood pressure has NOT been monitored at home, today their BP is BP: 136/90    He does workout. He denies shortness of breath, dizziness. States for last month he has been having some very well localized chest discomfort, substernal, with running. No accompaniment, no radiation. Goes away when he slows down. States he has been having bad heart burn and drinking more alcohol as well.  Dad with valve replacement, mom with chest pain later in life but no family history of early CAD. He has HTN, cholesterol but otherwise low risk.    He is on cholesterol medication, lipitor 10mg  every day, has been back on x 3 weeks, and denies myalgias. His cholesterol is at goal. The cholesterol last visit  was:  Lab Results  Component Value Date   CHOL 137 02/26/2018   HDL 47 02/26/2018   LDLCALC 72 02/26/2018   TRIG 97 02/26/2018   CHOLHDL 2.9 02/26/2018   Last A1C in the office was:  Lab Results  Component Value Date   HGBA1C 5.4 02/09/2015  Patient is on Vitamin D supplement.   Lab Results  Component Value Date   VD25OH 69 02/26/2018    He has a history of testosterone deficiency and is on testosterone replacement, testim 1 tube a day but states that it is not helping energy, libido and would like to try another gel..  Lab Results  Component Value Date   TESTOSTERONE 420 02/26/2018   BMI is Body mass index is 25.77 kg/m., he is working on diet and exercise. Wt Readings from Last 3 Encounters:  02/02/19 190 lb (86.2 kg)  02/26/18 189 lb (85.7 kg)  08/28/17 191 lb 9.6 oz (86.9 kg)     Current Medications:  Current Outpatient Medications on File Prior to Visit  Medication Sig Dispense Refill  . albuterol (VENTOLIN HFA) 108 (90 BASE) MCG/ACT inhaler Inhale 2 puffs into the lungs every 4 (four) hours as needed for wheezing or shortness of breath. 1 Inhaler 3  . ALPRAZolam (XANAX) 0.5 MG tablet Take 1 tablet (0.5 mg total) by mouth 3 (three) times daily as needed for sleep or anxiety. 10 tablet 0  . ANDROGEL PUMP 20.25 MG/ACT (1.62%) GEL APPLY 2 PUMPS TO  EACH ARM DAILY. 150 g 3  . aspirin 81 MG chewable tablet Chew by mouth daily.    Marland Kitchen atorvastatin (LIPITOR) 10 MG tablet Take 1 tablet Daily for Cholesterol - keep sept appointment 90 tablet 0  . diclofenac sodium (VOLTAREN) 1 % GEL 3 g to 3 large joints three times daily 3 Tube 3  . famotidine (PEPCID) 40 MG tablet Take 1 tablet at bedtime for Acid Reflux - Last Office visit Sept 2019 - Must have visit before further refills 30 tablet 0  . FLECTOR 1.3 % PTCH APPLY ONE PATCH TOPICALLY TWICE A DAY AS DIRECTED. 180 patch 1  . loratadine (CLARITIN) 10 MG tablet Take 10 mg by mouth daily.    . meloxicam (MOBIC) 15 MG tablet Take 1/2  to 1 tablet daily with food for pain & inflammation Limit to 5 days /week to prevent Kidney Damage 30 tablet 0  . olmesartan (BENICAR) 40 MG tablet Take 1/2-1 tab daily as directed for BP goal <130/80 30 tablet 11  . Omega-3 Fatty Acids (FISH OIL PO) Take by mouth daily.    Marland Kitchen omeprazole (PRILOSEC) 20 MG capsule Take 20 mg by mouth daily as needed.    . tadalafil (CIALIS) 20 MG tablet Take 1 tablet (20 mg total) by mouth daily as needed for erectile dysfunction. 10 tablet 2  . triamcinolone cream (KENALOG) 0.1 % Apply 1 application topically 2 (two) times daily. 45 g 1   No current facility-administered medications on file prior to visit.    Medical History:  Past Medical History:  Diagnosis Date  . Allergy   . Anxiety   . Asthma   . Basal cell carcinoma   . Depression   . ED (erectile dysfunction)   . GERD (gastroesophageal reflux disease)   . Hyperlipidemia   . Hypertension   . Hypogonadism in male   . Osteoarthritis   . Vitamin D deficiency    Allergies:  Allergies  Allergen Reactions  . Enalapril Cough     Review of Systems:  Review of Systems  Constitutional: Negative.   HENT: Negative.   Respiratory: Negative.  Negative for cough, shortness of breath and wheezing.   Cardiovascular: Positive for chest pain. Negative for palpitations, orthopnea, claudication, leg swelling and PND.  Gastrointestinal: Positive for heartburn (depending on what he eats). Negative for abdominal pain, blood in stool, constipation, diarrhea, melena, nausea and vomiting.  Genitourinary: Negative.   Musculoskeletal: Positive for joint pain. Negative for back pain, falls, myalgias and neck pain.  Skin: Negative for itching and rash.  Neurological: Negative.   Psychiatric/Behavioral: Negative.     Family history- Review and unchanged Social history- Review and unchanged Physical Exam: BP 136/90   Pulse 74   Temp 97.7 F (36.5 C)   Wt 190 lb (86.2 kg)   SpO2 98%   BMI 25.77 kg/m  Wt  Readings from Last 3 Encounters:  02/02/19 190 lb (86.2 kg)  02/26/18 189 lb (85.7 kg)  08/28/17 191 lb 9.6 oz (86.9 kg)   General Appearance: Well nourished, in no apparent distress. Eyes: PERRLA, EOMs, conjunctiva no swelling or erythema Sinuses: No Frontal/maxillary tenderness ENT/Mouth: Ext aud canals clear, TMs without erythema, bulging. No erythema, swelling, or exudate on post pharynx.  Tonsils not swollen or erythematous. Hearing normal.  Neck: Supple, thyroid normal.  Respiratory: Respiratory effort normal, BS equal bilaterally without rales, rhonchi, wheezing or stridor.  Cardio: RRR with no MRGs. Brisk peripheral pulses without edema.  Abdomen: Soft, + BS,  Non tender, no guarding, rebound, hernias, masses. Lymphatics: Non tender without lymphadenopathy.  Musculoskeletal: Full ROM, 5/5 strength, Normal gait Skin:  Warm, dry without , lesions, ecchymosis.  Neuro: Cranial nerves intact. Normal muscle tone, no cerebellar symptoms. Psych: Awake and oriented X 3, normal affect, Insight and Judgment appropriate.    [name], PA-C 1:35 PM [facility]

## 2019-02-03 ENCOUNTER — Ambulatory Visit: Payer: 59

## 2019-02-03 LAB — COMPLETE METABOLIC PANEL WITH GFR
AG Ratio: 1.8 (calc) (ref 1.0–2.5)
ALT: 73 U/L — ABNORMAL HIGH (ref 9–46)
AST: 49 U/L — ABNORMAL HIGH (ref 10–35)
Albumin: 4.9 g/dL (ref 3.6–5.1)
Alkaline phosphatase (APISO): 44 U/L (ref 35–144)
BUN: 14 mg/dL (ref 7–25)
CO2: 25 mmol/L (ref 20–32)
Calcium: 10.6 mg/dL — ABNORMAL HIGH (ref 8.6–10.3)
Chloride: 101 mmol/L (ref 98–110)
Creat: 0.93 mg/dL (ref 0.70–1.33)
GFR, Est African American: 107 mL/min/{1.73_m2} (ref >=60)
GFR, Est Non African American: 92 mL/min/{1.73_m2} (ref >=60)
Globulin: 2.7 g/dL (calc) (ref 1.9–3.7)
Glucose, Bld: 93 mg/dL (ref 65–99)
Potassium: 4.3 mmol/L (ref 3.5–5.3)
Sodium: 139 mmol/L (ref 135–146)
Total Bilirubin: 1.1 mg/dL (ref 0.2–1.2)
Total Protein: 7.6 g/dL (ref 6.1–8.1)

## 2019-02-03 LAB — CBC WITH DIFFERENTIAL/PLATELET
Absolute Monocytes: 615 cells/uL (ref 200–950)
Basophils Absolute: 58 cells/uL (ref 0–200)
Basophils Relative: 1 %
Eosinophils Absolute: 168 cells/uL (ref 15–500)
Eosinophils Relative: 2.9 %
HCT: 47.6 % (ref 38.5–50.0)
Hemoglobin: 16.1 g/dL (ref 13.2–17.1)
Lymphs Abs: 1438 cells/uL (ref 850–3900)
MCH: 31.7 pg (ref 27.0–33.0)
MCHC: 33.8 g/dL (ref 32.0–36.0)
MCV: 93.7 fL (ref 80.0–100.0)
MPV: 11.3 fL (ref 7.5–12.5)
Monocytes Relative: 10.6 %
Neutro Abs: 3521 cells/uL (ref 1500–7800)
Neutrophils Relative %: 60.7 %
Platelets: 199 10*3/uL (ref 140–400)
RBC: 5.08 10*6/uL (ref 4.20–5.80)
RDW: 12.1 % (ref 11.0–15.0)
Total Lymphocyte: 24.8 %
WBC: 5.8 10*3/uL (ref 3.8–10.8)

## 2019-02-03 LAB — LIPID PANEL
Cholesterol: 184 mg/dL (ref <200)
HDL: 45 mg/dL (ref >=40)
LDL Cholesterol (Calc): 111 mg/dL (calc) — ABNORMAL HIGH
Non-HDL Cholesterol (Calc): 139 mg/dL (calc) — ABNORMAL HIGH (ref <130)
Total CHOL/HDL Ratio: 4.1 (calc) (ref <5.0)
Triglycerides: 162 mg/dL — ABNORMAL HIGH (ref <150)

## 2019-02-03 LAB — MAGNESIUM: Magnesium: 1.8 mg/dL (ref 1.5–2.5)

## 2019-02-03 LAB — TESTOSTERONE: Testosterone: 291 ng/dL (ref 250–827)

## 2019-02-03 LAB — VITAMIN D 25 HYDROXY (VIT D DEFICIENCY, FRACTURES): Vit D, 25-Hydroxy: 39 ng/mL (ref 30–100)

## 2019-02-03 LAB — TSH: TSH: 1.23 mIU/L (ref 0.40–4.50)

## 2019-02-08 ENCOUNTER — Other Ambulatory Visit: Payer: Self-pay | Admitting: Adult Health

## 2019-03-07 ENCOUNTER — Encounter: Payer: Self-pay | Admitting: Physician Assistant

## 2019-03-11 ENCOUNTER — Encounter: Payer: Self-pay | Admitting: Physician Assistant

## 2019-03-16 ENCOUNTER — Other Ambulatory Visit: Payer: Self-pay | Admitting: Internal Medicine

## 2019-03-16 ENCOUNTER — Other Ambulatory Visit: Payer: Self-pay | Admitting: Adult Health

## 2019-03-16 DIAGNOSIS — K2100 Gastro-esophageal reflux disease with esophagitis, without bleeding: Secondary | ICD-10-CM

## 2019-04-12 ENCOUNTER — Other Ambulatory Visit: Payer: Self-pay | Admitting: Physician Assistant

## 2019-04-12 MED ORDER — TESTOSTERONE 20.25 MG/ACT (1.62%) TD GEL: TRANSDERMAL | 3 refills | Status: DC

## 2019-04-23 ENCOUNTER — Other Ambulatory Visit: Payer: Self-pay | Admitting: Physician Assistant

## 2019-05-04 DIAGNOSIS — Z79899 Other long term (current) drug therapy: Secondary | ICD-10-CM | POA: Insufficient documentation

## 2019-05-04 DIAGNOSIS — E559 Vitamin D deficiency, unspecified: Secondary | ICD-10-CM | POA: Insufficient documentation

## 2019-05-04 NOTE — Progress Notes (Signed)
Complete Physical  Assessment and Plan: Essential hypertension - monitor at home, if still elevated we will send in alpha blocker, DASH diet, exercise and monitor at home. Call if greater than 130/80.  - CBC with Differential/Platelet - BASIC METABOLIC PANEL WITH GFR - Hepatic function panel - TSH - Urinalysis, Routine w reflex microscopic (not at [facility]) - Microalbumin / creatinine urine ratio   Hyperlipidemia -continue medications, check lipids, decrease fatty foods, increase activity.  - Lipid panel  Hypogonadism male Hypogonadism- continue replacement therapy, check testosterone levels as needed.  - Testosterone  Asthma, mild intermittent, uncomplicated  inhaler PRN  Depression, remission  xanax, takes rarely  Gastroesophageal reflux disease with esophagitis Get on PPI, will refer to GI if not better  Erectile dysfunction, unspecified erectile dysfunction type meds PRN- sent in  Vitamin D deficiency - Vit D  25 hydroxy (rtn osteoporosis monitoring)  Medication management - Magnesium   History of basal cell carcinoma Follows Derm   Psoriasis Follows rheum, follow back up for back pain  Encounter for general adult medical examination with abnormal findings  Screening, anemia, deficiency, iron -     Iron,Total/Total Iron Binding Cap -     Vitamin B12  Screening PSA (prostate specific antigen) -     PSA  Discussed med's effects and SE's. Screening labs and tests as requested with regular follow-up as recommended.  Future Appointments  Date Time Provider [place]  05/08/2020 10:00 AM [name], PA-C [id number] None    HPI  His blood pressure has been controlled at home, he is off ACE due to cough, today their BP is BP: 112/74.  He does workout. He denies chest pain, shortness of breath, dizziness.  He had negative at home screening sleep study with dentist.  He was having chest pain with exertion in Aug, no accompaniments, no radiation,  well localized, he was put on PPI, told to stop alcohol. Has gotten better with tums/ppi, no longer have CP with running.   He has history of joint pain, has psoriasis and possible psoriatic arthritis, followed with Dr. Patrecia Pour, at this time he is only doing topical treatment and mobic PRN, no DMARD. He has been having bilateral hip pain at night, he is running, better with movement.    He does have dribbling and the stream has gotten slower.   BMI is Body mass index is 26.27 kg/m., he is working on diet and exercise. Wt Readings from Last 3 Encounters:  05/06/19 191 lb (86.6 kg)  02/02/19 190 lb (86.2 kg)  02/26/18 189 lb (85.7 kg)   He is on cholesterol medication, lipitor 1/2 pill 2 x a week and denies myalgias. His cholesterol is at goal. The cholesterol last visit was:   Lab Results  Component Value Date   CHOL 184 02/02/2019   HDL 45 02/02/2019   LDLCALC 111 (H) 02/02/2019   TRIG 162 (H) 02/02/2019   CHOLHDL 4.1 02/02/2019    Last A1C in the office was:  Lab Results  Component Value Date   HGBA1C 5.4 02/09/2015   He has a history of testosterone deficiency and is on testosterone replacement. He states that the testosterone helps with his energy, libido, muscle mass. Lab Results  Component Value Date   TESTOSTERONE 291 02/02/2019   Lab Results  Component Value Date   PSA 1.1 02/14/2016   PSA 1.58 02/09/2015   PSA 1.22 02/03/2014   Patient is on Vitamin D supplement.   Lab Results  Component Value Date  VD25OH 39 02/02/2019      Current Medications:  Current Outpatient Medications on File Prior to Visit  Medication Sig Dispense Refill  . albuterol (VENTOLIN HFA) 108 (90 BASE) MCG/ACT inhaler Inhale 2 puffs into the lungs every 4 (four) hours as needed for wheezing or shortness of breath. 1 Inhaler 3  . ALPRAZolam (XANAX) 0.5 MG tablet Take 1 tablet (0.5 mg total) by mouth 3 (three) times daily as needed for sleep or anxiety. 10 tablet 0  . aspirin 81 MG  chewable tablet Chew by mouth daily.    Marland Kitchen atorvastatin (LIPITOR) 10 MG tablet Take 1 tablet Daily for Cholesterol - keep sept appointment 90 tablet 0  . diclofenac sodium (VOLTAREN) 1 % GEL 3 g to 3 large joints three times daily 3 Tube 3  . famotidine (PEPCID) 40 MG tablet TAKE 1 TABLET AT BEDTIME FOR ACID REFLUX AND INDIGESTION. 30 tablet 0  . FLECTOR 1.3 % PTCH APPLY ONE PATCH TOPICALLY TWICE A DAY AS DIRECTED. 180 patch 1  . loratadine (CLARITIN) 10 MG tablet Take 10 mg by mouth daily.    . meloxicam (MOBIC) 15 MG tablet Take 1/2 to 1 tablet daily with food for pain & inflammation Limit to 5 days /week to prevent Kidney Damage 30 tablet 0  . olmesartan (BENICAR) 40 MG tablet Take 1 tablet Daily for BP 90 tablet 0  . Omega-3 Fatty Acids (FISH OIL PO) Take by mouth daily.    Marland Kitchen omeprazole (PRILOSEC) 20 MG capsule Take 20 mg by mouth daily as needed.    . tadalafil (CIALIS) 20 MG tablet Take 1 tablet (20 mg total) by mouth daily as needed for erectile dysfunction. 10 tablet 2  . Testosterone (ANDROGEL PUMP) 20.25 MG/ACT (1.62%) GEL APPLY 2 PUMPS TO EACH ARM DAILY. 150 g 3  . triamcinolone cream (KENALOG) 0.1 % Apply 1 application topically 2 (two) times daily. 45 g 1   No current facility-administered medications on file prior to visit.    Health Maintenance:  Immunization History  Administered Date(s) Administered  . Influenza Inj Mdck Quad Pf 04/24/2017  . Influenza Inj Mdck Quad With Preservative 02/26/2018  . Influenza Split 03/28/2013, 03/30/2014  . Influenza, Seasonal, Injecte, Preservative Fre 05/04/2015  . Tdap 02/20/2017   Tetanus: 2018 Pneumovax: Prevnar 13:  Flu vaccine: 2020 at allergist Zostavax: DEXA: Colonoscopy: 02/23/2015 due 10 years EGD:  Medical History:  Patient Active Problem List   Diagnosis Date Noted  . Medication management 05/04/2019  . Vitamin D deficiency 05/04/2019  . History of basal cell carcinoma 02/09/2015  . Psoriasis 02/09/2015  .  Hypogonadism male 08/18/2014  . 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 Hypertension   . Hyperlipidemia   . Allergy   . Asthma   . Depression   . GERD (gastroesophageal reflux disease)   . ED (erectile dysfunction)   . Plantar fasciitis 01/06/2012   Allergies Allergies  Allergen Reactions  . Enalapril Cough    SURGICAL HISTORY He  has a past surgical history that includes Vasectomy (2013); orthopedic surgery; Hernia repair; LASIK; and scalp biopsy. FAMILY HISTORY His family history includes Asthma in his father; Heart disease in his father and mother; Kidney disease in his father; Rheum arthritis in his father; Spondylitis in his brother; Uterine cancer in his mother. SOCIAL HISTORY He  reports that he has quit smoking. His smoking use included cigars. He has never used smokeless tobacco. He reports current alcohol use. He reports that he does not use drugs.  Review of Systems  Constitutional: Negative.   HENT: Negative.   Eyes: Negative.   Respiratory: Negative.   Cardiovascular: Negative.   Gastrointestinal: Negative for abdominal pain, blood in stool, constipation, diarrhea, heartburn, melena, nausea and vomiting.  Genitourinary: Negative.        Dribbling  Musculoskeletal: Positive for back pain. Negative for falls, joint pain, myalgias and neck pain.  Skin: Negative.   Neurological: Negative for dizziness, tingling (bilateral hands at night), tremors, sensory change, speech change, focal weakness, seizures and loss of consciousness.  Psychiatric/Behavioral: Negative.     Physical Exam: Estimated body mass index is 26.27 kg/m as calculated from the following:   Height as of this encounter: 5' 11.5" (1.816 m).   Weight as of this encounter: 191 lb (86.6 kg). BP 112/74   Pulse 83   Temp 97.7 F (36.5 C)   Ht 5' 11.5" (1.816 m)   Wt 191 lb (86.6 kg)   SpO2 97%   BMI 26.27 kg/m  General Appearance: Well nourished, in no apparent distress. Eyes: PERRLA, EOMs, conjunctiva no  swelling or erythema, normal fundi and vessels. Sinuses: No Frontal/maxillary tenderness ENT/Mouth: Ext aud canals clear, normal light reflex with TMs without erythema, bulging. Good dentition. No erythema, swelling, or exudate on post pharynx. Tonsils not swollen or erythematous. Hearing normal.  Neck: Supple, thyroid normal. No bruits Respiratory: Respiratory effort normal, BS equal bilaterally without rales, rhonchi, wheezing or stridor. Cardio: RRR without murmurs, rubs or gallops. Brisk peripheral pulses without edema.  Chest: symmetric, with normal excursions and percussion. Abdomen: Soft, +BS. Non tender, no guarding, rebound, hernias, masses, or organomegaly.  Lymphatics: Non tender without lymphadenopathy.  Genitourinary: defer Musculoskeletal: Full ROM all peripheral extremities,5/5 strength, and normal gait. Skin: Warm, dry without rashes, lesions, ecchymosis.  Neuro: Cranial nerves intact, reflexes equal bilaterally. Normal muscle tone, no cerebellar symptoms. Sensation intact.  Psych: Awake and oriented X 3, normal affect, Insight and Judgment appropriate.   EKG: defer AORTA SCAN: defer  [name] 11:01 AM

## 2019-05-06 ENCOUNTER — Other Ambulatory Visit: Payer: Self-pay

## 2019-05-06 ENCOUNTER — Ambulatory Visit (INDEPENDENT_AMBULATORY_CARE_PROVIDER_SITE_OTHER): Payer: 59 | Admitting: Physician Assistant

## 2019-05-06 ENCOUNTER — Encounter: Payer: Self-pay | Admitting: Physician Assistant

## 2019-05-06 VITALS — BP 112/74 | HR 83 | Temp 97.7°F | Ht 71.5 in | Wt 191.0 lb

## 2019-05-06 DIAGNOSIS — Z79899 Other long term (current) drug therapy: Secondary | ICD-10-CM

## 2019-05-06 DIAGNOSIS — Z85828 Personal history of other malignant neoplasm of skin: Secondary | ICD-10-CM

## 2019-05-06 DIAGNOSIS — E559 Vitamin D deficiency, unspecified: Secondary | ICD-10-CM

## 2019-05-06 DIAGNOSIS — Z125 Encounter for screening for malignant neoplasm of prostate: Secondary | ICD-10-CM

## 2019-05-06 DIAGNOSIS — Z136 Encounter for screening for cardiovascular disorders: Secondary | ICD-10-CM

## 2019-05-06 DIAGNOSIS — T7840XS Allergy, unspecified, sequela: Secondary | ICD-10-CM

## 2019-05-06 DIAGNOSIS — I10 Essential (primary) hypertension: Secondary | ICD-10-CM | POA: Diagnosis not present

## 2019-05-06 DIAGNOSIS — Z130 Encounter for screening for diseases of the blood and blood-forming organs and certain disorders involving the immune mechanism: Secondary | ICD-10-CM

## 2019-05-06 DIAGNOSIS — Z0001 Encounter for general adult medical examination with abnormal findings: Secondary | ICD-10-CM

## 2019-05-06 DIAGNOSIS — E785 Hyperlipidemia, unspecified: Secondary | ICD-10-CM

## 2019-05-06 DIAGNOSIS — E291 Testicular hypofunction: Secondary | ICD-10-CM

## 2019-05-06 DIAGNOSIS — N529 Male erectile dysfunction, unspecified: Secondary | ICD-10-CM

## 2019-05-06 DIAGNOSIS — Z0000 Encounter for general adult medical examination without abnormal findings: Secondary | ICD-10-CM | POA: Diagnosis not present

## 2019-05-06 DIAGNOSIS — J45909 Unspecified asthma, uncomplicated: Secondary | ICD-10-CM

## 2019-05-06 DIAGNOSIS — F3342 Major depressive disorder, recurrent, in full remission: Secondary | ICD-10-CM

## 2019-05-06 DIAGNOSIS — L409 Psoriasis, unspecified: Secondary | ICD-10-CM

## 2019-05-06 DIAGNOSIS — K2100 Gastro-esophageal reflux disease with esophagitis, without bleeding: Secondary | ICD-10-CM

## 2019-05-06 MED ORDER — VARDENAFIL HCL 20 MG PO TABS: 20.0000 mg | ORAL_TABLET | ORAL | 1 refills | Status: DC | PRN

## 2019-05-06 NOTE — Patient Instructions (Signed)
 Silent reflux: Not all heartburn burns......  What is LPR? Laryngopharyngeal reflux (LPR) or silent reflux is a condition in which acid that is made in the stomach travels up the esophagus (swallowing tube) and gets to the throat. Not everyone with reflux has a lot of heartburn or indigestion. In fact, many people with LPR never have heartburn. This is why LPR is called SILENT REFLUX, and the terms "Silent reflux" and "LPR" are often used interchangeably. Because LPR is silent, it is sometimes difficult to diagnose.  How can you tell if you have LPR?  . Chronic hoarseness- Some people have hoarseness that comes and goes . throat clearing  . Cough . It can cause shortness of breath and cause asthma like symptoms. . a feeling of a lump in the throat  . difficulty swallowing . a problem with too much nose and throat drainage.  . Some people will feel their esophagus spasm which feels like their heart beating hard and fast, this will usually be after a meal, at rest, or lying down at night.    How do I treat this? Treatment for LPR should be individualized, and your doctor will suggest the best treatment for you. Generally there are several treatments for LPR: . changing habits and diet to reduce reflux,  . medications to reduce stomach acid, and  . surgery to prevent reflux. Most people with LPR need to modify how and when they eat, as well as take some medication, to get well. Sometimes, nonprescription liquid antacids, such as Maalox, Gelucil and Mylanta are recommended. When used, these antacids should be taken four times each day - one tablespoon one hour after each meal and before bedtime. Dietary and lifestyle changes alone are not often enough to control LPR - medications that reduce stomach acid are also usually needed. These must be prescribed by our doctor.   TIPS FOR REDUCING REFLUX AND LPR Control your LIFE-STYLE and your DIET! . If you use tobacco, QUIT.  . Smoking makes  you reflux. After every cigarette you have some LPR.  . Don't wear clothing that is too tight, especially around the waist (trousers, corsets, belts).  . Do not lie down just after eating...in fact, do not eat within three hours of bedtime.  . You should be on a low-fat diet.  . Limit your intake of red meat.  . Limit your intake of butter.  . Avoid fried foods.  . Avoid chocolate  . Avoid cheese.  . Avoid eggs. . Specifically avoid caffeine (especially coffee and tea), soda pop (especially cola) and mints.  Avoid alcoholic beverages, particularly in the evening.   Ask insurance and pharmacy about shingrix - new vaccine   Can go to https://www.cdc.gov/vaccines/vpd/shingles/public/shingrix/index.html for more information  Shingrix Vaccination  Two vaccines are licensed and recommended to prevent shingles in the [place] Zoster vaccine live (ZVL, Zostavax) has been in use since 2006. Recombinant zoster vaccine (RZV, Shingrix), has been in use since 2017 and is recommended by AC[employer] as the preferred shingles vaccine.  What Everyone Should Know about Shingles Vaccine (Shingrix) One of the Recommended Vaccines by Disease Shingles vaccination is the only way to protect against shingles and postherpetic neuralgia (PHN), the most common complication from shingles. CDC recommends that healthy adults 50 years and older get two doses of the shingles vaccine called Shingrix (recombinant zoster vaccine), separated by 2 to 6 months, to prevent shingles and the complications from the disease. Your doctor or pharmacist can give you Shingrix as   a shot in your upper arm. Shingrix provides strong protection against shingles and PHN. Two doses of Shingrix is more than 90% effective at preventing shingles and PHN. Protection stays above 85% for at least the first four years after you get vaccinated. Shingrix is the preferred vaccine, over Zostavax (zoster vaccine live), a shingles vaccine in use since 2006.  Zostavax may still be used to prevent shingles in healthy adults 60 years and older. For example, you could use Zostavax if a person is allergic to Shingrix, prefers Zostavax, or requests immediate vaccination and Shingrix is unavailable. Who Should Get Shingrix? Healthy adults 50 years and older should get two doses of Shingrix, separated by 2 to 6 months. You should get Shingrix even if in the past you . had shingles  . received Zostavax  . are not sure if you had chickenpox There is no maximum age for getting Shingrix. If you had shingles in the past, you can get Shingrix to help prevent future occurrences of the disease. There is no specific length of time that you need to wait after having shingles before you can receive Shingrix, but generally you should make sure the shingles rash has gone away before getting vaccinated. You can get Shingrix whether or not you remember having had chickenpox in the past. Studies show that more than 99% of Americans 40 years and older have had chickenpox, even if they don't remember having the disease. Chickenpox and shingles are related because they are caused by the same virus (varicella zoster virus). After a person recovers from chickenpox, the virus stays dormant (inactive) in the body. It can reactivate years later and cause shingles. If you had Zostavax in the recent past, you should wait at least eight weeks before getting Shingrix. Talk to your healthcare provider to determine the best time to get Shingrix. Shingrix is available in doctor's offices and pharmacies. To find doctor's offices or pharmacies near you that offer the vaccine, visit HealthMap Vaccine FinderExternal. If you have questions about Shingrix, talk with your healthcare provider. Vaccine for Those 50 Years and Older  Shingrix reduces the risk of shingles and PHN by more than 90% in people 50 and older. CDC recommends the vaccine for healthy adults 50 and older.  Who Should Not Get  Shingrix? You should not get Shingrix if you: . have ever had a severe allergic reaction to any component of the vaccine or after a dose of Shingrix  . tested negative for immunity to varicella zoster virus. If you test negative, you should get chickenpox vaccine.  . currently have shingles  . currently are pregnant or breastfeeding. Women who are pregnant or breastfeeding should wait to get Shingrix.  . receive specific antiviral drugs (acyclovir, famciclovir, or valacyclovir) 24 hours before vaccination (avoid use of these antiviral drugs for 14 days after vaccination)- zoster vaccine live only If you have a minor acute (starts suddenly) illness, such as a cold, you may get Shingrix. But if you have a moderate or severe acute illness, you should usually wait until you recover before getting the vaccine. This includes anyone with a temperature of 101.3F or higher. The side effects of the Shingrix are temporary, and usually last 2 to 3 days. While you may experience pain for a few days after getting Shingrix, the pain will be less severe than having shingles and the complications from the disease. How Well Does Shingrix Work? Two doses of Shingrix provides strong protection against shingles and postherpetic neuralgia (PHN), the   most common complication of shingles. . In adults 50 to 56 years old who got two doses, Shingrix was 97% effective in preventing shingles; among adults 70 years and older, Shingrix was 91% effective.  . In adults 50 to 56 years old who got two doses, Shingrix was 91% effective in preventing PHN; among adults 70 years and older, Shingrix was 89% effective. Shingrix protection remained high (more than 85%) in people 70 years and older throughout the four years following vaccination. Since your risk of shingles and PHN increases as you get older, it is important to have strong protection against shingles in your older years. Top of Page  What Are the Possible Side Effects of  Shingrix? Studies show that Shingrix is safe. The vaccine helps your body create a strong defense against shingles. As a result, you are likely to have temporary side effects from getting the shots. The side effects may affect your ability to do normal daily activities for 2 to 3 days. Most people got a sore arm with mild or moderate pain after getting Shingrix, and some also had redness and swelling where they got the shot. Some people felt tired, had muscle pain, a headache, shivering, fever, stomach pain, or nausea. About 1 out of 6 people who got Shingrix experienced side effects that prevented them from doing regular activities. Symptoms went away on their own in about 2 to 3 days. Side effects were more common in younger people. You might have a reaction to the first or second dose of Shingrix, or both doses. If you experience side effects, you may choose to take over-the-counter pain medicine such as ibuprofen or acetaminophen. If you experience side effects from Shingrix, you should report them to the Vaccine Adverse Event Reporting System (VAERS). Your doctor might file this report, or you can do it yourself through the VAERS websiteExternal, or by calling 1-800-822-7967. If you have any questions about side effects from Shingrix, talk with your doctor. The shingles vaccine does not contain thimerosal (a preservative containing mercury). Top of Page  When Should I See a Doctor Because of the Side Effects I Experience From Shingrix? In clinical trials, Shingrix was not associated with serious adverse events. In fact, serious side effects from vaccines are extremely rare. For example, for every 1 million doses of a vaccine given, only one or two people may have a severe allergic reaction. Signs of an allergic reaction happen within minutes or hours after vaccination and include hives, swelling of the face and throat, difficulty breathing, a fast heartbeat, dizziness, or weakness. If you experience  these or any other life-threatening symptoms, see a doctor right away. Shingrix causes a strong response in your immune system, so it may produce short-term side effects more intense than you are used to from other vaccines. These side effects can be uncomfortable, but they are expected and usually go away on their own in 2 or 3 days. Top of Page  How Can I Pay For Shingrix? There are several ways shingles vaccine may be paid for: Medicare . Medicare Part D plans cover the shingles vaccine, but there may be a cost to you depending on your plan. There may be a copay for the vaccine, or you may need to pay in full then get reimbursed for a certain amount.  . Medicare Part B does not cover the shingles vaccine. Medicaid . Medicaid may or may not cover the vaccine. Contact your insurer to find out. Private health insurance . Many private   health insurance plans will cover the vaccine, but there may be a cost to you depending on your plan. Contact your insurer to find out. Vaccine assistance programs . Some pharmaceutical companies provide vaccines to eligible adults who cannot afford them. You may want to check with the vaccine manufacturer, [employer], about Shingrix. If you do not currently have health insurance, learn more about affordable health coverage optionsExternal. To find doctor's offices or pharmacies near you that offer the vaccine, visit HealthMap Vaccine FinderExternal.  

## 2019-05-07 LAB — URINALYSIS, ROUTINE W REFLEX MICROSCOPIC
Bilirubin Urine: NEGATIVE
Glucose, UA: NEGATIVE
Hgb urine dipstick: NEGATIVE
Ketones, ur: NEGATIVE
Leukocytes,Ua: NEGATIVE
Nitrite: NEGATIVE
Protein, ur: NEGATIVE
Specific Gravity, Urine: 1.021 (ref 1.001–1.03)
pH: 8 (ref 5.0–8.0)

## 2019-05-07 LAB — CBC WITH DIFFERENTIAL/PLATELET
Absolute Monocytes: 460 cells/uL (ref 200–950)
Basophils Absolute: 63 cells/uL (ref 0–200)
Basophils Relative: 1 %
Eosinophils Absolute: 189 cells/uL (ref 15–500)
Eosinophils Relative: 3 %
HCT: 44.6 % (ref 38.5–50.0)
Hemoglobin: 15 g/dL (ref 13.2–17.1)
Lymphs Abs: 1292 cells/uL (ref 850–3900)
MCH: 31.4 pg (ref 27.0–33.0)
MCHC: 33.6 g/dL (ref 32.0–36.0)
MCV: 93.5 fL (ref 80.0–100.0)
MPV: 11.5 fL (ref 7.5–12.5)
Monocytes Relative: 7.3 %
Neutro Abs: 4297 cells/uL (ref 1500–7800)
Neutrophils Relative %: 68.2 %
Platelets: 222 10*3/uL (ref 140–400)
RBC: 4.77 10*6/uL (ref 4.20–5.80)
RDW: 12.2 % (ref 11.0–15.0)
Total Lymphocyte: 20.5 %
WBC: 6.3 10*3/uL (ref 3.8–10.8)

## 2019-05-07 LAB — COMPLETE METABOLIC PANEL WITH GFR
AG Ratio: 1.9 (calc) (ref 1.0–2.5)
ALT: 50 U/L — ABNORMAL HIGH (ref 9–46)
AST: 37 U/L — ABNORMAL HIGH (ref 10–35)
Albumin: 4.7 g/dL (ref 3.6–5.1)
Alkaline phosphatase (APISO): 36 U/L (ref 35–144)
BUN: 14 mg/dL (ref 7–25)
CO2: 27 mmol/L (ref 20–32)
Calcium: 10 mg/dL (ref 8.6–10.3)
Chloride: 105 mmol/L (ref 98–110)
Creat: 0.96 mg/dL (ref 0.70–1.33)
GFR, Est African American: 102 mL/min/{1.73_m2} (ref >=60)
GFR, Est Non African American: 88 mL/min/{1.73_m2} (ref >=60)
Globulin: 2.5 g/dL (calc) (ref 1.9–3.7)
Glucose, Bld: 103 mg/dL — ABNORMAL HIGH (ref 65–99)
Potassium: 4.5 mmol/L (ref 3.5–5.3)
Sodium: 141 mmol/L (ref 135–146)
Total Bilirubin: 0.9 mg/dL (ref 0.2–1.2)
Total Protein: 7.2 g/dL (ref 6.1–8.1)

## 2019-05-07 LAB — MICROALBUMIN / CREATININE URINE RATIO
Creatinine, Urine: 175 mg/dL (ref 20–320)
Microalb Creat Ratio: 4 mcg/mg creat (ref <30)
Microalb, Ur: 0.7 mg/dL

## 2019-05-07 LAB — LIPID PANEL
Cholesterol: 140 mg/dL (ref <200)
HDL: 50 mg/dL (ref >=40)
LDL Cholesterol (Calc): 71 mg/dL (calc)
Non-HDL Cholesterol (Calc): 90 mg/dL (calc) (ref <130)
Total CHOL/HDL Ratio: 2.8 (calc) (ref <5.0)
Triglycerides: 103 mg/dL (ref <150)

## 2019-05-07 LAB — TESTOSTERONE: Testosterone: 663 ng/dL (ref 250–827)

## 2019-05-07 LAB — VITAMIN D 25 HYDROXY (VIT D DEFICIENCY, FRACTURES): Vit D, 25-Hydroxy: 33 ng/mL (ref 30–100)

## 2019-05-07 LAB — IRON, TOTAL/TOTAL IRON BINDING CAP
%SAT: 43 % (calc) (ref 20–48)
Iron: 126 ug/dL (ref 50–180)
TIBC: 293 mcg/dL (calc) (ref 250–425)

## 2019-05-07 LAB — PSA: PSA: 0.8 ng/mL (ref <=4.0)

## 2019-05-07 LAB — VITAMIN B12: Vitamin B-12: 1038 pg/mL (ref 200–1100)

## 2019-05-07 LAB — MAGNESIUM: Magnesium: 1.6 mg/dL (ref 1.5–2.5)

## 2019-05-09 ENCOUNTER — Other Ambulatory Visit: Payer: Self-pay | Admitting: Physician Assistant

## 2019-05-09 DIAGNOSIS — R7989 Other specified abnormal findings of blood chemistry: Secondary | ICD-10-CM

## 2019-05-16 ENCOUNTER — Other Ambulatory Visit: Payer: Self-pay | Admitting: Physician Assistant

## 2019-06-25 ENCOUNTER — Other Ambulatory Visit: Payer: Self-pay | Admitting: Physician Assistant

## 2019-06-25 MED ORDER — OMEPRAZOLE 40 MG PO CPDR: 40.0000 mg | DELAYED_RELEASE_CAPSULE | Freq: Every day | ORAL | 3 refills | Status: DC | PRN

## 2019-07-03 ENCOUNTER — Other Ambulatory Visit: Payer: Self-pay | Admitting: Physician Assistant

## 2019-07-03 DIAGNOSIS — K2100 Gastro-esophageal reflux disease with esophagitis, without bleeding: Secondary | ICD-10-CM

## 2019-07-13 ENCOUNTER — Ambulatory Visit: Payer: 59 | Attending: Internal Medicine

## 2019-07-13 DIAGNOSIS — Z20822 Contact with and (suspected) exposure to covid-19: Secondary | ICD-10-CM

## 2019-07-14 LAB — NOVEL CORONAVIRUS, NAA: SARS-CoV-2, NAA: NOT DETECTED

## 2019-07-25 ENCOUNTER — Ambulatory Visit: Payer: 59 | Attending: Internal Medicine

## 2019-07-25 DIAGNOSIS — Z20822 Contact with and (suspected) exposure to covid-19: Secondary | ICD-10-CM

## 2019-07-26 LAB — NOVEL CORONAVIRUS, NAA: SARS-CoV-2, NAA: NOT DETECTED

## 2019-08-12 DIAGNOSIS — J45909 Unspecified asthma, uncomplicated: Secondary | ICD-10-CM

## 2019-08-12 DIAGNOSIS — R05 Cough: Secondary | ICD-10-CM

## 2019-08-12 DIAGNOSIS — R059 Cough, unspecified: Secondary | ICD-10-CM

## 2019-08-16 ENCOUNTER — Ambulatory Visit
Admission: RE | Admit: 2019-08-16 | Discharge: 2019-08-16 | Disposition: A | Discharge status: Home / Self Care | Payer: 59 | Source: Ambulatory Visit | Attending: Physician Assistant | Admitting: Physician Assistant

## 2019-08-16 ENCOUNTER — Other Ambulatory Visit: Payer: Self-pay

## 2019-08-16 DIAGNOSIS — R05 Cough: Secondary | ICD-10-CM

## 2019-08-16 DIAGNOSIS — R059 Cough, unspecified: Secondary | ICD-10-CM

## 2019-08-16 DIAGNOSIS — J45909 Unspecified asthma, uncomplicated: Secondary | ICD-10-CM

## 2019-08-16 IMAGING — CR DG CHEST 2V
2 series · 2 of 2 positions shown · non-contrast
Comparison: None.

CLINICAL DATA: Chronic cough

EXAM:
CHEST - 2 VIEW

[w chest pa]
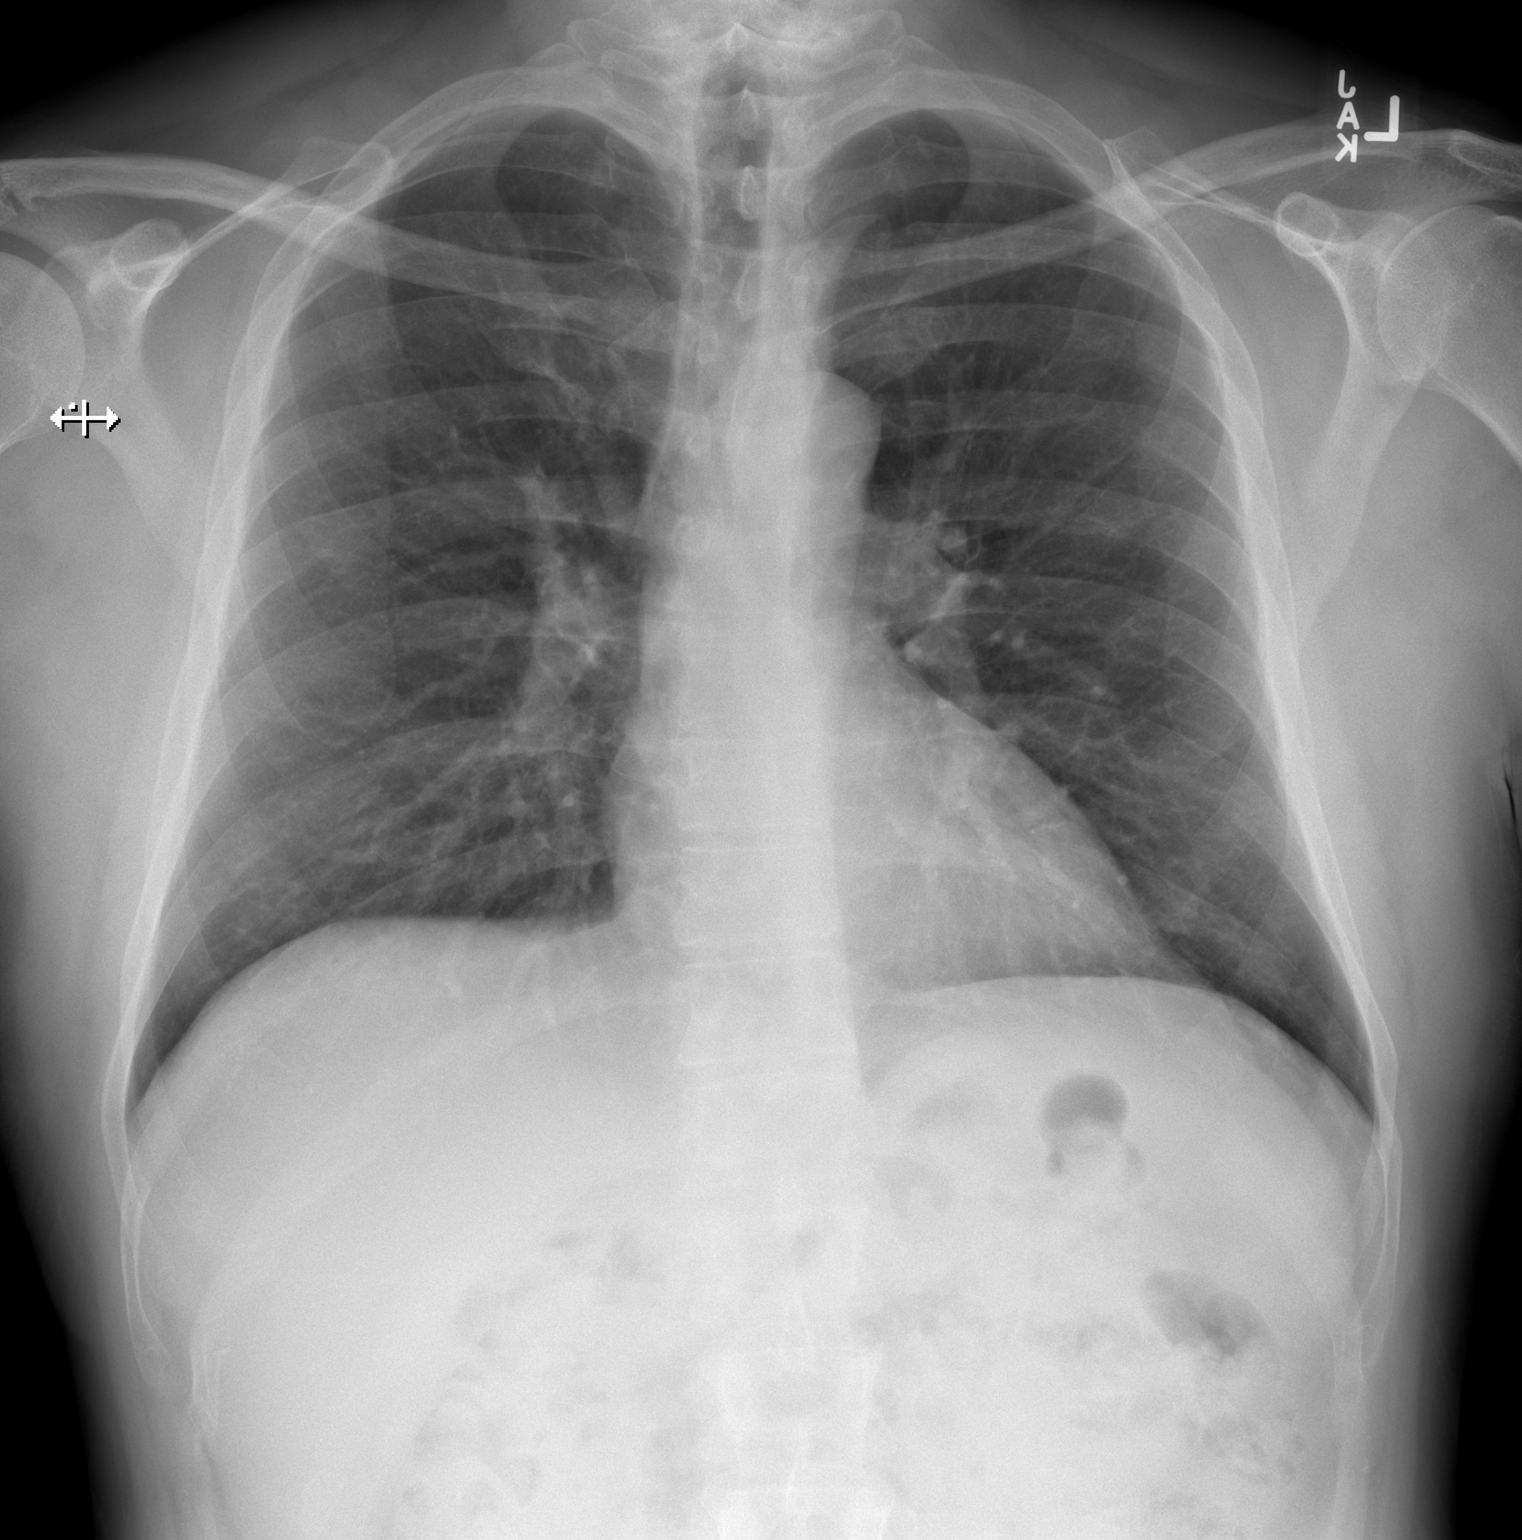

[w chest lat]
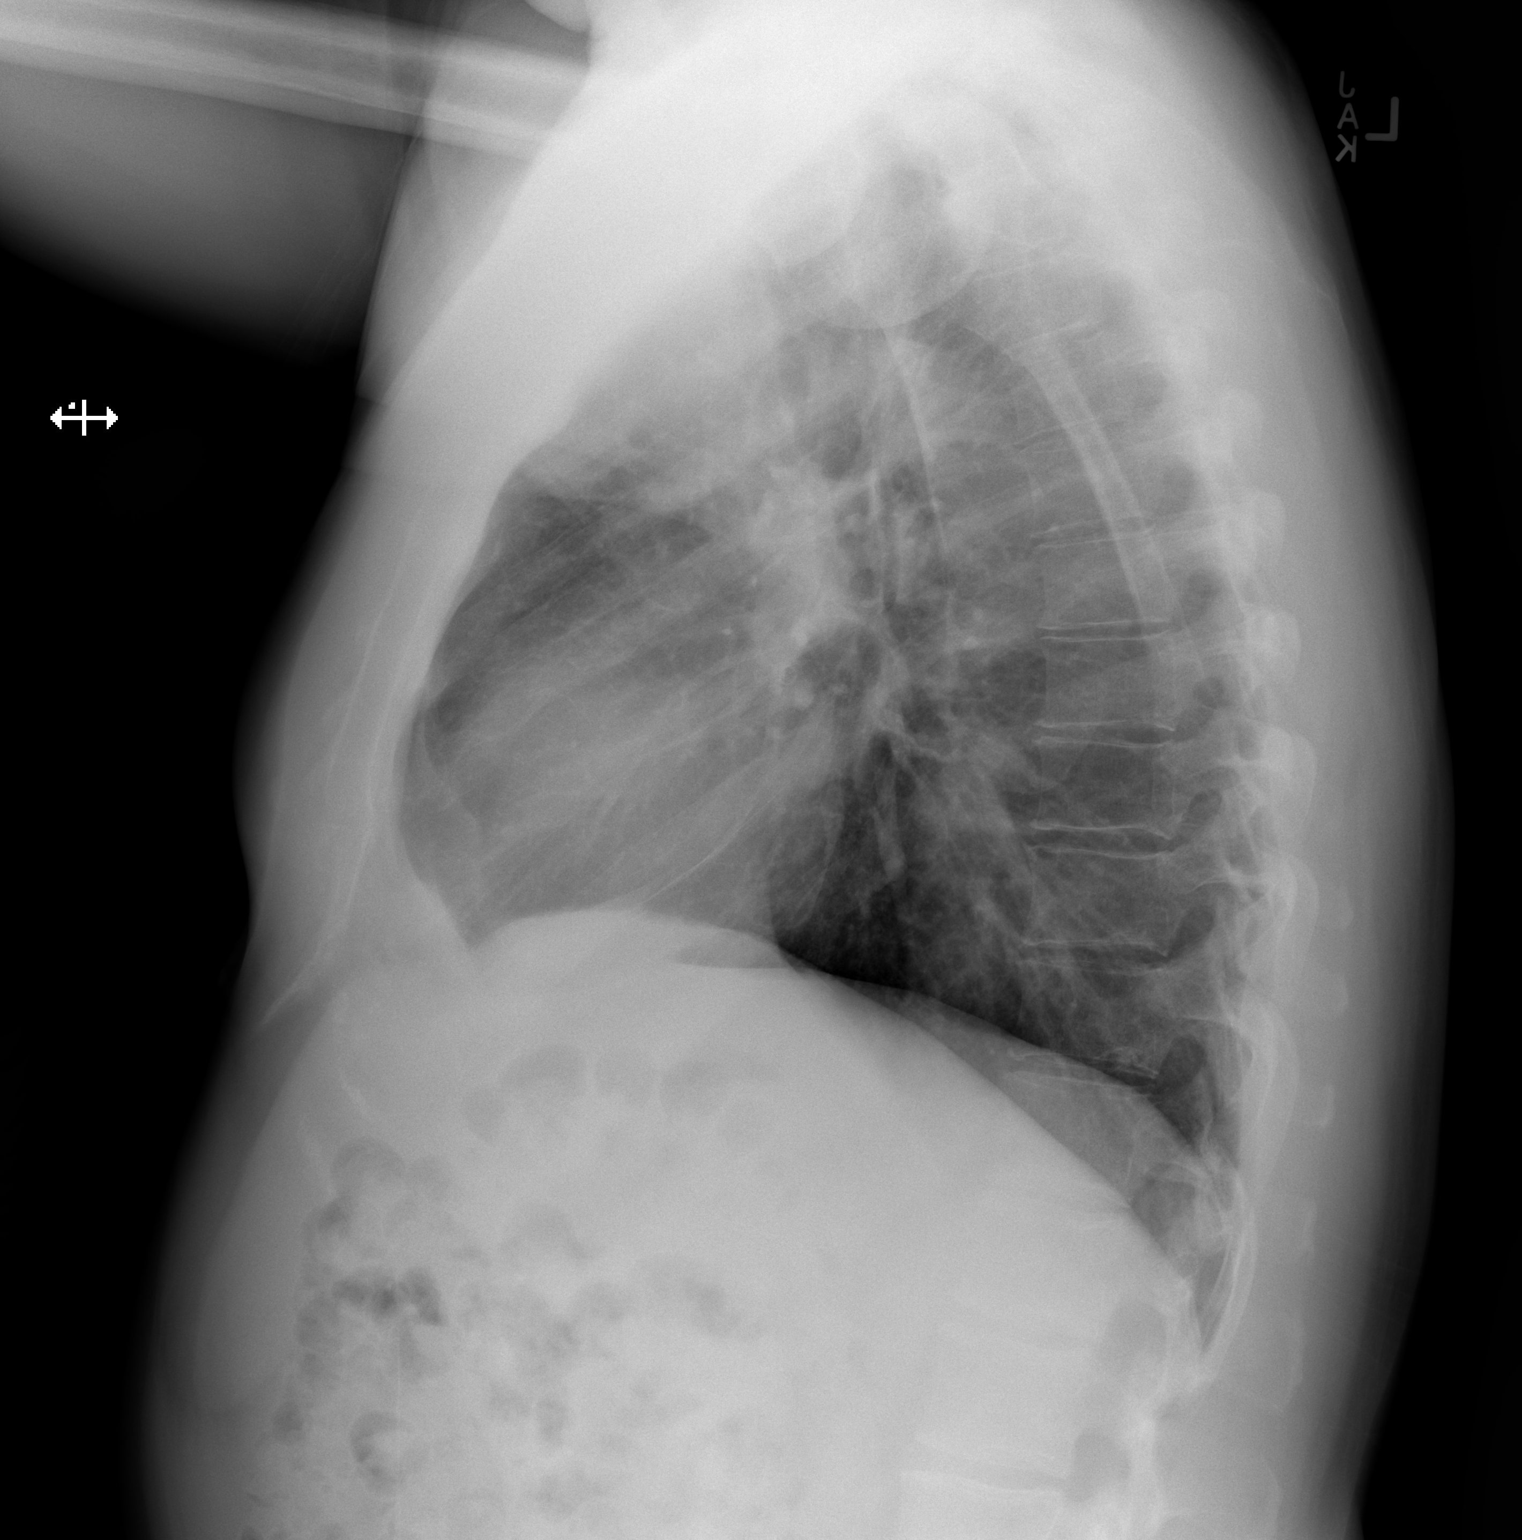

[2 of 2 positions shown; findings below may reference images not displayed]

FINDINGS: The heart size and mediastinal contours are within normal limits.
Both lungs are clear. The visualized skeletal structures are
unremarkable.
IMPRESSION: No active cardiopulmonary disease.

## 2019-09-14 ENCOUNTER — Ambulatory Visit: Payer: 59 | Attending: Internal Medicine

## 2019-09-14 DIAGNOSIS — Z20822 Contact with and (suspected) exposure to covid-19: Secondary | ICD-10-CM

## 2019-09-15 LAB — NOVEL CORONAVIRUS, NAA: SARS-CoV-2, NAA: NOT DETECTED

## 2019-10-15 ENCOUNTER — Other Ambulatory Visit: Payer: Self-pay | Admitting: Physician Assistant

## 2019-12-01 ENCOUNTER — Other Ambulatory Visit: Payer: Self-pay | Admitting: Physician Assistant

## 2019-12-02 ENCOUNTER — Other Ambulatory Visit: Payer: Self-pay | Admitting: Internal Medicine

## 2019-12-07 ENCOUNTER — Other Ambulatory Visit: Payer: Self-pay | Admitting: Physician Assistant

## 2019-12-16 ENCOUNTER — Ambulatory Visit
Admission: RE | Admit: 2019-12-16 | Discharge: 2019-12-16 | Disposition: A | Discharge status: Home / Self Care | Payer: 59 | Source: Ambulatory Visit | Attending: Physician Assistant | Admitting: Physician Assistant

## 2019-12-16 DIAGNOSIS — R7989 Other specified abnormal findings of blood chemistry: Secondary | ICD-10-CM

## 2019-12-16 IMAGING — US US ABDOMEN COMPLETE
2 series · 13 of 25 positions shown · non-contrast
Comparison: None.

CLINICAL DATA: 58-year-old male with abnormal LFTs.

EXAM:
ABDOMEN ULTRASOUND COMPLETE

[Series 1: us abdomen complete · 0.27mm/px · 12 of 86 slices shown (1 of 2)]
[im 1/86]
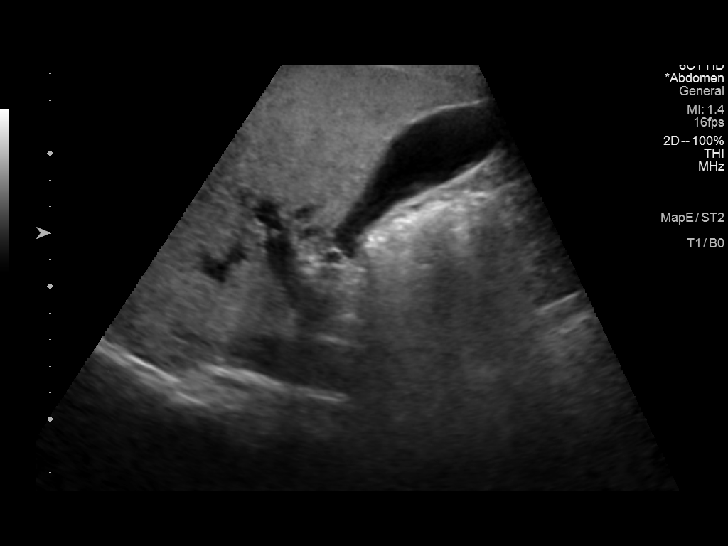
[im 8/86]
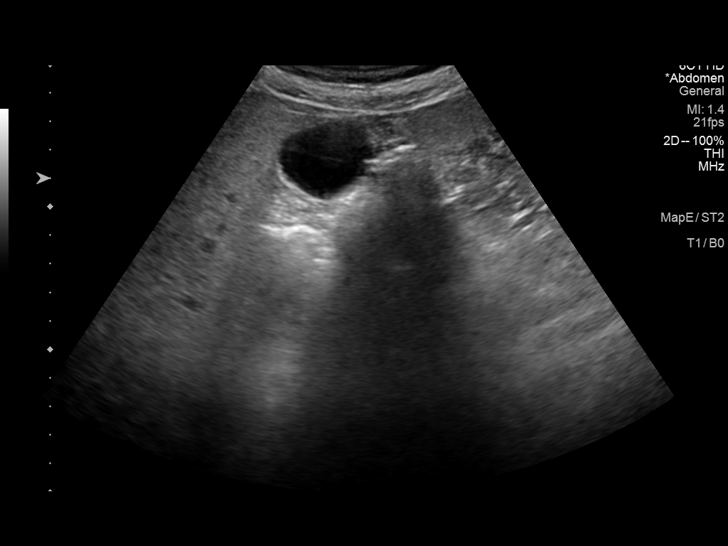
[im 15/86]
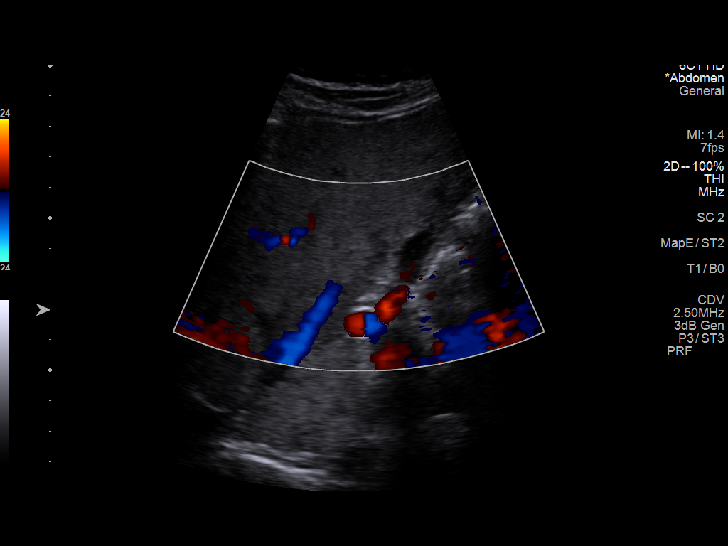
[im 23/86]
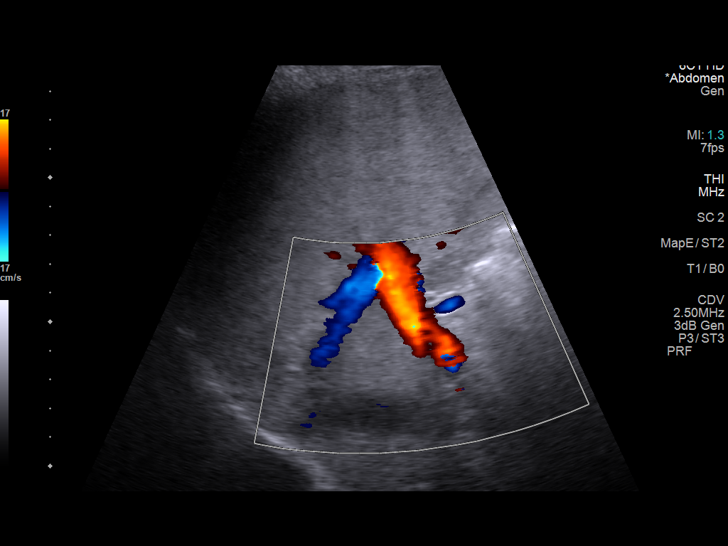
[im 30/86]
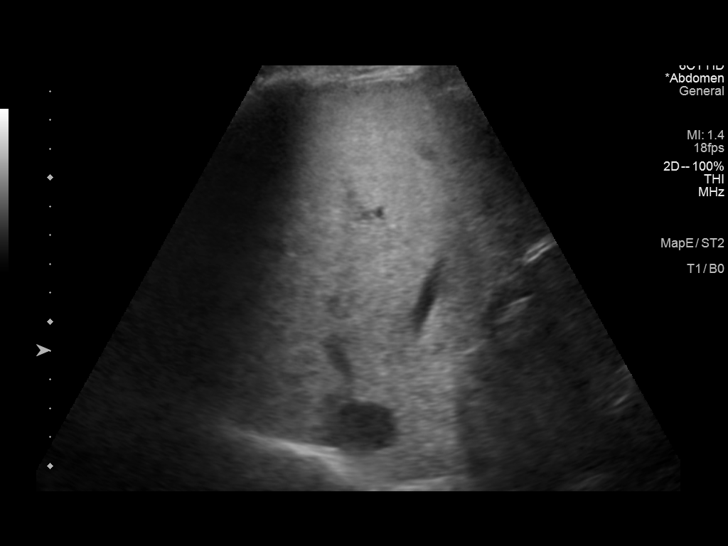
[im 37/86]
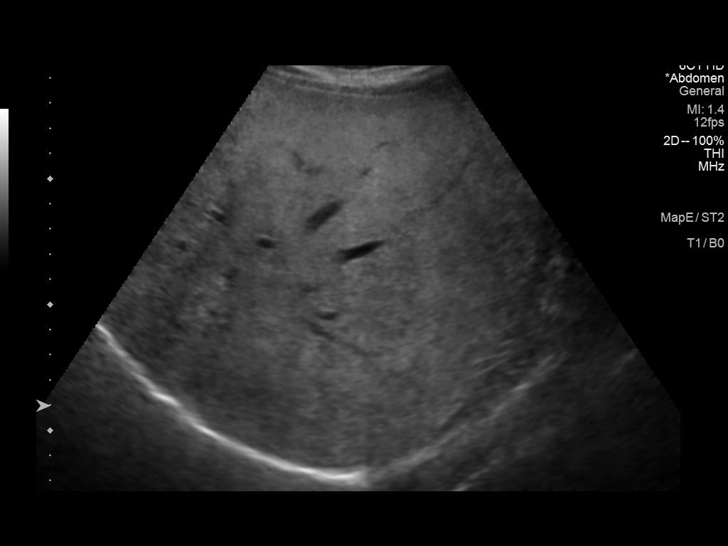
[im 45/86]
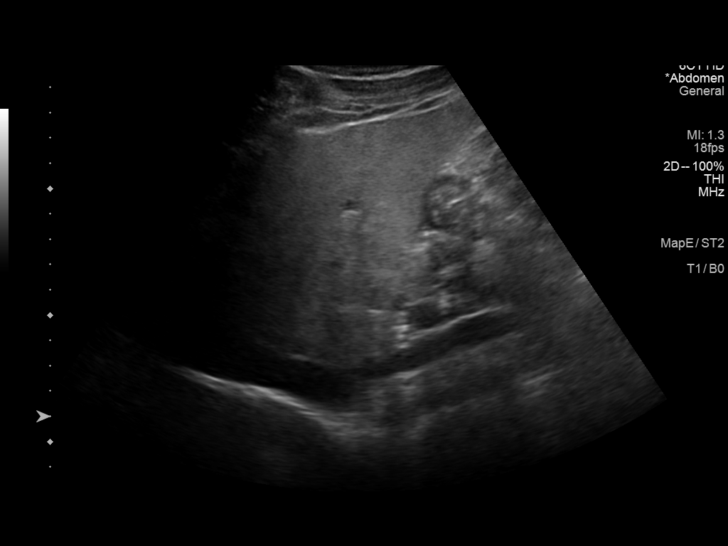
[im 52/86]
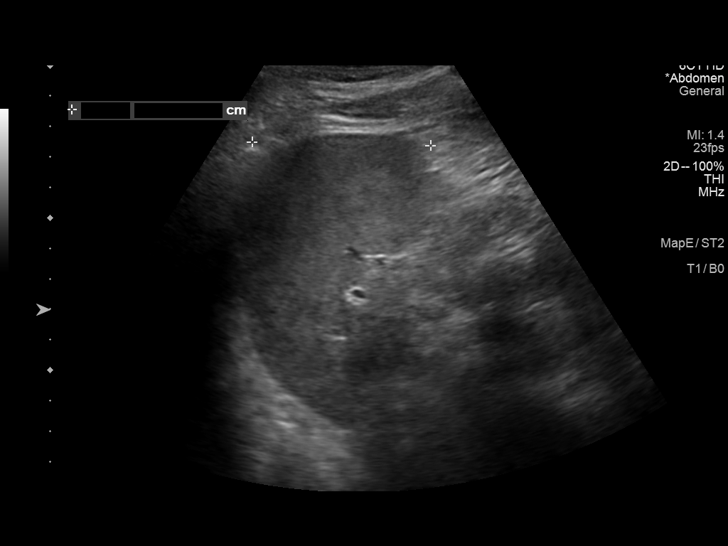
[im 60/86]
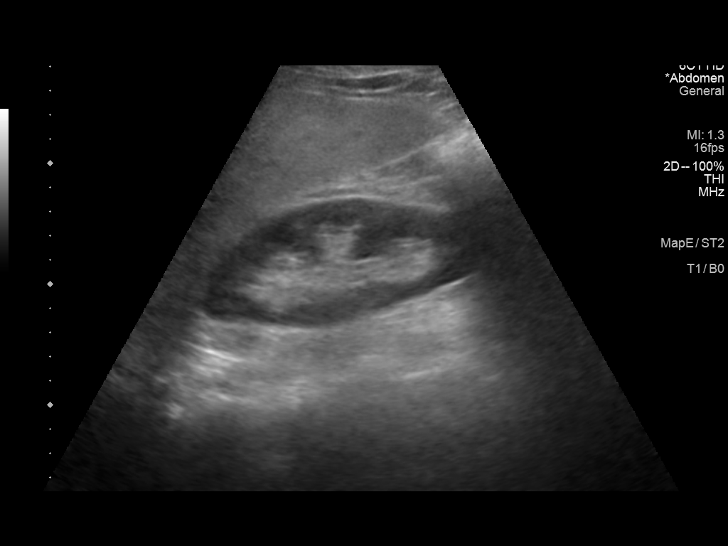
[im 67/86]
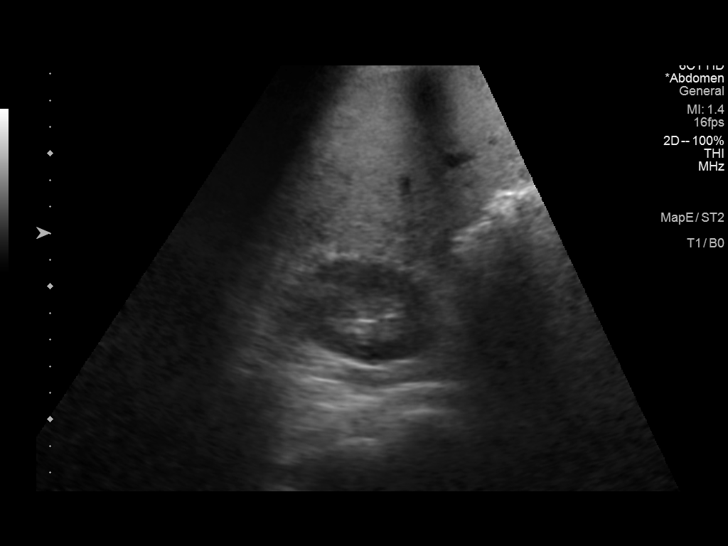
[im 74/86]
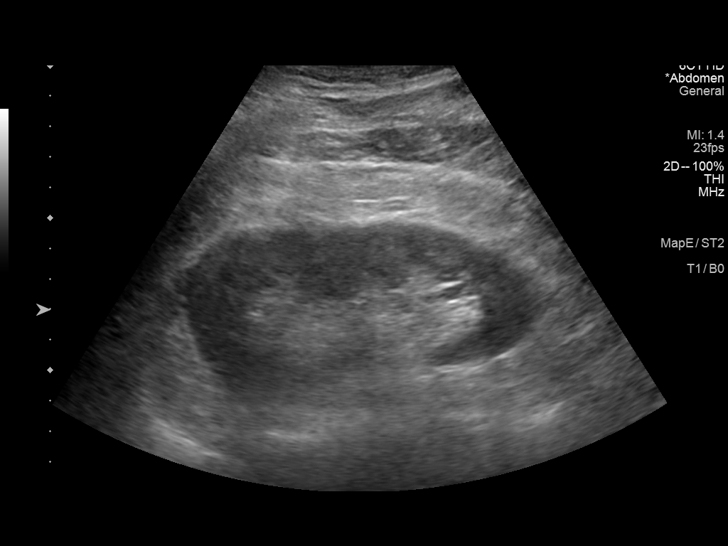
[im 82/86]
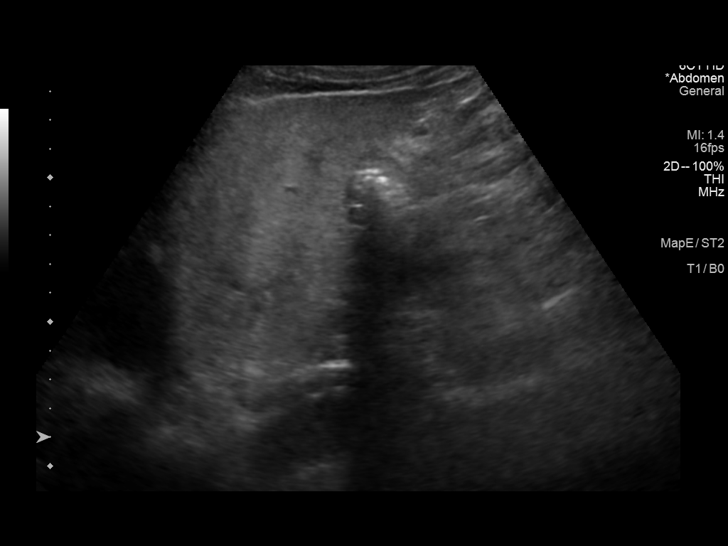

[Series 2001: us abdomen complete · 0.25mm/px · 1 of 2 slices shown (2 of 2)]
[im 1/2]
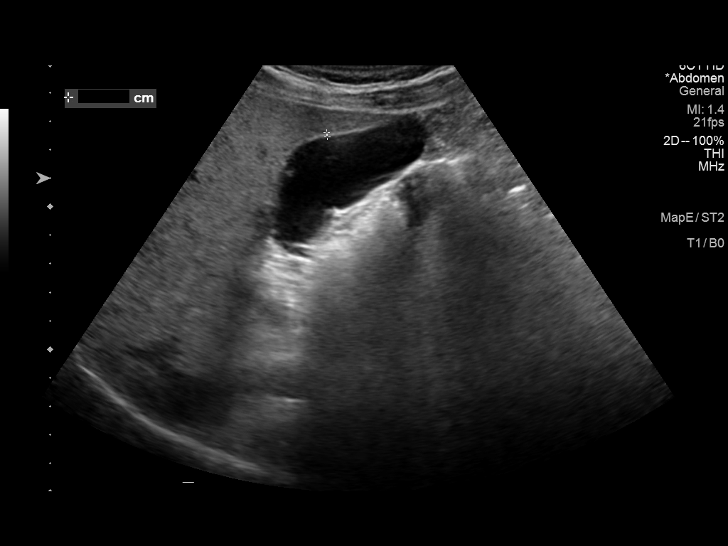

[13 of 25 positions shown; findings below may reference images not displayed]

FINDINGS: Gallbladder: No gallstones or wall thickening visualized. No
sonographic Murphy sign noted by sonographer.

Tiny 3-4 mm gallbladder polyp suspected (image 6), inconsequential.

Common bile duct: Diameter: 4 mm, normal.

Liver: Echogenic liver (image 27). No discrete liver lesion. No
intrahepatic biliary ductal dilatation. Portal vein is patent on
color Doppler imaging with normal direction of blood flow towards
the liver.

IVC: No abnormality visualized.

Pancreas: Visualized portion unremarkable.

Spleen: Size and appearance within normal limits.

Right Kidney: Length: 11.8 cm. Echogenicity within normal limits. No
mass or hydronephrosis visualized.

Left Kidney: Length: 11.7 cm. Echogenicity within normal limits. No
mass or hydronephrosis visualized.

Abdominal aorta: Incompletely visualized due to overlying bowel gas,
visualized portions within normal limits.

Other findings: None.
IMPRESSION: 1. Evidence of fatty liver disease.
2. Otherwise negative ultrasound appearance of the abdomen.

## 2020-01-06 ENCOUNTER — Ambulatory Visit: Payer: 59 | Admitting: Physician Assistant

## 2020-01-11 NOTE — Progress Notes (Signed)
Assessment and Plan:   Hyperlipidemia, unspecified hyperlipidemia type -     Lipid panel check lipids decrease fatty foods increase activity.   Essential hypertension -     CBC with Differential/Platelet -     COMPLETE METABOLIC PANEL WITH GFR -     TSH - continue medications, DASH diet, exercise and monitor at home. Call if greater than 130/80.   Hypogonadism male -     Testosterone 20.25 MG/ACT (1.62%) GEL; APPLY 2 PUMPS TO EACH ARM DAILY. -     tadalafil (CIALIS) 20 MG tablet; Take 1 tablet (20 mg total) by mouth daily as needed for erectile dysfunction.  Medication management -     CBC with Differential/Platelet -     COMPLETE METABOLIC PANEL WITH GFR -     TSH  Vitamin D deficiency -     VITAMIN D 25 Hydroxy (Vit-D Deficiency, Fractures)  Cough Recent COVID infection after vaccination, no CP, no leg swelling, no palpitations.  -     DG Chest 2 View; Future -     Budeson-Glycopyrrol-Formoterol (BREZTRI AEROSPHERE) 160-9-4.8 MCG/ACT AERO; Inhale 1 Pump into the lungs in the morning and at bedtime. ? Asthmatic cough- continue medications- will do trial of breztri inhaler, if it helps can do breo once daily for ashtma Continue follow up allergy doctor, may benefit from ENT Will get CXR since recent COVID infection and some worsening of symptoms with decreased BS LLL  Anemia, unspecified type -     Magnesium -     Vitamin B12 -     Iron,Total/Total Iron Binding Cap     Continue diet and meds as discussed. Further disposition pending results of labs. Future Appointments  Date Time Provider [place]  05/18/2020  9:00 AM [name], PA-C [facility] None    HPI 59 y.o. male  presents for 3 month follow up with hypertension, hyperlipidemia, prediabetes and vitamin D.   His blood pressure has NOT been monitored at home, today their BP is BP: 120/80    He does workout. He denies shortness of breath, dizziness.  He has been running, no chest pain with it,  states with taking tums before running that has resolved. He did have COVID July 4th weekend, he was vaccinated. He is still having cough/clearing throat, has had history of this but it is worse. He some increase SOB with exercise.   He has thick mucus that he coughs up in the morning. He has been taking pepcid, he has been doing dymysta and saline spray.  He is going to see an allergist today, gets allergy shots.  He had normal CXR 08/16/2019   Dad with valve replacement, mom with chest pain later in life but no family history of early CAD. He has HTN, cholesterol but otherwise low risk.    He is on cholesterol medication, lipitor 10mg  every day, has been back on x 3 weeks, and denies myalgias. His cholesterol is at goal. The cholesterol last visit was:  Lab Results  Component Value Date   CHOL 140 05/06/2019   HDL 50 05/06/2019   LDLCALC 71 05/06/2019   TRIG 103 05/06/2019   CHOLHDL 2.8 05/06/2019   Last A1C in the office was:  Lab Results  Component Value Date   HGBA1C 5.4 02/09/2015  Patient is on Vitamin D supplement.   Lab Results  Component Value Date   VD25OH 33 05/06/2019    He has a history of testosterone deficiency and is on testosterone replacement,  testim 2 tubes a day but states that it is not helping energy, libido and would like to try another gel..  Lab Results  Component Value Date   TESTOSTERONE 663 05/06/2019   BMI is Body mass index is 24.76 kg/m., he is working on diet and exercise. Wt Readings from Last 3 Encounters:  01/13/20 180 lb (81.6 kg)  05/06/19 191 lb (86.6 kg)  02/02/19 190 lb (86.2 kg)     Current Medications:  Current Outpatient Medications on File Prior to Visit  Medication Sig Dispense Refill  . albuterol (VENTOLIN HFA) 108 (90 BASE) MCG/ACT inhaler Inhale 2 puffs into the lungs every 4 (four) hours as needed for wheezing or shortness of breath. 1 Inhaler 3  . aspirin 81 MG chewable tablet Chew by mouth daily.    Marland Kitchen atorvastatin  (LIPITOR) 10 MG tablet TAKE 1 TABLET ONCE DAILY FOR CHOLESTEROL. 90 tablet 0  . diclofenac sodium (VOLTAREN) 1 % GEL 3 g to 3 large joints three times daily 3 Tube 3  . famotidine (PEPCID) 40 MG tablet Take 1 tablet at Bedtime for Indigestion & Acid Reflux 90 tablet 3  . FLECTOR 1.3 % PTCH APPLY ONE PATCH TOPICALLY TWICE A DAY AS DIRECTED. 180 patch 1  . loratadine (CLARITIN) 10 MG tablet Take 10 mg by mouth daily.    Marland Kitchen olmesartan (BENICAR) 40 MG tablet TAKE 1/2 TO 1 TABLET DAILY AS DIRECTED FOR BP GOAL <130/80. 90 tablet 0  . Omega-3 Fatty Acids (FISH OIL PO) Take by mouth daily.    Marland Kitchen omeprazole (PRILOSEC) 40 MG capsule Take 1 capsule (40 mg total) by mouth daily as needed. 30 capsule 3  . triamcinolone cream (KENALOG) 0.1 % Apply 1 application topically 2 (two) times daily. 45 g 1   No current facility-administered medications on file prior to visit.   Medical History:  Past Medical History:  Diagnosis Date  . Allergy   . Anxiety   . Asthma   . Basal cell carcinoma   . Depression   . ED (erectile dysfunction)   . GERD (gastroesophageal reflux disease)   . Hyperlipidemia   . Hypertension   . Hypogonadism in male   . Osteoarthritis   . Vitamin D deficiency    Allergies:  Allergies  Allergen Reactions  . Enalapril Cough     Review of Systems:  Review of Systems  Constitutional: Negative.   HENT: Negative.   Respiratory: Positive for cough, sputum production and shortness of breath. Negative for hemoptysis and wheezing.   Cardiovascular: Negative for chest pain, palpitations, orthopnea, claudication, leg swelling and PND.  Gastrointestinal: Positive for heartburn (depending on what he eats). Negative for abdominal pain, blood in stool, constipation, diarrhea, melena, nausea and vomiting.  Genitourinary: Negative.   Musculoskeletal: Positive for joint pain. Negative for back pain, falls, myalgias and neck pain.  Skin: Negative for itching and rash.  Neurological: Negative.    Psychiatric/Behavioral: Negative.     Family history- Review and unchanged Social history- Review and unchanged Physical Exam: BP 120/80   Pulse 75   Temp (!) 97.5 F (36.4 C)   Ht 5' 11.5" (1.816 m)   Wt 180 lb (81.6 kg)   SpO2 96%   BMI 24.76 kg/m  Wt Readings from Last 3 Encounters:  01/13/20 180 lb (81.6 kg)  05/06/19 191 lb (86.6 kg)  02/02/19 190 lb (86.2 kg)   General Appearance: Well nourished, in no apparent distress. Eyes: PERRLA, EOMs, conjunctiva no swelling or erythema Sinuses:  No Frontal/maxillary tenderness ENT/Mouth: Ext aud canals clear, TMs without erythema, bulging. No erythema, swelling, or exudate on post pharynx.  Tonsils not swollen or erythematous. Hearing normal.  Neck: Supple, thyroid normal.  Respiratory: Respiratory effort normal, BS equal bilaterally without rales, rhonchi, wheezing or stridor.  Cardio: RRR with no MRGs. Brisk peripheral pulses without edema.  Abdomen: Soft, + BS,  Non tender, no guarding, rebound, hernias, masses. Lymphatics: Non tender without lymphadenopathy.  Musculoskeletal: Full ROM, 5/5 strength, Normal gait Skin:  Warm, dry without , lesions, ecchymosis.  Neuro: Cranial nerves intact. Normal muscle tone, no cerebellar symptoms. Psych: Awake and oriented X 3, normal affect, Insight and Judgment appropriate.    [name], PA-C 1:18 PM [facility]

## 2020-01-13 ENCOUNTER — Other Ambulatory Visit: Payer: Self-pay

## 2020-01-13 ENCOUNTER — Ambulatory Visit: Payer: 59 | Admitting: Physician Assistant

## 2020-01-13 ENCOUNTER — Encounter: Payer: Self-pay | Admitting: Physician Assistant

## 2020-01-13 VITALS — BP 120/80 | HR 75 | Temp 97.5°F | Ht 71.5 in | Wt 180.0 lb

## 2020-01-13 DIAGNOSIS — D649 Anemia, unspecified: Secondary | ICD-10-CM

## 2020-01-13 DIAGNOSIS — E291 Testicular hypofunction: Secondary | ICD-10-CM

## 2020-01-13 DIAGNOSIS — Z79899 Other long term (current) drug therapy: Secondary | ICD-10-CM

## 2020-01-13 DIAGNOSIS — R059 Cough, unspecified: Secondary | ICD-10-CM

## 2020-01-13 DIAGNOSIS — I10 Essential (primary) hypertension: Secondary | ICD-10-CM

## 2020-01-13 DIAGNOSIS — R05 Cough: Secondary | ICD-10-CM

## 2020-01-13 DIAGNOSIS — E785 Hyperlipidemia, unspecified: Secondary | ICD-10-CM | POA: Diagnosis not present

## 2020-01-13 DIAGNOSIS — E559 Vitamin D deficiency, unspecified: Secondary | ICD-10-CM

## 2020-01-13 MED ORDER — TADALAFIL 20 MG PO TABS: 20.0000 mg | ORAL_TABLET | Freq: Every day | ORAL | 2 refills | Status: DC | PRN

## 2020-01-13 MED ORDER — BREZTRI AEROSPHERE 160-9-4.8 MCG/ACT IN AERO: 1.0000 | INHALATION_SPRAY | Freq: Two times a day (BID) | RESPIRATORY_TRACT | 0 refills | Status: DC

## 2020-01-13 MED ORDER — TESTOSTERONE 20.25 MG/ACT (1.62%) TD GEL: TRANSDERMAL | 1 refills | Status: DC

## 2020-01-13 NOTE — Patient Instructions (Addendum)
Can try the prilosec for 2-4 weeks Follow up with allergist  Get on mucinex and increase water Can refer to ENT if not better.   INFORMATION ABOUT YOUR STEROID INHALER  Can do steroid inhaler, NEED TO DO DAILY, this is NOT a rescue inhaler so if you are acutely short of breath please use your albuterol or call 911.  Do 1 puff TWICE a day.  Do before you brush your teeth OR wash your mouth afterwards.  IF YOU DO NOT [place] YOUR MOUTH OUT IT CAN CAUSE YEAST Can do 2 tsp vinegar with water and switch to help prevent yeast or help yeast in your mouth.   Go to the ER if any chest pain, shortness of breath, nausea, dizziness, severe HA, changes vision/speech   Fatty liver or Nonalcoholic fatty liver disease (NASH)  Now the leading cause of liver failure in the [place].  It is normally from such risk factors as obesity, diabetes, insulin resistance, high cholesterol, or metabolic syndrome.  The only definitive therapy is weight loss and exercise.    Suggest walking 20-30 mins daily.  Decreasing carbohydrates, increasing veggies.  Vitamin E 800 IU a day may be beneficial. Liver cancer has been noted in patient with fatty liver without cirrhosis.  Will monitor closely   Fatty Liver Fatty liver is the accumulation of fat in liver cells. It is also called hepatosteatosis or steatohepatitis. It is normal for your liver to contain some fat. If fat is more than 5 to 10% of your liver's weight, you have fatty liver.  There are often no symptoms (problems) for years while damage is still occurring. People often learn about their fatty liver when they have medical tests for other reasons. Fat can damage your liver for years or even decades without causing problems. When it becomes severe, it can cause fatigue, weight loss, weakness, and confusion. This makes you more likely to develop more serious liver problems. The liver is the largest organ in the body. It does a lot of work and often gives no  warning signs when it is sick until late in a disease. The liver has many important jobs including:  Breaking down foods.  Storing vitamins, iron, and other minerals.  Making proteins.  Making bile for food digestion.  Breaking down many products including medications, alcohol and some poisons.  PROGNOSIS  Fatty liver may cause no damage or it can lead to an inflammation of the liver. This is, called steatohepatitis.  Over time the liver may become scarred and hardened. This condition is called cirrhosis. Cirrhosis is serious and may lead to liver failure or cancer. NASH is one of the leading causes of cirrhosis. About 10-20% of Americans have fatty liver and a smaller 2-5% has NASH.  TREATMENT   Weight loss, fat restriction, and exercise in overweight patients produces inconsistent results but is worth trying.  Good control of diabetes may reduce fatty liver.  Eat a balanced, healthy diet.  Increase your physical activity.  There are no medical or surgical treatments for a fatty liver or NASH, but improving your diet and increasing your exercise may help prevent or reverse some of the damage.

## 2020-01-14 LAB — CBC WITH DIFFERENTIAL/PLATELET
Absolute Monocytes: 482 cells/uL (ref 200–950)
Basophils Absolute: 42 cells/uL (ref 0–200)
Basophils Relative: 0.8 %
Eosinophils Absolute: 180 cells/uL (ref 15–500)
Eosinophils Relative: 3.4 %
HCT: 45.2 % (ref 38.5–50.0)
Hemoglobin: 15.2 g/dL (ref 13.2–17.1)
Lymphs Abs: 986 cells/uL (ref 850–3900)
MCH: 32.2 pg (ref 27.0–33.0)
MCHC: 33.6 g/dL (ref 32.0–36.0)
MCV: 95.8 fL (ref 80.0–100.0)
MPV: 11.5 fL (ref 7.5–12.5)
Monocytes Relative: 9.1 %
Neutro Abs: 3609 cells/uL (ref 1500–7800)
Neutrophils Relative %: 68.1 %
Platelets: 204 10*3/uL (ref 140–400)
RBC: 4.72 10*6/uL (ref 4.20–5.80)
RDW: 11.8 % (ref 11.0–15.0)
Total Lymphocyte: 18.6 %
WBC: 5.3 10*3/uL (ref 3.8–10.8)

## 2020-01-14 LAB — COMPLETE METABOLIC PANEL WITH GFR
AG Ratio: 1.5 (calc) (ref 1.0–2.5)
ALT: 38 U/L (ref 9–46)
AST: 30 U/L (ref 10–35)
Albumin: 4.3 g/dL (ref 3.6–5.1)
Alkaline phosphatase (APISO): 40 U/L (ref 35–144)
BUN: 11 mg/dL (ref 7–25)
CO2: 29 mmol/L (ref 20–32)
Calcium: 9.6 mg/dL (ref 8.6–10.3)
Chloride: 104 mmol/L (ref 98–110)
Creat: 0.94 mg/dL (ref 0.70–1.33)
GFR, Est African American: 105 mL/min/{1.73_m2} (ref >=60)
GFR, Est Non African American: 90 mL/min/{1.73_m2} (ref >=60)
Globulin: 2.9 g/dL (calc) (ref 1.9–3.7)
Glucose, Bld: 99 mg/dL (ref 65–99)
Potassium: 4.3 mmol/L (ref 3.5–5.3)
Sodium: 140 mmol/L (ref 135–146)
Total Bilirubin: 1 mg/dL (ref 0.2–1.2)
Total Protein: 7.2 g/dL (ref 6.1–8.1)

## 2020-01-14 LAB — VITAMIN D 25 HYDROXY (VIT D DEFICIENCY, FRACTURES): Vit D, 25-Hydroxy: 66 ng/mL (ref 30–100)

## 2020-01-14 LAB — LIPID PANEL
Cholesterol: 137 mg/dL (ref <200)
HDL: 52 mg/dL (ref >=40)
LDL Cholesterol (Calc): 71 mg/dL (calc)
Non-HDL Cholesterol (Calc): 85 mg/dL (calc) (ref <130)
Total CHOL/HDL Ratio: 2.6 (calc) (ref <5.0)
Triglycerides: 67 mg/dL (ref <150)

## 2020-01-14 LAB — IRON, TOTAL/TOTAL IRON BINDING CAP: Iron: 121 ug/dL (ref 50–180)

## 2020-01-14 LAB — MAGNESIUM: Magnesium: 2 mg/dL (ref 1.5–2.5)

## 2020-01-14 LAB — TSH: TSH: 1.17 mIU/L (ref 0.40–4.50)

## 2020-01-14 LAB — VITAMIN B12: Vitamin B-12: 483 pg/mL (ref 200–1100)

## 2020-01-17 ENCOUNTER — Other Ambulatory Visit: Payer: Self-pay | Admitting: Physician Assistant

## 2020-01-17 DIAGNOSIS — E291 Testicular hypofunction: Secondary | ICD-10-CM

## 2020-01-17 MED ORDER — TESTOSTERONE 20.25 MG/ACT (1.62%) TD GEL: TRANSDERMAL | 1 refills | Status: DC

## 2020-04-03 ENCOUNTER — Other Ambulatory Visit: Payer: Self-pay | Admitting: Adult Health

## 2020-05-04 ENCOUNTER — Other Ambulatory Visit: Payer: Self-pay

## 2020-05-04 ENCOUNTER — Ambulatory Visit (INDEPENDENT_AMBULATORY_CARE_PROVIDER_SITE_OTHER): Payer: 59

## 2020-05-04 VITALS — Temp 97.7°F

## 2020-05-04 DIAGNOSIS — Z23 Encounter for immunization: Secondary | ICD-10-CM | POA: Diagnosis not present

## 2020-05-08 ENCOUNTER — Encounter: Payer: 59 | Admitting: Physician Assistant

## 2020-05-18 ENCOUNTER — Other Ambulatory Visit: Payer: Self-pay

## 2020-05-18 ENCOUNTER — Encounter: Payer: Self-pay | Admitting: Adult Health

## 2020-05-18 ENCOUNTER — Ambulatory Visit: Payer: 59 | Admitting: Adult Health

## 2020-05-18 VITALS — BP 110/70 | HR 56 | Temp 97.5°F | Ht 71.5 in | Wt 181.0 lb

## 2020-05-18 DIAGNOSIS — N529 Male erectile dysfunction, unspecified: Secondary | ICD-10-CM

## 2020-05-18 DIAGNOSIS — D649 Anemia, unspecified: Secondary | ICD-10-CM

## 2020-05-18 DIAGNOSIS — E785 Hyperlipidemia, unspecified: Secondary | ICD-10-CM

## 2020-05-18 DIAGNOSIS — J45909 Unspecified asthma, uncomplicated: Secondary | ICD-10-CM

## 2020-05-18 DIAGNOSIS — F3342 Major depressive disorder, recurrent, in full remission: Secondary | ICD-10-CM

## 2020-05-18 DIAGNOSIS — Z1329 Encounter for screening for other suspected endocrine disorder: Secondary | ICD-10-CM

## 2020-05-18 DIAGNOSIS — Z0000 Encounter for general adult medical examination without abnormal findings: Secondary | ICD-10-CM

## 2020-05-18 DIAGNOSIS — E291 Testicular hypofunction: Secondary | ICD-10-CM

## 2020-05-18 DIAGNOSIS — I10 Essential (primary) hypertension: Secondary | ICD-10-CM | POA: Diagnosis not present

## 2020-05-18 DIAGNOSIS — Z79899 Other long term (current) drug therapy: Secondary | ICD-10-CM

## 2020-05-18 DIAGNOSIS — K2100 Gastro-esophageal reflux disease with esophagitis, without bleeding: Secondary | ICD-10-CM

## 2020-05-18 DIAGNOSIS — Z6824 Body mass index (BMI) 24.0-24.9, adult: Secondary | ICD-10-CM

## 2020-05-18 DIAGNOSIS — Z136 Encounter for screening for cardiovascular disorders: Secondary | ICD-10-CM

## 2020-05-18 DIAGNOSIS — T7840XS Allergy, unspecified, sequela: Secondary | ICD-10-CM

## 2020-05-18 DIAGNOSIS — Z131 Encounter for screening for diabetes mellitus: Secondary | ICD-10-CM

## 2020-05-18 DIAGNOSIS — Z125 Encounter for screening for malignant neoplasm of prostate: Secondary | ICD-10-CM

## 2020-05-18 DIAGNOSIS — Z1389 Encounter for screening for other disorder: Secondary | ICD-10-CM

## 2020-05-18 DIAGNOSIS — L409 Psoriasis, unspecified: Secondary | ICD-10-CM

## 2020-05-18 DIAGNOSIS — E559 Vitamin D deficiency, unspecified: Secondary | ICD-10-CM

## 2020-05-18 DIAGNOSIS — Z85828 Personal history of other malignant neoplasm of skin: Secondary | ICD-10-CM

## 2020-05-18 MED ORDER — VARDENAFIL HCL 20 MG PO TABS: 20.0000 mg | ORAL_TABLET | ORAL | 1 refills | Status: DC | PRN

## 2020-05-18 MED ORDER — OLMESARTAN MEDOXOMIL 20 MG PO TABS: ORAL_TABLET | ORAL | 1 refills | Status: DC

## 2020-05-18 MED ORDER — MELOXICAM 15 MG PO TABS: ORAL_TABLET | ORAL | 1 refills | Status: DC

## 2020-05-18 NOTE — Progress Notes (Signed)
Complete Physical  Assessment and Plan:  Encounter for Routine physical exam without abnormal findings  Essential hypertension - monitor at home, if still elevated we will send in alpha blocker, DASH diet, exercise and monitor at home. Call if greater than 130/80.  - CBC with Differential/Platelet - CMP/GFR - TSH - Urinalysis, Routine w reflex microscopic (not at [facility]) - Microalbumin / creatinine urine ratio  Hyperlipidemia -continue medications, check lipids, decrease fatty foods, increase activity.  - Lipid panel  Hypogonadism male Hypogonadism- continue replacement therapy, check testosterone levels as needed.  - Testosterone   Asthma, mild intermittent, uncomplicated inhaler PRN  Depression, remission Off of meds, doing well   Gastroesophageal reflux disease with esophagitis Get on PPI, will refer to GI if not better  Erectile dysfunction, unspecified erectile dysfunction type meds PRN- switch back to levitra  Vitamin D deficiency - Vit D  25 hydroxy (rtn osteoporosis monitoring)  History of basal cell carcinoma Follows Derm, reminded to schedule  Psoriasis Follows derm annually and rheum PRN  Screening, anemia, deficiency Patient requested check today  -     Iron,TIBC, ferritin -     Vitamin B12  Screening PSA (prostate specific antigen) -     PSA  Orders Placed This Encounter  Procedures   CBC with Differential/Platelet   COMPLETE METABOLIC PANEL WITH GFR   Magnesium   Lipid panel   TSH   Hemoglobin A1c   VITAMIN D 25 Hydroxy (Vit-D Deficiency, Fractures)   PSA   Testosterone   Microalbumin / creatinine urine ratio   Urinalysis, Routine w reflex microscopic   Vitamin B12   Iron, TIBC and Ferritin Panel   EKG 12-Lead    Discussed med's effects and SE's. Screening labs and tests as requested with regular follow-up as recommended.  Future Appointments  Date Time Provider [place]  11/16/2020  8:45 AM Liane Comber, NP  [facility] None  05/21/2021  9:00 AM Liane Comber, NP [facility] None    HPI 59 y.o. male presents for CPE. He has Plantar fasciitis; Allergy; Asthma; Depression; GERD (gastroesophageal reflux disease); ED (erectile dysfunction); Hypertension; Hyperlipidemia; Hypogonadism male; DJD of shoulder; History of basal cell carcinoma; Psoriasis; Medication management; and Vitamin D deficiency on their problem list.   Married, no children, 2 dogs. He is a Pharmacist, community.   He has history of joint pain, has psoriasis and possible psoriatic arthritis, He has intermittent R medial epicondyle tenderness, wrist stiffness wear brace, follows intermittently with Dr. Patrecia Pour, at this time he is only doing topical treatment and mobic PRN - needing refilled, no DMARD.  Hx of depression in remission off of meds, continues with counseling and finds this beneficial.    He has allergies, follows with allergist for allergy shots, he is taking claritin, recently doing neti pot for congestion instead of nasal sprays.   Hx of GERD on omeprazole daily and famotidine PRN, was advised to reduce alcohol intake with improved sx.   Hx of BCC of scalp, follows annually with dermatology.   BMI is Body mass index is 24.89 kg/m., he has been working on diet and exercise. He does drink 1-2 alcoholic drinks nightly, up from prior to the pandemic, discussed reducing to <7/week.   Wt Readings from Last 3 Encounters:  05/18/20 181 lb (82.1 kg)  01/13/20 180 lb (81.6 kg)  05/06/19 191 lb (86.6 kg)    His blood pressure has been controlled at home, he is taking 1/2 tab of 40 mg omeprazole, today their BP is BP:  110/70.  He does workout. He denies chest pain, shortness of breath, dizziness.  He had negative at home screening sleep study with dentist.  He is on cholesterol medication, atorvastatin 10 mg daily and denies myalgias. His cholesterol is at goal. The cholesterol last visit was:   Lab Results  Component Value Date   CHOL  137 01/13/2020   HDL 52 01/13/2020   LDLCALC 71 01/13/2020   TRIG 67 01/13/2020   CHOLHDL 2.6 01/13/2020    Last A1C in the office was:  Lab Results  Component Value Date   HGBA1C 5.4 02/09/2015   He has a history of testosterone deficiency and is on testosterone replacement. He states that the testosterone helps with his energy, libido, muscle mass. He has ED, has been using cialis 10-20 mg, requesting switch back to previous levitra 20 mg which seemed to work better for him.  Lab Results  Component Value Date   TESTOSTERONE 663 05/06/2019    He does have ongoing dribbling (life long) and the stream has gotten mildly slower, denies sense of incomplete emptying.  Lab Results  Component Value Date   PSA 0.8 05/06/2019   PSA 1.1 02/14/2016   PSA 1.58 02/09/2015   Patient is on Vitamin D supplement.   Lab Results  Component Value Date   VD25OH 66 01/13/2020     Current Medications:  Current Outpatient Medications on File Prior to Visit  Medication Sig Dispense Refill   albuterol (VENTOLIN HFA) 108 (90 BASE) MCG/ACT inhaler Inhale 2 puffs into the lungs every 4 (four) hours as needed for wheezing or shortness of breath. 1 Inhaler 3   aspirin 81 MG chewable tablet Chew by mouth daily.     atorvastatin (LIPITOR) 10 MG tablet Take      1 tablet      Daily       for Cholesterol 90 tablet 0   diclofenac sodium (VOLTAREN) 1 % GEL 3 g to 3 large joints three times daily 3 Tube 3   famotidine (PEPCID) 40 MG tablet Take 1 tablet at Bedtime for Indigestion & Acid Reflux 90 tablet 3   loratadine (CLARITIN) 10 MG tablet Take 10 mg by mouth daily.     Omega-3 Fatty Acids (FISH OIL PO) Take by mouth daily.     omeprazole (PRILOSEC) 40 MG capsule Take 1 capsule (40 mg total) by mouth daily as needed. 30 capsule 3   Oxymetazoline HCl (UPNEEQ) 0.1 % SOLN Apply to eye daily.     Testosterone 20.25 MG/ACT (1.62%) GEL APPLY 2 PUMPS TO EACH ARM DAILY. 150 g 1   triamcinolone cream  (KENALOG) 0.1 % Apply 1 application topically 2 (two) times daily. 45 g 1   FLECTOR 1.3 % PTCH APPLY ONE PATCH TOPICALLY TWICE A DAY AS DIRECTED. (Patient not taking: Reported on 05/18/2020) 180 patch 1   No current facility-administered medications on file prior to visit.   Health Maintenance:  Immunization History  Administered Date(s) Administered   Influenza Inj Mdck Quad Pf 04/24/2017   Influenza Inj Mdck Quad With Preservative 02/26/2018, 05/04/2020   Influenza Split 03/28/2013, 03/30/2014   Influenza, Seasonal, Injecte, Preservative Fre 05/04/2015   PFIZER SARS-COV-2 Vaccination 06/24/2019, 07/15/2019   Tdap 02/20/2017   Tetanus: 2018 Pneumovax: Prevnar 13:  Flu vaccine: 04/2020 Shingrix: - discussed, will get at pharmacy  Covid 19: 2/2, 2021, pfizer   Colonoscopy: 02/23/2015 due 10 years EGD:  Last eye: 2021, annually, glasses, consulting surgery for drooping eyelid, doing  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, 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, is a dentist  Last derm: 2019, overdue, will schedule   Medical History:  Patient Active Problem List   Diagnosis Date Noted   Medication management 05/04/2019   Vitamin D deficiency 05/04/2019   History of basal cell carcinoma 02/09/2015   Psoriasis 02/09/2015   Hypogonadism male 08/18/2014   DJD of shoulder 08/18/2014   Hypertension    Hyperlipidemia    Allergy    Asthma    Depression    GERD (gastroesophageal reflux disease)    ED (erectile dysfunction)    Plantar fasciitis 01/06/2012   Allergies Allergies  Allergen Reactions   Enalapril Cough    SURGICAL HISTORY He  has a past surgical history that includes Vasectomy (2013); orthopedic surgery (Left); Hernia repair (Bilateral, 1970); LASIK; and scalp biopsy. FAMILY HISTORY His family history includes Aortic stenosis in his father; Asthma in his father; Cancer in his paternal grandmother; Heart attack in his paternal grandfather; Kidney disease in his father; Rheum arthritis in his  father; Spondylitis in his brother; Uterine cancer in his mother. SOCIAL HISTORY He  reports that he has quit smoking. His smoking use included cigars. He has never used smokeless tobacco. He reports current alcohol use. He reports that he does not use drugs.   Review of Systems  Constitutional: Negative for malaise/fatigue and weight loss.  HENT: Negative for hearing loss and tinnitus.   Eyes: Negative for blurred vision and double vision.  Respiratory: Negative for cough, shortness of breath and wheezing.   Cardiovascular: Negative for chest pain, palpitations, orthopnea, claudication and leg swelling.  Gastrointestinal: Negative for abdominal pain, blood in stool, constipation, diarrhea, heartburn, melena, nausea and vomiting.  Genitourinary: Negative.   Musculoskeletal: Negative for joint pain (intermittent R medial elbow and wristsfd) and myalgias.  Skin: Negative for rash.  Neurological: Negative for dizziness, tingling, sensory change, weakness and headaches.  Endo/Heme/Allergies: Negative for polydipsia.  Psychiatric/Behavioral: Negative.   All other systems reviewed and are negative.   Physical Exam: Estimated body mass index is 24.89 kg/m as calculated from the following:   Height as of this encounter: 5' 11.5" (1.816 m).   Weight as of this encounter: 181 lb (82.1 kg). BP 110/70    Pulse (!) 56    Temp (!) 97.5 F (36.4 C)    Ht 5' 11.5" (1.816 m)    Wt 181 lb (82.1 kg)    SpO2 99%    BMI 24.89 kg/m  General Appearance: Well nourished, in no apparent distress. Eyes: PERRLA, EOMs, conjunctiva no swelling or erythema. Mild left lid ptosis.  Sinuses: No Frontal/maxillary tenderness ENT/Mouth: Ext aud canals clear, normal light reflex with TMs without erythema, bulging. Good dentition. No erythema, swelling, or exudate on post pharynx. Tonsils not swollen or erythematous. Hearing normal.  Neck: Supple, thyroid normal. No bruits Respiratory: Respiratory effort normal, BS equal  bilaterally without rales, rhonchi, wheezing or stridor. Cardio: RRR without murmurs, rubs or gallops. Brisk peripheral pulses without edema.  Chest: symmetric, with normal excursions and percussion. Abdomen: Soft, +BS. Non tender, no guarding, rebound, hernias, masses, or organomegaly.  Lymphatics: Non tender without lymphadenopathy.  Genitourinary: defer Musculoskeletal: Full ROM all peripheral extremities,5/5 strength, and normal gait. Mildly tender R medial epicondyle without erythema or swelling. Wrist full ROM without crepitus, bony enlargement, neg phalen's, intact strength and distal sensation Skin: Warm, dry without rashes, lesions, ecchymosis.  Neuro: Cranial nerves intact, reflexes equal bilaterally. Normal muscle tone, no cerebellar symptoms. Sensation intact.  Psych: Awake and oriented X  3, normal affect, Insight and Judgment appropriate.   EKG: Sinus rhythm, WNL  Treylon Henard C Christorpher Hisaw 11:15 AM

## 2020-05-18 NOTE — Patient Instructions (Addendum)
Stephen Campbell , Thank you for taking time to come for your Annual Wellness Visit. I appreciate your ongoing commitment to your health goals. Please review the following plan we discussed and let me know if I can assist you in the future.   This is a list of the screening recommended for you and due dates:  Health Maintenance  Topic Date Due   Colon Cancer Screening  02/22/2025   Tetanus Vaccine  02/21/2027   Flu Shot  Completed   COVID-19 Vaccine  Completed    Hepatitis C: One time screening is recommended by Center for Disease Control  (CDC) for  adults born from [birth year] through 1963.   Completed   HIV Screening  Completed      Check with insurance shingrix - can get at [facility]    Know what a healthy weight is for you (roughly BMI <25) and aim to maintain this  Aim for 7+ servings of fruits and vegetables daily  65-80+ fluid ounces of water or unsweet tea for healthy kidneys  Limit to max 1 drink of alcohol per day; avoid smoking/tobacco  Limit animal fats in diet for cholesterol and heart health - choose grass fed whenever available  Avoid highly processed foods, and foods high in saturated/trans fats  Aim for low stress - take time to unwind and care for your mental health  Aim for 150 min of moderate intensity exercise weekly for heart health, and weights twice weekly for bone health  Aim for 7-9 hours of sleep daily       Zoster Vaccine, Recombinant injection What is this medicine? ZOSTER VACCINE (ZOS ter vak SEEN) is used to prevent shingles in adults 57 years old and over. This vaccine is not used to treat shingles or nerve pain from shingles. This medicine may be used for other purposes; ask your health care provider or pharmacist if you have questions. COMMON BRAND NAME(S): [facility] What should I tell my health care provider before I take this medicine? They need to know if you have any of these conditions:  blood disorders or disease  cancer like leukemia or  lymphoma  immune system problems or therapy  an unusual or allergic reaction to vaccines, other medications, foods, dyes, or preservatives  pregnant or trying to get pregnant  breast-feeding How should I use this medicine? This vaccine is for injection in a muscle. It is given by a health care professional. Talk to your pediatrician regarding the use of this medicine in children. This medicine is not approved for use in children. Overdosage: If you think you have taken too much of this medicine contact a poison control center or emergency room at once. NOTE: This medicine is only for you. Do not share this medicine with others. What if I miss a dose? Keep appointments for follow-up (booster) doses as directed. It is important not to miss your dose. Call your doctor or health care professional if you are unable to keep an appointment. What may interact with this medicine?  medicines that suppress your immune system  medicines to treat cancer  steroid medicines like prednisone or cortisone This list may not describe all possible interactions. Give your health care provider a list of all the medicines, herbs, non-prescription drugs, or dietary supplements you use. Also tell them if you smoke, drink alcohol, or use illegal drugs. Some items may interact with your medicine. What should I watch for while using this medicine? Visit your doctor for regular check ups. This  vaccine, like all vaccines, may not fully protect everyone. What side effects may I notice from receiving this medicine? Side effects that you should report to your doctor or health care professional as soon as possible:  allergic reactions like skin rash, itching or hives, swelling of the face, lips, or tongue  breathing problems Side effects that usually do not require medical attention (report these to your doctor or health care professional if they continue or are bothersome):  chills  headache  fever  nausea,  vomiting  redness, warmth, pain, swelling or itching at site where injected  tiredness This list may not describe all possible side effects. Call your doctor for medical advice about side effects. You may report side effects to FDA at 1-800-FDA-1088. Where should I keep my medicine? This vaccine is only given in a clinic, pharmacy, doctor's office, or other health care setting and will not be stored at home. NOTE: This sheet is a summary. It may not cover all possible information. If you have questions about this medicine, talk to your doctor, pharmacist, or health care provider.  2020 Elsevier/Gold Standard (2017-01-12 13:20:30)

## 2020-05-19 ENCOUNTER — Encounter: Payer: Self-pay | Admitting: Adult Health

## 2020-05-19 DIAGNOSIS — E538 Deficiency of other specified B group vitamins: Secondary | ICD-10-CM | POA: Insufficient documentation

## 2020-05-19 LAB — COMPLETE METABOLIC PANEL WITH GFR
AG Ratio: 1.6 (calc) (ref 1.0–2.5)
ALT: 43 U/L (ref 9–46)
AST: 29 U/L (ref 10–35)
Albumin: 4.6 g/dL (ref 3.6–5.1)
Alkaline phosphatase (APISO): 38 U/L (ref 35–144)
BUN: 15 mg/dL (ref 7–25)
CO2: 27 mmol/L (ref 20–32)
Calcium: 10 mg/dL (ref 8.6–10.3)
Chloride: 102 mmol/L (ref 98–110)
Creat: 1.02 mg/dL (ref 0.70–1.33)
GFR, Est African American: 94 mL/min/{1.73_m2} (ref >=60)
GFR, Est Non African American: 81 mL/min/{1.73_m2} (ref >=60)
Globulin: 2.9 g/dL (calc) (ref 1.9–3.7)
Glucose, Bld: 104 mg/dL — ABNORMAL HIGH (ref 65–99)
Potassium: 4.5 mmol/L (ref 3.5–5.3)
Sodium: 141 mmol/L (ref 135–146)
Total Bilirubin: 0.8 mg/dL (ref 0.2–1.2)
Total Protein: 7.5 g/dL (ref 6.1–8.1)

## 2020-05-19 LAB — MAGNESIUM: Magnesium: 1.8 mg/dL (ref 1.5–2.5)

## 2020-05-19 LAB — MICROALBUMIN / CREATININE URINE RATIO
Creatinine, Urine: 287 mg/dL (ref 20–320)
Microalb Creat Ratio: 7 mcg/mg creat (ref <30)
Microalb, Ur: 1.9 mg/dL

## 2020-05-19 LAB — LIPID PANEL
Cholesterol: 152 mg/dL (ref <200)
HDL: 61 mg/dL (ref >=40)
LDL Cholesterol (Calc): 75 mg/dL (calc)
Non-HDL Cholesterol (Calc): 91 mg/dL (calc) (ref <130)
Total CHOL/HDL Ratio: 2.5 (calc) (ref <5.0)
Triglycerides: 75 mg/dL (ref <150)

## 2020-05-19 LAB — CBC WITH DIFFERENTIAL/PLATELET
Absolute Monocytes: 518 cells/uL (ref 200–950)
Basophils Absolute: 68 cells/uL (ref 0–200)
Basophils Relative: 0.9 %
Eosinophils Absolute: 188 cells/uL (ref 15–500)
Eosinophils Relative: 2.5 %
HCT: 44.7 % (ref 38.5–50.0)
Hemoglobin: 15.1 g/dL (ref 13.2–17.1)
Lymphs Abs: 1223 cells/uL (ref 850–3900)
MCH: 32.1 pg (ref 27.0–33.0)
MCHC: 33.8 g/dL (ref 32.0–36.0)
MCV: 95.1 fL (ref 80.0–100.0)
MPV: 11.4 fL (ref 7.5–12.5)
Monocytes Relative: 6.9 %
Neutro Abs: 5505 cells/uL (ref 1500–7800)
Neutrophils Relative %: 73.4 %
Platelets: 207 10*3/uL (ref 140–400)
RBC: 4.7 10*6/uL (ref 4.20–5.80)
RDW: 11.3 % (ref 11.0–15.0)
Total Lymphocyte: 16.3 %
WBC: 7.5 10*3/uL (ref 3.8–10.8)

## 2020-05-19 LAB — VITAMIN D 25 HYDROXY (VIT D DEFICIENCY, FRACTURES): Vit D, 25-Hydroxy: 46 ng/mL (ref 30–100)

## 2020-05-19 LAB — URINALYSIS, ROUTINE W REFLEX MICROSCOPIC
Bilirubin Urine: NEGATIVE
Glucose, UA: NEGATIVE
Hgb urine dipstick: NEGATIVE
Ketones, ur: NEGATIVE
Leukocytes,Ua: NEGATIVE
Nitrite: NEGATIVE
Protein, ur: NEGATIVE
Specific Gravity, Urine: 1.024 (ref 1.001–1.03)
pH: 5.5 (ref 5.0–8.0)

## 2020-05-19 LAB — IRON,TIBC AND FERRITIN PANEL
%SAT: 47 % (calc) (ref 20–48)
Ferritin: 193 ng/mL (ref 38–380)
Iron: 157 ug/dL (ref 50–180)
TIBC: 336 mcg/dL (calc) (ref 250–425)

## 2020-05-19 LAB — HEMOGLOBIN A1C
Hgb A1c MFr Bld: 5.2 % of total Hgb (ref <5.7)
Mean Plasma Glucose: 103 (calc)
eAG (mmol/L): 5.7 (calc)

## 2020-05-19 LAB — VITAMIN B12: Vitamin B-12: 390 pg/mL (ref 200–1100)

## 2020-05-19 LAB — TESTOSTERONE: Testosterone: 438 ng/dL (ref 250–827)

## 2020-05-19 LAB — PSA: PSA: 0.88 ng/mL (ref <=4.0)

## 2020-05-19 LAB — TSH: TSH: 1.1 mIU/L (ref 0.40–4.50)

## 2020-06-17 NOTE — Progress Notes (Signed)
Office Visit Note  Patient: Stephen Campbell             Date of Birth: [birth year]           MRN: [id number]             PCP: Unk Pinto, MD Referring: Unk Pinto, MD Visit Date: 06/29/2020 Occupation: _0 @  Subjective:  Other (Hip pain, bilateral hand tingling )   History of Present Illness: Stephen Campbell is a 60 y.o. male with history of psoriasis, Planter fasciitis and HLA-B27 positive.  He returns today after his last visit on May 29, 2017.  He has been very active and running on a regular basis now.  He denies any episodes of Achilles tendinitis or plantar fasciitis.  He still have some itching in his right foot.  He has been having nocturnal pain especially over bilateral hips when he sleeps on his side.  He has been having some discomfort in his right hand and his right elbow.  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  He has tried some meloxicam which helped with the discomfort.  He has some psoriasis patches on extremities and on the face.  There is no history of iritis or episcleritis.  He experiences some numbness in his bilateral hands which she describes in his third fourth and fifth fingers bilaterally.  He has also noticed a nodule on his left middle finger which has been bothersome.  Activities of Daily Living:  Patient reports morning stiffness for 0 minutes.   Patient Reports nocturnal pain.  Difficulty dressing/grooming: Denies Difficulty climbing stairs: Denies Difficulty getting out of chair: Denies Difficulty using hands for taps, buttons, cutlery, and/or writing: Reports  Review of Systems  Constitutional: Negative for fatigue.  HENT: Positive for mouth sores. Negative for mouth dryness and nose dryness.   Eyes: Positive for pain. Negative for itching and dryness.  Respiratory: Positive for shortness of breath. Negative for difficulty breathing.   Cardiovascular: Negative for chest pain and palpitations.  Gastrointestinal:  Negative for blood in stool, constipation and diarrhea.  Endocrine: Negative for increased urination.  Genitourinary: Negative for difficulty urinating.  Musculoskeletal: Positive for arthralgias, joint pain, joint swelling, myalgias, muscle tenderness and myalgias. Negative for morning stiffness.  Skin: Positive for rash. Negative for color change and redness.  Allergic/Immunologic: Negative for susceptible to infections.  Neurological: Positive for dizziness and parasthesias. Negative for numbness, headaches, memory loss and weakness.  Hematological: Negative for bruising/bleeding tendency.  Psychiatric/Behavioral: Negative for confusion.    PMFS History:  Patient Active Problem List   Diagnosis Date Noted  . B12 deficiency 05/19/2020  . Medication management 05/04/2019  . Vitamin D deficiency 05/04/2019  . History of basal cell carcinoma 02/09/2015  . Psoriasis 02/09/2015  . Hypogonadism male 08/18/2014  . 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 Hypertension   . Hyperlipidemia   . Allergy   . Asthma   . Depression   . GERD (gastroesophageal reflux disease)   . ED (erectile dysfunction)   . Plantar fasciitis 01/06/2012    Past Medical History:  Diagnosis Date  . Allergy   . Anxiety   . Asthma   . Basal cell carcinoma   . Depression   . ED (erectile dysfunction)   . GERD (gastroesophageal reflux disease)   . Hyperlipidemia   . Hypertension   . Hypogonadism in male   . Osteoarthritis   . Vitamin D deficiency     Family  History  Problem Relation Age of Onset  . Uterine cancer Mother   . Asthma Father   . Kidney disease Father   . Rheum arthritis Father   . Aortic stenosis Father        valve replacement  . Ankylosing spondylitis Brother   . Cancer Paternal Grandmother   . Heart attack Paternal Grandfather   . Colon cancer Neg Hx   . Colon polyps Neg Hx   . Gallbladder disease Neg Hx   . Esophageal cancer Neg Hx   . Diabetes Neg Hx    Past Surgical History:   Procedure Laterality Date  . HERNIA REPAIR Bilateral 1970   60 yrs old, inguinal   . LASIK    . ORTHOPEDIC SURGERY Left    left shoulder, bone spur, Dr. Denice Bors  . scalp biopsy     basal cell   . VASECTOMY  2013   Social History   Social History Narrative  . Not on file   Immunization History  Administered Date(s) Administered  . Influenza Inj Mdck Quad Pf 04/24/2017  . Influenza Inj Mdck Quad With Preservative 02/26/2018, 05/04/2020  . Influenza Split 03/28/2013, 03/30/2014  . Influenza, Seasonal, Injecte, Preservative Fre 05/04/2015  . PFIZER SARS-COV-2 Vaccination 06/24/2019, 07/15/2019, 06/10/2020  . Tdap 02/20/2017     Objective: Vital Signs: BP 123/86 (BP Location: Left Arm, Patient Position: Sitting, Cuff Size: Normal)   Pulse 84   Resp 16   Ht 6' (1.829 m)   Wt 182 lb (82.6 kg)   BMI 24.68 kg/m    Physical Exam Vitals and nursing note reviewed.  Constitutional:      Appearance: He is well-developed and well-nourished.  HENT:     Head: Normocephalic and atraumatic.  Eyes:     Extraocular Movements: EOM normal.     Conjunctiva/sclera: Conjunctivae normal.     Pupils: Pupils are equal, round, and reactive to light.  Cardiovascular:     Rate and Rhythm: Normal rate and regular rhythm.     Heart sounds: Normal heart sounds.  Pulmonary:     Effort: Pulmonary effort is normal.     Breath sounds: Normal breath sounds.  Abdominal:     General: Bowel sounds are normal.     Palpations: Abdomen is soft.  Musculoskeletal:     Cervical back: Normal range of motion and neck supple.  Skin:    General: Skin is warm and dry.     Capillary Refill: Capillary refill takes less than 2 seconds.  Neurological:     Mental Status: He is alert and oriented to person, place, and time.  Psychiatric:        Mood and Affect: Mood and affect normal.        Behavior: Behavior normal.      Musculoskeletal Exam: C-spine thoracic and lumbar spine were in good range of motion.   He had no tenderness over SI joints.  Shoulder joints, elbow joints, wrist joints with good range of motion.  He has some tenderness over bilateral medial epicondyle region.  He had some tenderness over left wrist with no synovitis.  PIP and DIP thickening was noted.  A subcutaneous nodule was noted on the base of his left middle finger on the ulnar aspect.  Hip joints are in good range of motion.  He had tenderness over bilateral trochanteric bursa.  Knee joints, ankle joints with good range of motion.  There was no evidence of Achilles tendinitis or plantar fasciitis.  PIP and  DIP thickening in bilateral feet was noted.  CDAI Exam: CDAI Score: -- Patient Global: --; Provider Global: -- Swollen: --; Tender: -- Joint Exam 06/29/2020   No joint exam has been documented for this visit   There is currently no information documented on the homunculus. Go to the Rheumatology activity and complete the homunculus joint exam.  Investigation: No additional findings.  Imaging: No results found.  Recent Labs: Lab Results  Component Value Date   WBC 7.5 05/18/2020   HGB 15.1 05/18/2020   PLT 207 05/18/2020   NA 141 05/18/2020   K 4.5 05/18/2020   CL 102 05/18/2020   CO2 27 05/18/2020   GLUCOSE 104 (H) 05/18/2020   BUN 15 05/18/2020   CREATININE 1.02 05/18/2020   BILITOT 0.8 05/18/2020   ALKPHOS 39 (L) 08/15/2016   AST 29 05/18/2020   ALT 43 05/18/2020   PROT 7.5 05/18/2020   ALBUMIN 4.4 08/15/2016   CALCIUM 10.0 05/18/2020   GFRAA 94 05/18/2020    Speciality Comments: No specialty comments available.  Procedures:  No procedures performed Allergies: Enalapril   Assessment / Plan:     Visit Diagnoses: Psoriasis-he has few psoriasis patches.  HLA B27 positive  Medial epicondylitis of both elbows -he has been experiencing some tenderness over bilateral medial epicondyle region.  He also complains of numbness in his bilateral third fourth and fifth fingers.  Use of topical  anti-inflammatories was discussed.  Have given him a handout on exercises.  He may benefit from physical therapy.  I will refer him to hand surgery for numbness in his bilateral hands and evaluation.  Plan: Ambulatory referral to Orthopedic Surgery  Nodule of finger of left hand -a nodule was noted over the ulnar aspect at the base of his left middle finger.  Plan: Ambulatory referral to Orthopedic Surgery  Primary osteoarthritis of both hands-DIP and PIP thickening was noted.  Trochanteric bursitis of both hips-he has bilateral trochanteric bursitis.  A mattress pad or change of mattress was advised.  IT band stretches were advised.  I discussed referral to physical therapy.  She will discuss this further with his wife who is a retired Community education officer.  He will contact me if he needs referral for physical therapy.  My plan is to refer him to physical therapy at [facility].  Chondromalacia patellae, right knee-currently not symptomatic.  Plantar fasciitis-he denies having any recent episodes of plantar fasciitis.  He has been wearing proper fitting shoes with good arch support.  Primary osteoarthritis of both feet-he continues to have some discomfort in his feet.  Bilateral calcaneal spurs  DDD (degenerative disc disease), lumbar-he has been running and has been active.  He denies any lower back pain.  Other medical problems are listed as follows:  History of gastroesophageal reflux (GERD)  History of hypercholesterolemia  History of asthma  History of anxiety  History of hypertension  History of basal cell carcinoma  COVID-19 virus infection - July 2021.  He has been fully vaccinated against COVID-19.  Use of mask, social distancing and hand hygiene was discussed.  Orders: Orders Placed This Encounter  Procedures  . Ambulatory referral to Orthopedic Surgery   No orders of the defined types were placed in this encounter.   Follow-Up Instructions: Return if symptoms  worsen or fail to improve, for Psoriasis.   Bo Merino, MD  Note - This record has been created using Editor, commissioning.  Chart creation errors have been sought, but may not always  have  been located. Such creation errors do not reflect on  the standard of medical care.

## 2020-06-22 ENCOUNTER — Other Ambulatory Visit: Payer: Self-pay | Admitting: Internal Medicine

## 2020-06-22 DIAGNOSIS — E291 Testicular hypofunction: Secondary | ICD-10-CM

## 2020-06-22 MED ORDER — TESTOSTERONE 20.25 MG/ACT (1.62%) TD GEL: TRANSDERMAL | 1 refills | Status: DC

## 2020-06-29 ENCOUNTER — Other Ambulatory Visit: Payer: Self-pay

## 2020-06-29 ENCOUNTER — Ambulatory Visit: Payer: 59 | Admitting: Rheumatology

## 2020-06-29 ENCOUNTER — Encounter: Payer: Self-pay | Admitting: Rheumatology

## 2020-06-29 VITALS — BP 123/86 | HR 84 | Resp 16 | Ht 72.0 in | Wt 182.0 lb

## 2020-06-29 DIAGNOSIS — M19071 Primary osteoarthritis, right ankle and foot: Secondary | ICD-10-CM

## 2020-06-29 DIAGNOSIS — M7701 Medial epicondylitis, right elbow: Secondary | ICD-10-CM | POA: Diagnosis not present

## 2020-06-29 DIAGNOSIS — M7702 Medial epicondylitis, left elbow: Secondary | ICD-10-CM

## 2020-06-29 DIAGNOSIS — M5136 Other intervertebral disc degeneration, lumbar region: Secondary | ICD-10-CM

## 2020-06-29 DIAGNOSIS — Z8659 Personal history of other mental and behavioral disorders: Secondary | ICD-10-CM

## 2020-06-29 DIAGNOSIS — M7062 Trochanteric bursitis, left hip: Secondary | ICD-10-CM

## 2020-06-29 DIAGNOSIS — M7732 Calcaneal spur, left foot: Secondary | ICD-10-CM

## 2020-06-29 DIAGNOSIS — M19072 Primary osteoarthritis, left ankle and foot: Secondary | ICD-10-CM

## 2020-06-29 DIAGNOSIS — L409 Psoriasis, unspecified: Secondary | ICD-10-CM | POA: Diagnosis not present

## 2020-06-29 DIAGNOSIS — Z1589 Genetic susceptibility to other disease: Secondary | ICD-10-CM | POA: Diagnosis not present

## 2020-06-29 DIAGNOSIS — R2232 Localized swelling, mass and lump, left upper limb: Secondary | ICD-10-CM

## 2020-06-29 DIAGNOSIS — M67911 Unspecified disorder of synovium and tendon, right shoulder: Secondary | ICD-10-CM

## 2020-06-29 DIAGNOSIS — M19042 Primary osteoarthritis, left hand: Secondary | ICD-10-CM

## 2020-06-29 DIAGNOSIS — Z8719 Personal history of other diseases of the digestive system: Secondary | ICD-10-CM

## 2020-06-29 DIAGNOSIS — Z85828 Personal history of other malignant neoplasm of skin: Secondary | ICD-10-CM

## 2020-06-29 DIAGNOSIS — M2241 Chondromalacia patellae, right knee: Secondary | ICD-10-CM

## 2020-06-29 DIAGNOSIS — U071 COVID-19: Secondary | ICD-10-CM

## 2020-06-29 DIAGNOSIS — Z8709 Personal history of other diseases of the respiratory system: Secondary | ICD-10-CM

## 2020-06-29 DIAGNOSIS — M19041 Primary osteoarthritis, right hand: Secondary | ICD-10-CM

## 2020-06-29 DIAGNOSIS — M7061 Trochanteric bursitis, right hip: Secondary | ICD-10-CM

## 2020-06-29 DIAGNOSIS — M722 Plantar fascial fibromatosis: Secondary | ICD-10-CM

## 2020-06-29 DIAGNOSIS — Z8639 Personal history of other endocrine, nutritional and metabolic disease: Secondary | ICD-10-CM

## 2020-06-29 DIAGNOSIS — Z8679 Personal history of other diseases of the circulatory system: Secondary | ICD-10-CM

## 2020-06-29 DIAGNOSIS — M7731 Calcaneal spur, right foot: Secondary | ICD-10-CM

## 2020-06-29 NOTE — Patient Instructions (Signed)
Iliotibial Band Syndrome Rehab Ask your health care provider which exercises are safe for you. Do exercises exactly as told by your health care provider and adjust them as directed. It is normal to feel mild stretching, pulling, tightness, or discomfort as you do these exercises. Stop right away if you feel sudden pain or your pain gets significantly worse. Do not begin these exercises until told by your health care provider. Stretching and range-of-motion exercises These exercises warm up your muscles and joints and improve the movement and flexibility of your hip and pelvis. Quadriceps stretch, prone 1. Lie on your abdomen (prone position) on a firm surface, such as a bed or padded floor. 2. Bend your left / right knee and reach back to hold your ankle or pant leg. If you cannot reach your ankle or pant leg, loop a belt around your foot and grab the belt instead. 3. Gently pull your heel toward your buttocks. Your knee should not slide out to the side. You should feel a stretch in the front of your thigh and knee (quadriceps). 4. Hold this position for __________ seconds. Repeat __________ times. Complete this exercise __________ times a day.   Iliotibial band stretch An iliotibial band is a strong band of muscle tissue that runs from the outer side of your hip to the outer side of your thigh and knee. 1. Lie on your side with your left / right leg in the top position. 2. Bend both of your knees and grab your left / right ankle. Stretch out your bottom arm to help you balance. 3. Slowly bring your top knee back so your thigh goes behind your trunk. 4. Slowly lower your top leg toward the floor until you feel a gentle stretch on the outside of your left / right hip and thigh. If you do not feel a stretch and your knee will not fall farther, place the heel of your other foot on top of your knee and pull your knee down toward the floor with your foot. 5. Hold this position for __________  seconds. Repeat __________ times. Complete this exercise __________ times a day.   Strengthening exercises These exercises build strength and endurance in your hip and pelvis. Endurance is the ability to use your muscles for a long time, even after they get tired. Straight leg raises, side-lying This exercise strengthens the muscles that rotate the leg at the hip and move it away from your body (hip abductors). 1. Lie on your side with your left / right leg in the top position. Lie so your head, shoulder, hip, and knee line up. You may bend your bottom knee to help you balance. 2. Roll your hips slightly forward so your hips are stacked directly over each other and your left / right knee is facing forward. 3. Tense the muscles in your outer thigh and lift your top leg 4-6 inches (10-15 cm). 4. Hold this position for __________ seconds. 5. Slowly lower your leg to return to the starting position. Let your muscles relax completely before doing another repetition. Repeat __________ times. Complete this exercise __________ times a day.   Leg raises, prone This exercise strengthens the muscles that move the hips backward (hip extensors). 1. Lie on your abdomen (prone position) on your bed or a firm surface. You can put a pillow under your hips if that is more comfortable for your lower back. 2. Bend your left / right knee so your foot is straight up in the air.   3. Squeeze your buttocks muscles and lift your left / right thigh off the bed. Do not let your back arch. 4. Tense your thigh muscle as hard as you can without increasing any knee pain. 5. Hold this position for __________ seconds. 6. Slowly lower your leg to return to the starting position and allow it to relax completely. Repeat __________ times. Complete this exercise __________ times a day. Hip hike 1. Stand sideways on a bottom step. Stand on your left / right leg with your other foot unsupported next to the step. You can hold on to a  railing or wall for balance if needed. 2. Keep your knees straight and your torso square. Then lift your left / right hip up toward the ceiling. 3. Slowly let your left / right hip lower toward the floor, past the starting position. Your foot should get closer to the floor. Do not lean or bend your knees. Repeat __________ times. Complete this exercise __________ times a day. This information is not intended to replace advice given to you by your health care provider. Make sure you discuss any questions you have with your health care provider. Document Revised: 08/10/2019 Document Reviewed: 08/10/2019 [employer] Patient Education  2021 [employer] Elbow and Forearm Exercises Ask your health care provider which exercises are safe for you. Do exercises exactly as told by your health care provider and adjust them as directed. It is normal to feel mild stretching, pulling, tightness, or discomfort as you do these exercises. Stop right away if you feel sudden pain or your pain gets worse. Do not begin these exercises until told by your health care provider. Range-of-motion exercises These exercises warm up your muscles and joints and improve the movement and flexibility of your injured elbow and forearm. The exercises also help to relieve pain, numbness, and tingling. These exercises are done using the muscles in your injured elbow and forearm (active). Elbow flexion, active 1. Hold your left / right arm at your side, and bend your elbow (flexion) as far as you can using only your arm muscles. 2. Hold this position for __________ seconds. 3. Slowly return to the starting position. Repeat __________ times. Complete this exercise __________ times a day. Elbow extension, active 1. Hold your left / right arm at your side, and straighten your elbow (extension) as much as you can using only your arm muscles. 2. Hold this position for __________ seconds. 3. Slowly return to the starting position. Repeat  __________ times. Complete this exercise __________ times a day. Active forearm rotation, supination This is an exercise in which you turn (rotate) your forearm palm up (supination). 1. Stand or sit with your elbows at your sides. 2. Bend your left / right elbow to a 90-degree angle (right angle). 3. Rotate your palm up until you feel a gentle stretch on the inside of your forearm. 4. Hold this position for __________ seconds. 5. Slowly return to the starting position. Repeat __________ times. Complete this exercise __________ times a day. Active forearm rotation, pronation This is an exercise in which you turn (rotate) your forearm palm down (pronation). 1. Stand or sit with your elbows at your sides. 2. Bend your left / right elbow to a 90-degree angle (right angle). 3. Rotate your palm down until you feel a gentle stretch on the top of your forearm. 4. Hold this position for __________ seconds. 5. Slowly return to the starting position. Repeat __________ times. Complete this exercise __________ times a day. Stretching exercises   These exercises warm up your muscles and joints and improve the movement and flexibility of your injured elbow and forearm. These exercises also help to relieve pain, numbness, and tingling. These exercises are done using your healthy elbow and forearm to help stretch the muscles in your injured elbow and forearm (active-assisted). Elbow flexion, active-assisted 1. Hold your left / right arm at your side, and bend your elbow (flexion) as much as you can using your left / right arm muscles. 2. Use your other hand to bend your left / right elbow farther. To do this, gently push up on your forearm until you feel a gentle stretch on the back of your elbow. 3. Hold this position for __________ seconds. 4. Slowly return to the starting position. Repeat __________ times. Complete this exercise __________ times a day.   Elbow extension, active-assisted 1. Hold your left /  right arm at your side, and straighten your elbow (extension) as much as you can using your left / right arm muscles. 2. Use your other hand to straighten the left / right elbow farther. To do this, gently push down on your forearm until you feel a gentle stretch on the inside of your elbow. 3. Hold this position for __________ seconds. 4. Slowly return to the starting position. Repeat __________ times. Complete this exercise __________ times a day.   Active-assisted forearm rotation, supination This is an exercise in which you turn (rotate) your forearm palm up (supination). 1. Sit with your left / right elbow bent in a 90-degree angle (right angle) with your forearm resting on a table. 2. Keeping your upper body and shoulder still, rotate your forearm so your palm faces upward. 3. Use your other hand to help rotate your forearm further until you feel a gentle to moderate stretch. 4. Hold this position for __________ seconds. 5. Slowly release the stretch and return to the starting position. Repeat __________ times. Complete this exercise __________ times a day. Active-assisted forearm rotation, pronation This is an exercise in which you turn (rotate) your forearm palm down (pronation). 1. Sit with your left / right elbow bent in a 90-degree angle (right angle) with your forearm resting on a table. 2. Keeping your upper body and shoulder still, rotate your forearm so your palm faces the tabletop. 3. Use your other hand to help rotate your forearm further until you feel a gentle to moderate stretch. 4. Hold this position for __________ seconds. 5. Slowly release the stretch and return to the starting position. Repeat __________ times. Complete this exercise __________ times a day.   Passive elbow flexion, supine 1. Lie on your back (supine position). 2. Extend your left / right arm up in the air, bracing it with your other hand. 3. Let your left / right hand slowly lower toward your shoulder  (passive flexion), while your elbow stays pointed toward the ceiling. You should feel a gentle stretch along the back of your upper arm and elbow. 4. If instructed by your health care provider, you may increase the intensity of your stretch by adding a small wrist weight or hand weight. 5. Hold this position for __________ seconds. 6. Slowly return to the starting position. Repeat __________ times. Complete this exercise __________ times a day. Passive elbow extension, supine 1. Lie on your back (supine position). Make sure that you are in a comfortable position that lets you relax your arm muscles. 2. Place a folded towel under your left / right upper arm so your elbow and   shoulder are at the same height. Straighten your left / right arm so your elbow does not rest on the bed or towel. 3. Let the weight of your hand stretch your elbow (passive extension). Keep your arm and chest muscles relaxed. You should feel a stretch on the inside of your elbow. 4. If told by your health care provider, you may increase the intensity of your stretch by adding a small wrist weight or hand weight. 5. Hold this position for __________ seconds. 6. Slowly release the stretch. Repeat __________ times. Complete this exercise __________ times a day.   Strengthening exercises These exercises build strength and endurance in your elbow and forearm. Endurance is the ability to use your muscles for a long time, even after they get tired. Elbow flexion, isometric 1. Stand or sit up straight. 2. Bend your left / right elbow in a 90-degree angle (right angle), and keep your forearm at the height of your waist. Your thumb should be pointed toward the ceiling (neutral forearm). 3. Place your other hand on top of your left / right forearm. Gently push down while you resist with your left / right arm (isometric flexion). Push as hard as you can with both arms without causing any pain or movement at your left / right  elbow. 4. Hold this position for __________ seconds. 5. Slowly release the tension in both arms. Let your muscles relax completely before you repeat the exercise. Repeat __________ times. Complete this exercise __________ times a day.   Elbow extension, isometric 1. Stand or sit up straight. 2. Place your left / right arm so your palm faces your abdomen and is at the height of your waist. 3. Place your other hand on the underside of your left / right forearm. Gently push up while you resist with your left / right arm (isometric extension). Push as hard as you can with both arms without causing any pain or movement at your left / right elbow. 4. Hold this position for __________ seconds. 5. Slowly release the tension in both arms. Let your muscles relax completely before you repeat the exercise. Repeat __________ times. Complete this exercise __________ times a day.   Elbow flexion with forearm palm up 1. Sit on a firm chair without armrests, or stand up. 2. Place your left / right arm at your side with your elbow straight and your palm facing forward. 3. Holding a __________weight or gripping a rubber exercise band or tubing, bend your elbow to bring your hand toward your shoulder (flexion). 4. Hold this position for __________ seconds. 5. Slowly return to the starting position. Repeat __________ times. Complete this exercise __________ times a day.   Elbow extension, active 1. Sit on a firm chair without armrests, or stand up. 2. Hold a rubber exercise band or tubing in both hands. 3. Keeping your upper arms at your sides, bring both hands up to your left / right shoulder. Keep your left / right hand just below your other hand. 4. Straighten your left / right elbow (extension) while keeping your other arm still. 5. Hold this position for __________ seconds. 6. Control the resistance of the band or tubing as you return to the starting position. Repeat __________ times. Complete this exercise  __________ times a day.   Forearm rotation, supination 1. Sit with your left / right forearm supported on a table. Your elbow should be at waist height and bent at a 90-degree angle (right angle). 2. Gently grasp a lightweight   hammer. 3. Rest your hand over the edge of the table with your palm facing down. 4. Without moving your left / right elbow, slowly rotate your forearm to turn your palm up toward the ceiling (supination). 5. Hold this position for __________ seconds. 6. Slowly return to the starting position. Repeat __________ times. Complete this exercise __________ times a day.   Forearm rotation, pronation 1. Sit with your left / right forearm supported on a table. Keep your elbow below shoulder height. 2. Gently grasp a lightweight hammer. 3. Rest your hand over the edge of the table with your palm facing up. 4. Without moving your left / right elbow, slowly rotate your forearm to turn your palm down toward the floor (pronation). 5. Hold this position for __________seconds. 6. Slowly return to the starting position. Repeat __________ times. Complete this exercise __________ times a day.   This information is not intended to replace advice given to you by your health care provider. Make sure you discuss any questions you have with your health care provider. Document Revised: 09/23/2018 Document Reviewed: 06/23/2018 Elsevier Patient Education  2021 [employer]  

## 2020-07-31 ENCOUNTER — Other Ambulatory Visit: Payer: Self-pay | Admitting: Internal Medicine

## 2020-07-31 DIAGNOSIS — K2100 Gastro-esophageal reflux disease with esophagitis, without bleeding: Secondary | ICD-10-CM

## 2020-09-24 ENCOUNTER — Other Ambulatory Visit: Payer: Self-pay | Admitting: Internal Medicine

## 2020-09-24 ENCOUNTER — Other Ambulatory Visit: Payer: Self-pay | Admitting: Adult Health

## 2020-09-24 DIAGNOSIS — E291 Testicular hypofunction: Secondary | ICD-10-CM

## 2020-09-25 ENCOUNTER — Other Ambulatory Visit: Payer: Self-pay | Admitting: Internal Medicine

## 2020-11-14 HISTORY — PX: PTOSIS REPAIR: SHX6568

## 2020-11-16 ENCOUNTER — Encounter: Payer: Self-pay | Admitting: Adult Health

## 2020-11-16 ENCOUNTER — Other Ambulatory Visit: Payer: Self-pay

## 2020-11-16 ENCOUNTER — Ambulatory Visit: Payer: 59 | Admitting: Adult Health

## 2020-11-16 VITALS — BP 112/80 | HR 74 | Temp 97.7°F | Wt 181.0 lb

## 2020-11-16 DIAGNOSIS — Z79899 Other long term (current) drug therapy: Secondary | ICD-10-CM

## 2020-11-16 DIAGNOSIS — E291 Testicular hypofunction: Secondary | ICD-10-CM | POA: Diagnosis not present

## 2020-11-16 DIAGNOSIS — E785 Hyperlipidemia, unspecified: Secondary | ICD-10-CM | POA: Diagnosis not present

## 2020-11-16 DIAGNOSIS — I10 Essential (primary) hypertension: Secondary | ICD-10-CM

## 2020-11-16 DIAGNOSIS — R6889 Other general symptoms and signs: Secondary | ICD-10-CM

## 2020-11-16 DIAGNOSIS — R0989 Other specified symptoms and signs involving the circulatory and respiratory systems: Secondary | ICD-10-CM

## 2020-11-16 DIAGNOSIS — E559 Vitamin D deficiency, unspecified: Secondary | ICD-10-CM

## 2020-11-16 DIAGNOSIS — Z1589 Genetic susceptibility to other disease: Secondary | ICD-10-CM

## 2020-11-16 DIAGNOSIS — F3342 Major depressive disorder, recurrent, in full remission: Secondary | ICD-10-CM

## 2020-11-16 MED ORDER — ALPRAZOLAM 0.5 MG PO TABS: 0.5000 mg | ORAL_TABLET | Freq: Three times a day (TID) | ORAL | 0 refills | Status: AC | PRN

## 2020-11-16 MED ORDER — NATESTO 5.5 MG/ACT NA GEL: 5.5000 mg | Freq: Three times a day (TID) | NASAL | 5 refills | Status: DC

## 2020-11-16 NOTE — Progress Notes (Signed)
6 MONTH FOLLOW UP  Assessment and Plan:   Essential hypertension - continue medication - DASH diet, exercise and monitor at home. Call if greater than 130/80.  - CBC with Differential/Platelet - CMP/GFR - magnesium  Hyperlipidemia -continue medications, check lipids, decrease fatty foods, increase activity.  - Lipid panel - TSH  Hypogonadism male Hypogonadism- continue replacement therapy, will try nasal-  check testosterone levels as needed.  - Testosterone - defer  Vitamin D deficiency - Continue supplement  Anxiety Typically well controlled, rare flares (xanax) Continue follow up with therapist Well managed by current regimen; continue medications Stress management techniques discussed, increase water, good sleep hygiene discussed, increase exercise, and increase veggies.   Throat clearing Chronic, not improved with allergy/GERD meds Referred to ENT for laryngoscopy    Orders Placed This Encounter  Procedures  . CBC with Differential/Platelet  . COMPLETE METABOLIC PANEL WITH GFR  . Magnesium  . Lipid panel  . TSH  . Ambulatory referral to ENT    Discussed med's effects and SE's. Screening labs and tests as requested with regular follow-up as recommended.  Future Appointments  Date Time Provider [place]  05/24/2021  9:00 AM Liane Comber, NP [facility] None    HPI 60 y.o. male presents for 6 month follow up on htn, hyperlipidemia, depression/anxiety, GERD, testicular hypofunction, vitamin D def.   He has allergies, follows with allergist for allergy shots, he is taking claritin, recently doing neti pot for congestion, also does dymista. instead of nasal sprays.   Hx of GERD on omeprazole daily and famotidine PRN, was advised to reduce alcohol intake with improved sx.   He reports frequent throat clearing in the morning, persistently ongoing for several years, would like referral to have throat. Has tried elevating HOB.   He has hx of  depression/anxiety, off of daily agent and does well, continues to follow up with therapist, rare use of xanax for episodic flares last a few days.   BMI is Body mass index is 24.55 kg/m., he has been working on diet and exercise.  Wt Readings from Last 3 Encounters:  11/16/20 181 lb (82.1 kg)  06/29/20 182 lb (82.6 kg)  05/18/20 181 lb (82.1 kg)    His blood pressure has been controlled at home, he is taking 20 mg olmesartan, today their BP is BP: 112/80.  He does workout. He denies chest pain, shortness of breath, dizziness. He had negative at home screening sleep study with dentist.  He is on cholesterol medication, atorvastatin 10 mg daily and denies myalgias. His cholesterol is at goal. The cholesterol last visit was:   Lab Results  Component Value Date   CHOL 152 05/18/2020   HDL 61 05/18/2020   LDLCALC 75 05/18/2020   TRIG 75 05/18/2020   CHOLHDL 2.5 05/18/2020    Last A1C in the office was:  Lab Results  Component Value Date   HGBA1C 5.2 05/18/2020   He has a history of testosterone deficiency and is on testosterone replacement. He states that the testosterone helps with his energy, libido, muscle mass. He has ED, switched back to previous levitra 20 mg which works better for him.  Lab Results  Component Value Date   TESTOSTERONE 438 05/18/2020    Patient is on Vitamin D supplement.   Lab Results  Component Value Date   VD25OH 60 05/18/2020   He has not gotten on a B12 Lab Results  Component Value Date   VITAMINB12 390 05/18/2020  Current Medications:  Current Outpatient Medications on File Prior to Visit  Medication Sig Dispense Refill  . albuterol (VENTOLIN HFA) 108 (90 BASE) MCG/ACT inhaler Inhale 2 puffs into the lungs every 4 (four) hours as needed for wheezing or shortness of breath. 1 Inhaler 3  . atorvastatin (LIPITOR) 10 MG tablet TAKE 1 TABLET ONCE DAILY FOR CHOLESTEROL. 90 tablet 3  . diclofenac sodium (VOLTAREN) 1 % GEL 3 g to 3 large joints  three times daily 3 Tube 3  . famotidine (PEPCID) 40 MG tablet TAKE 1 TABLET AT BEDTIME FOR ACID REFLUX AND INDIGESTION. 90 tablet 3  . levocetirizine (XYZAL) 5 MG tablet Take 5 mg by mouth daily.    Marland Kitchen MAGNESIUM PO Take by mouth daily.    . meloxicam (MOBIC) 15 MG tablet Take 1/2 to 1 tablet  Daily  with Food for Pain & Inflammation & try limit to 5 days/week to Avoid Kidney Damage 90 tablet 1  . olmesartan (BENICAR) 20 MG tablet TAKE 1/2 TO 1 TABLET DAILY AS DIRECTED FOR BP GOAL <130/80. 90 tablet 1  . Omega-3 Fatty Acids (FISH OIL PO) Take by mouth daily.    Marland Kitchen omeprazole (PRILOSEC) 40 MG capsule Take 1 capsule (40 mg total) by mouth daily as needed. 30 capsule 3  . Oxymetazoline HCl (UPNEEQ) 0.1 % SOLN Apply to eye daily.    Marland Kitchen triamcinolone cream (KENALOG) 0.1 % Apply 1 application topically 2 (two) times daily. 45 g 1  . vardenafil (LEVITRA) 20 MG tablet Take 1 tablet (20 mg total) by mouth as needed for erectile dysfunction. 20 tablet 1   No current facility-administered medications on file prior to visit.    Medical History:  Patient Active Problem List   Diagnosis Date Noted  . HLA B27 (HLA B27 positive) 11/16/2020  . B12 deficiency 05/19/2020  . Medication management 05/04/2019  . Vitamin D deficiency 05/04/2019  . History of basal cell carcinoma 02/09/2015  . Psoriasis 02/09/2015  . Testicular hypofunction 08/18/2014  . 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 Hypertension   . Hyperlipidemia   . Allergy   . Asthma   . Depression   . GERD (gastroesophageal reflux disease)   . ED (erectile dysfunction)   . Plantar fasciitis 01/06/2012   Allergies Allergies  Allergen Reactions  . Enalapril Cough    SURGICAL HISTORY He  has a past surgical history that includes Vasectomy (2013); orthopedic surgery (Left); Hernia repair (Bilateral, 1970); LASIK; and scalp biopsy. FAMILY HISTORY His family history includes Ankylosing spondylitis in his brother; Aortic stenosis in his father; Asthma  in his father; Cancer in his paternal grandmother; Heart attack in his paternal grandfather; Kidney disease in his father; Rheum arthritis in his father; Uterine cancer in his mother.  Review of Systems  Constitutional: Negative for malaise/fatigue and weight loss.  HENT: Negative for congestion, hearing loss, sore throat and tinnitus.        Chronic throat clearing, worse in AM  Eyes: Negative for blurred vision and double vision.  Respiratory: Negative for cough, shortness of breath and wheezing.   Cardiovascular: Negative for chest pain, palpitations, orthopnea, claudication and leg swelling.  Gastrointestinal: Negative for abdominal pain, blood in stool, constipation, diarrhea, heartburn, melena, nausea and vomiting.  Genitourinary: Negative.   Musculoskeletal: Negative for joint pain and myalgias.  Skin: Negative for rash.  Neurological: Negative for dizziness, tingling, sensory change, weakness and headaches.  Endo/Heme/Allergies: Negative for polydipsia.  Psychiatric/Behavioral: Negative for depression and substance abuse. The patient is  nervous/anxious (rare use of xanax, controlled). The patient does not have insomnia.   All other systems reviewed and are negative.   Physical Exam: Estimated body mass index is 24.55 kg/m as calculated from the following:   Height as of 06/29/20: 6' (1.829 m).   Weight as of this encounter: 181 lb (82.1 kg). BP 112/80   Pulse 74   Temp 97.7 F (36.5 C)   Wt 181 lb (82.1 kg)   SpO2 97%   BMI 24.55 kg/m  General Appearance: Well nourished, in no apparent distress. Eyes: PERRLA, EOMs, conjunctiva no swelling or erythema. Mild left lid ptosis.  Sinuses: No Frontal/maxillary tenderness ENT/Mouth: Ext aud canals clear, normal light reflex with TMs without erythema, bulging. Good dentition. No erythema, swelling, or exudate on post pharynx. Tonsils not swollen or erythematous. Hearing normal.  Neck: Supple, thyroid normal.  Respiratory:  Respiratory effort normal, BS equal bilaterally without rales, rhonchi, wheezing or stridor. Cardio: RRR without murmurs, rubs or gallops. Brisk peripheral pulses without edema.  Abdomen: Soft, +BS. Non tender, no guarding, rebound, hernias, masses, or organomegaly.  Lymphatics: Non tender without lymphadenopathy.  Musculoskeletal: Full ROM all peripheral extremities,5/5 strength, and normal gait. Skin: Warm, dry without rashes, lesions, ecchymosis.  Neuro: Cranial nerves intact, reflexes equal bilaterally. Normal muscle tone Psych: Awake and oriented X 3, normal affect, Insight and Judgment appropriate.   Gorden Harms Demara Lover 1:11 PM

## 2020-11-17 LAB — COMPLETE METABOLIC PANEL WITH GFR
AG Ratio: 1.7 (calc) (ref 1.0–2.5)
ALT: 36 U/L (ref 9–46)
AST: 30 U/L (ref 10–35)
Albumin: 4.5 g/dL (ref 3.6–5.1)
Alkaline phosphatase (APISO): 35 U/L (ref 35–144)
BUN: 14 mg/dL (ref 7–25)
CO2: 28 mmol/L (ref 20–32)
Calcium: 9.9 mg/dL (ref 8.6–10.3)
Chloride: 102 mmol/L (ref 98–110)
Creat: 0.96 mg/dL (ref 0.70–1.33)
GFR, Est African American: 101 mL/min/{1.73_m2} (ref >=60)
GFR, Est Non African American: 87 mL/min/{1.73_m2} (ref >=60)
Globulin: 2.7 g/dL (calc) (ref 1.9–3.7)
Glucose, Bld: 106 mg/dL — ABNORMAL HIGH (ref 65–99)
Potassium: 4.6 mmol/L (ref 3.5–5.3)
Sodium: 139 mmol/L (ref 135–146)
Total Bilirubin: 1 mg/dL (ref 0.2–1.2)
Total Protein: 7.2 g/dL (ref 6.1–8.1)

## 2020-11-17 LAB — CBC WITH DIFFERENTIAL/PLATELET
Absolute Monocytes: 417 cells/uL (ref 200–950)
Basophils Absolute: 39 cells/uL (ref 0–200)
Basophils Relative: 0.8 %
Eosinophils Absolute: 211 cells/uL (ref 15–500)
Eosinophils Relative: 4.3 %
HCT: 46.8 % (ref 38.5–50.0)
Hemoglobin: 15.2 g/dL (ref 13.2–17.1)
Lymphs Abs: 1122 cells/uL (ref 850–3900)
MCH: 30.6 pg (ref 27.0–33.0)
MCHC: 32.5 g/dL (ref 32.0–36.0)
MCV: 94.4 fL (ref 80.0–100.0)
MPV: 11.4 fL (ref 7.5–12.5)
Monocytes Relative: 8.5 %
Neutro Abs: 3112 cells/uL (ref 1500–7800)
Neutrophils Relative %: 63.5 %
Platelets: 203 10*3/uL (ref 140–400)
RBC: 4.96 10*6/uL (ref 4.20–5.80)
RDW: 11.9 % (ref 11.0–15.0)
Total Lymphocyte: 22.9 %
WBC: 4.9 10*3/uL (ref 3.8–10.8)

## 2020-11-17 LAB — LIPID PANEL
Cholesterol: 147 mg/dL (ref <200)
HDL: 62 mg/dL (ref >=40)
LDL Cholesterol (Calc): 71 mg/dL (calc)
Non-HDL Cholesterol (Calc): 85 mg/dL (calc) (ref <130)
Total CHOL/HDL Ratio: 2.4 (calc) (ref <5.0)
Triglycerides: 49 mg/dL (ref <150)

## 2020-11-17 LAB — MAGNESIUM: Magnesium: 1.7 mg/dL (ref 1.5–2.5)

## 2020-11-17 LAB — TSH: TSH: 1.28 mIU/L (ref 0.40–4.50)

## 2021-01-04 ENCOUNTER — Other Ambulatory Visit: Payer: Self-pay | Admitting: Internal Medicine

## 2021-03-25 ENCOUNTER — Other Ambulatory Visit: Payer: Self-pay | Admitting: Adult Health

## 2021-04-28 ENCOUNTER — Other Ambulatory Visit: Payer: Self-pay | Admitting: Adult Health

## 2021-04-28 DIAGNOSIS — N529 Male erectile dysfunction, unspecified: Secondary | ICD-10-CM

## 2021-05-21 ENCOUNTER — Encounter: Payer: 59 | Admitting: Adult Health

## 2021-05-23 NOTE — Progress Notes (Signed)
Complete Physical  Assessment and Plan:  Encounter for Annual Physical Exam with abnormal findings Due annually  Health Maintenance reviewed Healthy lifestyle reviewed and goals set  Essential hypertension - monitor at home. Call if frequently greater than 130/80.  - DASH diet, exercise and monitor at home.  - CBC with Differential/Platelet - CMP/GFR - TSH - Urinalysis, Routine w reflex microscopic (not at [facility]) - Microalbumin / creatinine urine ratio  Hyperlipidemia -continue medications, check lipids, decrease fatty foods, increase activity.  - Lipid panel  Hypogonadism male Hypogonadism- continue replacement therapy, check testosterone levels as needed.  - Testosterone   Asthma, mild intermittent, uncomplicated inhaler PRN  Depression, remission Off of meds, doing well, continues counseling for maintenance  Gastroesophageal reflux disease with esophagitis Continue PM PPI, avoid large meals late at night,  Advised to reduce alcohol intake, he plans dry January.   Erectile dysfunction, unspecified erectile dysfunction type meds PRN- levitra  Vitamin D deficiency - Vit D  25 hydroxy (rtn osteoporosis monitoring)  History of basal cell carcinoma Follows Derm, reminded to schedule  Psoriasis Follows derm annually and rheum PRN  B12 deficiency -     Vitamin B12  Screening PSA (prostate specific antigen) -     PSA  Hoarseness/chronic throat clearing - ENT referral for laryngoscopy  - continue PPI, allergy treatments, reduce alcohol, GERD lifestyle  Orders Placed This Encounter  Procedures   CBC with Differential/Platelet   COMPLETE METABOLIC PANEL WITH GFR   Magnesium   Lipid panel   TSH   Hemoglobin A1c   VITAMIN D 25 Hydroxy (Vit-D Deficiency, Fractures)   Testosterone   PSA   Vitamin B12   Microalbumin / creatinine urine ratio   Urinalysis, Routine w reflex microscopic   Ambulatory referral to ENT   EKG 12-Lead     Discussed med's effects  and SE's. Screening labs and tests as requested with regular follow-up as recommended.  Future Appointments  Date Time Provider [place]  11/29/2021  9:30 AM Liane Comber, NP [facility] None  05/27/2022  9:00 AM Liane Comber, NP [facility] None    HPI 60 y.o. male presents for CPE. He has Plantar fasciitis; Allergy; Asthma; Depression; GERD (gastroesophageal reflux disease); ED (erectile dysfunction); Hypertension; Hyperlipidemia; Testicular hypofunction; DJD of shoulder; History of basal cell carcinoma; Psoriasis; Medication management; Vitamin D deficiency; B12 deficiency; and HLA B27 (HLA B27 positive) on their problem list.   Married, no children, 2 dogs. He is a Pharmacist, community. Not ready to retire yet but thinking about future.   He has history of HLAB27+, joint pain, has psoriasis and possible psoriatic arthritis, He follows intermittently with Dr. Patrecia Pour, at this time he is only doing topical treatment and mobic PRN, no DMARD.  Hx of depression in remission off of meds, continues with counseling with Jackelyn Hoehn and finds this beneficial. Uses xanax rarely as needed.   He has allergies, follows with allergist for allergy shots, he is taking claritin,  using neti pot and nasal sprays.   Hx of GERD, known hiatal hernia per patient,  on omeprazole daily PM and famotidine PRN, was advised to reduce alcohol intake with improved sx previously. He admits has increased alcohol intake (2 glasses of wine every evening), has been having hoarseness and throat clearing, would like ENT evaluation.   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   BMI is Body mass index is 25.28 kg/m., he has  been working on diet and exercise though admits not as much, does walk daily. He does drink 2 alcoholic drinks nightly, up from prior to the pandemic, discussed reducing to <7/week.   Wt Readings from Last 3 Encounters:   05/24/21 186 lb 6.4 oz (84.6 kg)  11/16/20 181 lb (82.1 kg)  06/29/20 182 lb (82.6 kg)    His blood pressure has been controlled at home (110s-120s/60s), he is taking 20 mg olmesartan, today their BP is BP: 132/74.  He does workout. He denies chest pain, shortness of breath, dizziness.  He had negative at home screening sleep study with dentist.  He is on cholesterol medication, atorvastatin 10 mg daily and denies myalgias. His cholesterol is at goal. The cholesterol last visit was:   Lab Results  Component Value Date   CHOL 147 11/16/2020   HDL 62 11/16/2020   LDLCALC 71 11/16/2020   TRIG 49 11/16/2020   CHOLHDL 2.4 11/16/2020    Last A1C in the office was:  Lab Results  Component Value Date   HGBA1C 5.2 05/18/2020   He has a history of testosterone deficiency and is on testosterone replacement (natesto topical- he reports does forget to take occasionally). He states that the testosterone helps with his energy, libido, muscle mass. He has ED, back on levitra 20 mg which seemed to work better for him.  Lab Results  Component Value Date   TESTOSTERONE 438 05/18/2020    He does have ongoing dribbling (life long) and the stream has gotten mildly slower, denies sense of incomplete emptying.  Lab Results  Component Value Date   PSA 0.88 05/18/2020   PSA 0.8 05/06/2019   PSA 1.1 02/14/2016   Patient is on Vitamin D supplement.   Lab Results  Component Value Date   VD25OH 46 05/18/2020   He admits not regularly taking supplement -  Lab Results  Component Value Date   VITAMINB12 390 05/18/2020     Current Medications:  Current Outpatient Medications on File Prior to Visit  Medication Sig Dispense Refill   albuterol (VENTOLIN HFA) 108 (90 BASE) MCG/ACT inhaler Inhale 2 puffs into the lungs every 4 (four) hours as needed for wheezing or shortness of breath. 1 Inhaler 3   ALPRAZolam (XANAX) 0.5 MG tablet Take 1 tablet (0.5 mg total) by mouth 3 (three) times daily as needed  for sleep or anxiety. 30 tablet 0   atorvastatin (LIPITOR) 10 MG tablet TAKE 1 TABLET ONCE DAILY FOR CHOLESTEROL. 90 tablet 3   diclofenac sodium (VOLTAREN) 1 % GEL 3 g to 3 large joints three times daily 3 Tube 3   famotidine (PEPCID) 40 MG tablet TAKE 1 TABLET AT BEDTIME FOR ACID REFLUX AND INDIGESTION. 90 tablet 3   levocetirizine (XYZAL) 5 MG tablet Take 5 mg by mouth daily.     MAGNESIUM PO Take by mouth daily.     meloxicam (MOBIC) 15 MG tablet Take 1/2 to 1 tablet  Daily  with Food for Pain & Inflammation & try limit to 5 days/week to Avoid Kidney Damage 90 tablet 1   olmesartan (BENICAR) 20 MG tablet Take  1 tablet  Daily  for BP 90 tablet 3   Omega-3 Fatty Acids (FISH OIL PO) Take by mouth daily.     omeprazole (PRILOSEC) 40 MG capsule Take 1 capsule (40 mg total) by mouth daily as needed. 30 capsule 3   Testosterone (NATESTO) 5.5 MG/ACT GEL Place 5.5 mg into the nose 3 (three) times daily. 21.96  g 5   triamcinolone cream (KENALOG) 0.1 % Apply 1 application topically 2 (two) times daily. 45 g 1   vardenafil (LEVITRA) 20 MG tablet Take 1 tablet Daily if Needed for XXXX                                            /                    TAKE ONE TABLET BY MOUTH 30 tablet 1   No current facility-administered medications on file prior to visit.   Health Maintenance:  Immunization History  Administered Date(s) Administered   Influenza Inj Mdck Quad Pf 04/24/2017   Influenza Inj Mdck Quad With Preservative 02/26/2018, 05/04/2020   Influenza Split 03/28/2013, 03/30/2014   Influenza, Seasonal, Injecte, Preservative Fre 05/04/2015   PFIZER Comirnaty(Gray Top)Covid-19 Tri-Sucrose Vaccine 03/21/2021   PFIZER(Purple Top)SARS-COV-2 Vaccination 06/24/2019, 07/15/2019, 06/10/2020   Tdap 02/20/2017   Tetanus: 2018 Prevnar 20: age 35 Flu vaccine: 2022 Shingrix: - discussed, will get at pharmacy  Covid 19: 2/2, 2021, pfizer   Colonoscopy: 02/23/2015 due 10 years EGD:  Last eye: 2022, annually,  glasses, had ptosis  Last dental: 2022, 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  Last derm: 2022, derm, dx with SCC, pending skin surgery center eval  Medical History:  Patient Active Problem List   Diagnosis Date Noted   HLA B27 (HLA B27 positive) 11/16/2020   B12 deficiency 05/19/2020   Medication management 05/04/2019   Vitamin D deficiency 05/04/2019   History of basal cell carcinoma 02/09/2015   Psoriasis 02/09/2015   Testicular hypofunction 08/18/2014   DJD of shoulder 08/18/2014   Hypertension    Hyperlipidemia    Allergy    Asthma    Depression    GERD (gastroesophageal reflux disease)    ED (erectile dysfunction)    Plantar fasciitis 01/06/2012   Allergies Allergies  Allergen Reactions   Enalapril Cough    SURGICAL HISTORY He  has a past surgical history that includes Vasectomy (2013); orthopedic surgery (Left); Hernia repair (Bilateral, 1970); LASIK; scalp biopsy; and Ptosis repair (Bilateral, 11/2020). FAMILY HISTORY His family history includes Ankylosing spondylitis in his brother; Aortic stenosis in his father; Asthma in his father; Cancer in his paternal grandmother; Heart attack in his paternal grandfather; Heart disease in his brother; Kidney disease in his father; Rheum arthritis in his father; Uterine cancer in his mother. SOCIAL HISTORY He  reports that he has quit smoking. His smoking use included cigars. He has never used smokeless tobacco. He reports current alcohol use of about 14.0 standard drinks per week. He reports that he does not use drugs.   Review of Systems  Constitutional:  Negative for malaise/fatigue and weight loss.  HENT:  Positive for congestion. Negative for hearing loss, sore throat and tinnitus.        Hoarseness, AM throat clearing  Eyes:  Negative for blurred vision and double vision.  Respiratory:  Negative for cough, shortness of breath and wheezing.   Cardiovascular:  Negative for chest pain, palpitations, orthopnea, claudication and leg swelling.   Gastrointestinal:  Negative for abdominal pain, blood in stool, constipation, diarrhea, heartburn, melena, nausea and vomiting.  Genitourinary: Negative.   Musculoskeletal:  Negative for joint pain (intermittent R hip, working with massage) and myalgias.  Skin:  Negative for rash.  Neurological:  Negative for dizziness, tingling, sensory change,  weakness and headaches.  Endo/Heme/Allergies:  Negative for polydipsia.  Psychiatric/Behavioral:  Negative for depression, memory loss and suicidal ideas. The patient is nervous/anxious (intermittent). The patient does not have insomnia.   All other systems reviewed and are negative.  Physical Exam: Estimated body mass index is 25.28 kg/m as calculated from the following:   Height as of this encounter: 6' (1.829 m).   Weight as of this encounter: 186 lb 6.4 oz (84.6 kg). BP 132/74   Pulse 74   Temp (!) 97.3 F (36.3 C)   Ht 6' (1.829 m)   Wt 186 lb 6.4 oz (84.6 kg)   SpO2 99%   BMI 25.28 kg/m  General Appearance: Well nourished, in no apparent distress. Eyes: PERRLA, EOMs, conjunctiva no swelling or erythema. Mild left lid ptosis.  Sinuses: No Frontal/maxillary tenderness ENT/Mouth: Ext aud canals clear, normal light reflex with TMs without erythema, bulging. Good dentition. No erythema, swelling, or exudate on post pharynx. Tonsils not swollen or erythematous. Hearing normal.  Neck: Supple, thyroid normal. No bruits Respiratory: Respiratory effort normal, BS equal bilaterally without rales, rhonchi, wheezing or stridor. Cardio: RRR without murmurs, rubs or gallops. Brisk peripheral pulses without edema.  Chest: symmetric, with normal excursions and percussion. Abdomen: Soft, +BS. Non tender, no guarding, rebound, hernias, masses, or organomegaly.  Lymphatics: Non tender without lymphadenopathy.  Genitourinary: defer Musculoskeletal: Full ROM all peripheral extremities,5/5 strength, and normal gait. Mildly tender R medial epicondyle  without erythema or swelling. Wrist full ROM without crepitus, bony enlargement, neg phalen's, intact strength and distal sensation Skin: Warm, dry without rashes, lesions, ecchymosis.  Neuro: Cranial nerves intact, reflexes equal bilaterally. Normal muscle tone, no cerebellar symptoms. Sensation intact.  Psych: Awake and oriented X 3, normal affect, Insight and Judgment appropriate.   EKG: Sinus rhythm, WNL  Gorden Harms Demontray Franta 11:42 AM

## 2021-05-24 ENCOUNTER — Encounter: Payer: Self-pay | Admitting: Adult Health

## 2021-05-24 ENCOUNTER — Ambulatory Visit (INDEPENDENT_AMBULATORY_CARE_PROVIDER_SITE_OTHER): Payer: 59 | Admitting: Adult Health

## 2021-05-24 ENCOUNTER — Other Ambulatory Visit: Payer: Self-pay

## 2021-05-24 VITALS — BP 132/74 | HR 74 | Temp 97.3°F | Ht 72.0 in | Wt 186.4 lb

## 2021-05-24 DIAGNOSIS — L409 Psoriasis, unspecified: Secondary | ICD-10-CM

## 2021-05-24 DIAGNOSIS — E538 Deficiency of other specified B group vitamins: Secondary | ICD-10-CM

## 2021-05-24 DIAGNOSIS — E559 Vitamin D deficiency, unspecified: Secondary | ICD-10-CM

## 2021-05-24 DIAGNOSIS — Z85828 Personal history of other malignant neoplasm of skin: Secondary | ICD-10-CM

## 2021-05-24 DIAGNOSIS — Z131 Encounter for screening for diabetes mellitus: Secondary | ICD-10-CM

## 2021-05-24 DIAGNOSIS — R0989 Other specified symptoms and signs involving the circulatory and respiratory systems: Secondary | ICD-10-CM

## 2021-05-24 DIAGNOSIS — Z125 Encounter for screening for malignant neoplasm of prostate: Secondary | ICD-10-CM

## 2021-05-24 DIAGNOSIS — F3342 Major depressive disorder, recurrent, in full remission: Secondary | ICD-10-CM

## 2021-05-24 DIAGNOSIS — Z1389 Encounter for screening for other disorder: Secondary | ICD-10-CM

## 2021-05-24 DIAGNOSIS — E291 Testicular hypofunction: Secondary | ICD-10-CM

## 2021-05-24 DIAGNOSIS — N529 Male erectile dysfunction, unspecified: Secondary | ICD-10-CM

## 2021-05-24 DIAGNOSIS — Z136 Encounter for screening for cardiovascular disorders: Secondary | ICD-10-CM | POA: Diagnosis not present

## 2021-05-24 DIAGNOSIS — E785 Hyperlipidemia, unspecified: Secondary | ICD-10-CM

## 2021-05-24 DIAGNOSIS — Z0000 Encounter for general adult medical examination without abnormal findings: Secondary | ICD-10-CM

## 2021-05-24 DIAGNOSIS — I10 Essential (primary) hypertension: Secondary | ICD-10-CM

## 2021-05-24 DIAGNOSIS — K2100 Gastro-esophageal reflux disease with esophagitis, without bleeding: Secondary | ICD-10-CM

## 2021-05-24 DIAGNOSIS — Z1329 Encounter for screening for other suspected endocrine disorder: Secondary | ICD-10-CM

## 2021-05-24 DIAGNOSIS — Z79899 Other long term (current) drug therapy: Secondary | ICD-10-CM

## 2021-05-24 DIAGNOSIS — Z0001 Encounter for general adult medical examination with abnormal findings: Secondary | ICD-10-CM

## 2021-05-24 DIAGNOSIS — J45909 Unspecified asthma, uncomplicated: Secondary | ICD-10-CM

## 2021-05-24 NOTE — Patient Instructions (Addendum)
Stephen Campbell , Thank you for taking time to come for your Annual Wellness Visit. I appreciate your ongoing commitment to your health goals. Please review the following plan we discussed and let me know if I can assist you in the future.   These are the goals we discussed:  Goals      Reduce alcohol intake        This is a list of the screening recommended for you and due dates:  Health Maintenance  Topic Date Due   Zoster (Shingles) Vaccine (1 of 2) Never done   COVID-19 Vaccine (6 - Booster for Pfizer series) 05/16/2021   Pneumococcal Vaccination (1 - PCV) 05/25/2027*   Colon Cancer Screening  02/22/2025   Tetanus Vaccine  02/21/2027   Flu Shot  Completed   Hepatitis C Screening: USPSTF Recommendation to screen - Ages 18-79 yo.  Completed   HIV Screening  Completed   HPV Vaccine  Aged Out  *Topic was postponed. The date shown is not the original due date.      Know what a healthy weight is for you (roughly BMI <25) and aim to maintain this  Aim for 7+ servings of fruits and vegetables daily  65-80+ fluid ounces of water or unsweet tea for healthy kidneys  Limit to max 1 drink of alcohol per day; avoid smoking/tobacco  Limit animal fats in diet for cholesterol and heart health - choose grass fed whenever available  Avoid highly processed foods, and foods high in saturated/trans fats  Aim for low stress - take time to unwind and care for your mental health  Aim for 150 min of moderate intensity exercise weekly for heart health, and weights twice weekly for bone health  Aim for 7-9 hours of sleep daily     Gastroesophageal Reflux Disease, Adult Gastroesophageal reflux (GER) happens when acid from the stomach flows up into the tube that connects the mouth and the stomach (esophagus). Normally, food travels down the esophagus and stays in the stomach to be digested. However, when a person has GER, food and stomach acid sometimes move back up into the esophagus. If this  becomes a more serious problem, the person may be diagnosed with a disease called gastroesophageal reflux disease (GERD). GERD occurs when the reflux: Happens often. Causes frequent or severe symptoms. Causes problems such as damage to the esophagus. When stomach acid comes in contact with the esophagus, the acid may cause inflammation in the esophagus. Over time, GERD may create small holes (ulcers) in the lining of the esophagus. What are the causes? This condition is caused by a problem with the muscle between the esophagus and the stomach (lower esophageal sphincter, or LES). Normally, the LES muscle closes after food passes through the esophagus to the stomach. When the LES is weakened or abnormal, it does not close properly, and that allows food and stomach acid to go back up into the esophagus. The LES can be weakened by certain dietary substances, medicines, and medical conditions, including: Tobacco use. Pregnancy. Having a hiatal hernia. Alcohol use. Certain foods and beverages, such as coffee, chocolate, onions, and peppermint. What increases the risk? You are more likely to develop this condition if you: Have an increased body weight. Have a connective tissue disorder. Take NSAIDs, such as ibuprofen. What are the signs or symptoms? Symptoms of this condition include: Heartburn. Difficult or painful swallowing and the feeling of having a lump in the throat. A bitter taste in the mouth. Bad breath and  having a large amount of saliva. Having an upset or bloated stomach and belching. Chest pain. Different conditions can cause chest pain. Make sure you see your health care provider if you experience chest pain. Shortness of breath or wheezing. Ongoing (chronic) cough or a nighttime cough. Wearing away of tooth enamel. Weight loss. How is this diagnosed? This condition may be diagnosed based on a medical history and a physical exam. To determine if you have mild or severe GERD,  your health care provider may also monitor how you respond to treatment. You may also have tests, including: A test to examine your stomach and esophagus with a small camera (endoscopy). A test that measures the acidity level in your esophagus. A test that measures how much pressure is on your esophagus. A barium swallow or modified barium swallow test to show the shape, size, and functioning of your esophagus. How is this treated? Treatment for this condition may vary depending on how severe your symptoms are. Your health care provider may recommend: Changes to your diet. Medicine. Surgery. The goal of treatment is to help relieve your symptoms and to prevent complications. Follow these instructions at home: Eating and drinking  Follow a diet as recommended by your health care provider. This may involve avoiding foods and drinks such as: Coffee and tea, with or without caffeine. Drinks that contain alcohol. Energy drinks and sports drinks. Carbonated drinks or sodas. Chocolate and cocoa. Peppermint and mint flavorings. Garlic and onions. Horseradish. Spicy and acidic foods, including peppers, chili powder, curry powder, vinegar, hot sauces, and barbecue sauce. Citrus fruit juices and citrus fruits, such as oranges, lemons, and limes. Tomato-based foods, such as red sauce, chili, salsa, and pizza with red sauce. Fried and fatty foods, such as donuts, french fries, potato chips, and high-fat dressings. High-fat meats, such as hot dogs and fatty cuts of red and white meats, such as rib eye steak, sausage, ham, and bacon. High-fat dairy items, such as whole milk, butter, and cream cheese. Eat small, frequent meals instead of large meals. Avoid drinking large amounts of liquid with your meals. Avoid eating meals during the 2-3 hours before bedtime. Avoid lying down right after you eat. Do not exercise right after you eat. Lifestyle  Do not use any products that contain nicotine or  tobacco. These products include cigarettes, chewing tobacco, and vaping devices, such as e-cigarettes. If you need help quitting, ask your health care provider. Try to reduce your stress by using methods such as yoga or meditation. If you need help reducing stress, ask your health care provider. If you are overweight, reduce your weight to an amount that is healthy for you. Ask your health care provider for guidance about a safe weight loss goal. General instructions Pay attention to any changes in your symptoms. Take over-the-counter and prescription medicines only as told by your health care provider. Do not take aspirin, ibuprofen, or other NSAIDs unless your health care provider told you to take these medicines. Wear loose-fitting clothing. Do not wear anything tight around your waist that causes pressure on your abdomen. Raise (elevate) the head of your bed about 6 inches (15 cm). You can use a wedge to do this. Avoid bending over if this makes your symptoms worse. Keep all follow-up visits. This is important. Contact a health care provider if: You have: New symptoms. Unexplained weight loss. Difficulty swallowing or it hurts to swallow. Wheezing or a persistent cough. A hoarse voice. Your symptoms do not improve with treatment.  Get help right away if: You have sudden pain in your arms, neck, jaw, teeth, or back. You suddenly feel sweaty, dizzy, or light-headed. You have chest pain or shortness of breath. You vomit and the vomit is green, yellow, or black, or it looks like blood or coffee grounds. You faint. You have stool that is red, bloody, or black. You cannot swallow, drink, or eat. These symptoms may represent a serious problem that is an emergency. Do not wait to see if the symptoms will go away. Get medical help right away. Call your local emergency services (911 in the [place] Do not drive yourself to the hospital. Summary Gastroesophageal reflux happens when acid from the  stomach flows up into the esophagus. GERD is a disease in which the reflux happens often, causes frequent or severe symptoms, or causes problems such as damage to the esophagus. Treatment for this condition may vary depending on how severe your symptoms are. Your health care provider may recommend diet and lifestyle changes, medicine, or surgery. Contact a health care provider if you have new or worsening symptoms. Take over-the-counter and prescription medicines only as told by your health care provider. Do not take aspirin, ibuprofen, or other NSAIDs unless your health care provider told you to do so. Keep all follow-up visits as told by your health care provider. This is important. This information is not intended to replace advice given to you by your health care provider. Make sure you discuss any questions you have with your health care provider. Document Revised: 12/12/2019 Document Reviewed: 12/12/2019 Elsevier Patient Education  [employer].

## 2021-05-25 LAB — PSA: PSA: 0.77 ng/mL (ref <=4.00)

## 2021-05-25 LAB — COMPLETE METABOLIC PANEL WITH GFR
AG Ratio: 1.6 (calc) (ref 1.0–2.5)
ALT: 45 U/L (ref 9–46)
AST: 31 U/L (ref 10–35)
Albumin: 4.6 g/dL (ref 3.6–5.1)
Alkaline phosphatase (APISO): 42 U/L (ref 35–144)
BUN: 16 mg/dL (ref 7–25)
CO2: 27 mmol/L (ref 20–32)
Calcium: 9.8 mg/dL (ref 8.6–10.3)
Chloride: 103 mmol/L (ref 98–110)
Creat: 1.03 mg/dL (ref 0.70–1.30)
Globulin: 2.8 g/dL (calc) (ref 1.9–3.7)
Glucose, Bld: 103 mg/dL — ABNORMAL HIGH (ref 65–99)
Potassium: 4.2 mmol/L (ref 3.5–5.3)
Sodium: 139 mmol/L (ref 135–146)
Total Bilirubin: 1 mg/dL (ref 0.2–1.2)
Total Protein: 7.4 g/dL (ref 6.1–8.1)
eGFR: 84 mL/min/{1.73_m2} (ref >=60)

## 2021-05-25 LAB — CBC WITH DIFFERENTIAL/PLATELET
Absolute Monocytes: 475 cells/uL (ref 200–950)
Basophils Absolute: 49 cells/uL (ref 0–200)
Basophils Relative: 0.9 %
Eosinophils Absolute: 221 cells/uL (ref 15–500)
Eosinophils Relative: 4.1 %
HCT: 44.4 % (ref 38.5–50.0)
Hemoglobin: 14.8 g/dL (ref 13.2–17.1)
Lymphs Abs: 1404 cells/uL (ref 850–3900)
MCH: 31.5 pg (ref 27.0–33.0)
MCHC: 33.3 g/dL (ref 32.0–36.0)
MCV: 94.5 fL (ref 80.0–100.0)
MPV: 11.5 fL (ref 7.5–12.5)
Monocytes Relative: 8.8 %
Neutro Abs: 3251 cells/uL (ref 1500–7800)
Neutrophils Relative %: 60.2 %
Platelets: 197 10*3/uL (ref 140–400)
RBC: 4.7 10*6/uL (ref 4.20–5.80)
RDW: 11.9 % (ref 11.0–15.0)
Total Lymphocyte: 26 %
WBC: 5.4 10*3/uL (ref 3.8–10.8)

## 2021-05-25 LAB — URINALYSIS, ROUTINE W REFLEX MICROSCOPIC
Bilirubin Urine: NEGATIVE
Glucose, UA: NEGATIVE
Hgb urine dipstick: NEGATIVE
Ketones, ur: NEGATIVE
Leukocytes,Ua: NEGATIVE
Nitrite: NEGATIVE
Protein, ur: NEGATIVE
Specific Gravity, Urine: 1.014 (ref 1.001–1.035)
pH: 6.5 (ref 5.0–8.0)

## 2021-05-25 LAB — LIPID PANEL
Cholesterol: 162 mg/dL (ref <200)
HDL: 52 mg/dL (ref >=40)
LDL Cholesterol (Calc): 82 mg/dL (calc)
Non-HDL Cholesterol (Calc): 110 mg/dL (calc) (ref <130)
Total CHOL/HDL Ratio: 3.1 (calc) (ref <5.0)
Triglycerides: 179 mg/dL — ABNORMAL HIGH (ref <150)

## 2021-05-25 LAB — HEMOGLOBIN A1C
Hgb A1c MFr Bld: 5.5 % of total Hgb (ref <5.7)
Mean Plasma Glucose: 111 mg/dL
eAG (mmol/L): 6.2 mmol/L

## 2021-05-25 LAB — MICROALBUMIN / CREATININE URINE RATIO
Creatinine, Urine: 115 mg/dL (ref 20–320)
Microalb, Ur: <0.2 mg/dL

## 2021-05-25 LAB — MAGNESIUM: Magnesium: 1.8 mg/dL (ref 1.5–2.5)

## 2021-05-25 LAB — VITAMIN D 25 HYDROXY (VIT D DEFICIENCY, FRACTURES): Vit D, 25-Hydroxy: 36 ng/mL (ref 30–100)

## 2021-05-25 LAB — TESTOSTERONE: Testosterone: 499 ng/dL (ref 250–827)

## 2021-05-25 LAB — VITAMIN B12: Vitamin B-12: 311 pg/mL (ref 200–1100)

## 2021-05-25 LAB — TSH: TSH: 1.32 mIU/L (ref 0.40–4.50)

## 2021-05-29 ENCOUNTER — Other Ambulatory Visit: Payer: Self-pay | Admitting: Adult Health

## 2021-05-29 DIAGNOSIS — E291 Testicular hypofunction: Secondary | ICD-10-CM

## 2021-06-19 ENCOUNTER — Other Ambulatory Visit: Payer: Self-pay

## 2021-06-19 MED ORDER — OLMESARTAN MEDOXOMIL 20 MG PO TABS: ORAL_TABLET | ORAL | 3 refills | Status: DC

## 2021-06-19 MED ORDER — ATORVASTATIN CALCIUM 10 MG PO TABS: ORAL_TABLET | ORAL | 3 refills | Status: DC

## 2021-07-08 ENCOUNTER — Encounter: Payer: Self-pay | Admitting: Adult Health

## 2021-07-08 ENCOUNTER — Other Ambulatory Visit: Payer: Self-pay | Admitting: Internal Medicine

## 2021-07-08 MED ORDER — DOXYCYCLINE HYCLATE 100 MG PO CAPS: ORAL_CAPSULE | ORAL | 0 refills | Status: DC

## 2021-07-09 ENCOUNTER — Ambulatory Visit (INDEPENDENT_AMBULATORY_CARE_PROVIDER_SITE_OTHER): Payer: No Typology Code available for payment source | Admitting: Adult Health

## 2021-07-09 ENCOUNTER — Encounter: Payer: Self-pay | Admitting: Adult Health

## 2021-07-09 ENCOUNTER — Other Ambulatory Visit: Payer: Self-pay

## 2021-07-09 VITALS — BP 156/82 | HR 85 | Temp 97.5°F | Wt 187.0 lb

## 2021-07-09 DIAGNOSIS — R519 Headache, unspecified: Secondary | ICD-10-CM | POA: Diagnosis not present

## 2021-07-09 MED ORDER — PREDNISONE 50 MG PO TABS: ORAL_TABLET | ORAL | 0 refills | Status: DC

## 2021-07-09 NOTE — Progress Notes (Signed)
Assessment and Plan:  Stephen Campbell was seen today for migraine.  Diagnoses and all orders for this visit:  New onset headache Persistent new severe headache x 3 days, unclear etiology, no hx of migraine or other HA, ? Some elements suggest tension type headache but pain more severe than would expect and no improvement at all with tylenol/NSAID;  neuro exam is otherwise normal, without deficit suggestive of CVA no nuchal rigidity suggestive of meningitis ? Related to recent URI or dog bite but without notable fever/chills,  Discussed with Dr. Melford Aase; continue current abx; will check STAT labs as per below and try high dose steroid tonight, he will follow up in AM after second dose with progress, if persistent will plan neuro imaging Advised to go to the ER if any worsening headache, changes vision/speech, imbalance, weakness. -     CBC with Differential/Platelet -     C-reactive protein -     Sedimentation rate -     predniSONE (DELTASONE) 50 MG tablet; Take 1 tab daily for 5 days.  Further disposition pending results of labs. Discussed med's effects and SE's.   Over 30 minutes of exam, counseling, chart review, and critical decision making was performed.   Future Appointments  Date Time Provider [place]  11/29/2021  9:30 AM Liane Comber, NP [facility] None  05/27/2022  9:00 AM Liane Comber, NP [facility] None    ------------------------------------------------------------------------------------------------------------------   HPI BP (!) 156/82    Pulse 85    Temp (!) 97.5 F (36.4 C)    Wt 187 lb (84.8 kg)    SpO2 99%    BMI 25.36 kg/m  61 y.o.male with hx of htn/hld typically well controlled, + HLA B27 (monitoring only, no sx) presents for evaluation of headache x 3 days.   He reports was ill last week started 07/02/2021 with scratchy throat/sore throat with some loose stools that remained mild, felt mildly achy and chills but no fever, tested negative for covid 19  numerous times, sx resolved after 5-7 days and felt back to baseline.   Current headache began a day or two later.   He describes new headache that feels like "intense squeezing" pressure, tenderness all over scalp, has progressed up to 8-9/10, persistent since Monday and progressive despite alternating tylenol and ibuprofen with minimal benefit. Nothing resolves this. He reports has tried ice packs to head without benefit. Has been resting and pushing hydration.   He notes mild photosensitivity, nausea without emesis, denies blurry vision/double vision. Today he reports generalized mild achiness without specific arthralgia or myalgia. Denies neck pain/stiff neck. Denies word finding difficulty, slurring, unstable gait, numbness or weakness of extremities. Intermittently dizzy. Denies rash.   He does report his family pet dog's tooth caught his R pointer finger 5 days ago on 07/05/2021, developed redness and swelling at the tip, was getting worse despite soaks, wrapping, amoxicillin 500 mg with loading dose, Dr. Melford Aase sent in doxycycline 100 mg BID extended course which he started yesterday, hasn't noted change in HA since then. Finger is about the same/mildly improved.   Denies recent travel, atypical foods, known sick contacts. Wife doesn't have sx. No recent fall/trauma/MVC.    Past Medical History:  Diagnosis Date   Allergy    Anxiety    Asthma    Basal cell carcinoma    Depression    ED (erectile dysfunction)    GERD (gastroesophageal reflux disease)    Hyperlipidemia    Hypertension    Hypogonadism in male  Osteoarthritis    Vitamin D deficiency      Allergies  Allergen Reactions   Enalapril Cough    Current Outpatient Medications on File Prior to Visit  Medication Sig   albuterol (VENTOLIN HFA) 108 (90 BASE) MCG/ACT inhaler Inhale 2 puffs into the lungs every 4 (four) hours as needed for wheezing or shortness of breath.   ALPRAZolam (XANAX) 0.5 MG tablet Take 1 tablet  (0.5 mg total) by mouth 3 (three) times daily as needed for sleep or anxiety.   atorvastatin (LIPITOR) 10 MG tablet TAKE 1 TABLET ONCE DAILY FOR CHOLESTEROL.   diclofenac sodium (VOLTAREN) 1 % GEL 3 g to 3 large joints three times daily   doxycycline (VIBRAMYCIN) 100 MG capsule Take 1 capsule 2 x /day with meals for Infection   famotidine (PEPCID) 40 MG tablet TAKE 1 TABLET AT BEDTIME FOR ACID REFLUX AND INDIGESTION.   levocetirizine (XYZAL) 5 MG tablet Take 5 mg by mouth daily.   MAGNESIUM PO Take by mouth daily.   meloxicam (MOBIC) 15 MG tablet Take 1/2 to 1 tablet  Daily  with Food for Pain & Inflammation & try limit to 5 days/week to Avoid Kidney Damage   NATESTO 5.5 MG/ACT GEL use 1 pump in each nostril three times daily *wash hands after use   olmesartan (BENICAR) 20 MG tablet Take  1 tablet  Daily  for BP   Omega-3 Fatty Acids (FISH OIL PO) Take by mouth daily.   omeprazole (PRILOSEC) 40 MG capsule Take 1 capsule (40 mg total) by mouth daily as needed.   triamcinolone cream (KENALOG) 0.1 % Apply 1 application topically 2 (two) times daily.   vardenafil (LEVITRA) 20 MG tablet Take 1 tablet Daily if Needed for XXXX                                            /                    TAKE ONE TABLET BY MOUTH   No current facility-administered medications on file prior to visit.   Allergies:  Allergies  Allergen Reactions   Enalapril Cough   Surgical History:  He  has a past surgical history that includes Vasectomy (2013); orthopedic surgery (Left); Hernia repair (Bilateral, 1970); LASIK; scalp biopsy; and Ptosis repair (Bilateral, 11/2020). Family History:  Hisfamily history includes Ankylosing spondylitis in his brother; Aortic stenosis in his father; Asthma in his father; Cancer in his paternal grandmother; Heart attack in his paternal grandfather; Heart disease in his brother; Kidney disease in his father; Rheum arthritis in his father; Uterine cancer in his mother. Social History:    reports that he has quit smoking. His smoking use included cigars. He has never used smokeless tobacco. He reports current alcohol use of about 14.0 standard drinks per week. He reports that he does not use drugs.  ROS: all negative except above.   Physical Exam:  BP (!) 156/82    Pulse 85    Temp (!) 97.5 F (36.4 C)    Wt 187 lb (84.8 kg)    SpO2 99%    BMI 25.36 kg/m   General Appearance: Well nourished, well dressed adult male, non-toxic, appears uncomfortable but in in no acute or severe distress. Eyes: PERRLA, EOMs, conjunctiva no swelling or erythema Sinuses: No Frontal/maxillary tenderness ENT/Mouth: Ext aud canals clear,  TMs without erythema, bulging. No erythema, swelling, or exudate on post pharynx.  Tonsils not swollen or erythematous. Hearing normal.  Neck: Supple, chin to chest without difficulty.  Respiratory: Respiratory effort normal, BS equal bilaterally without rales, rhonchi, wheezing or stridor.  Cardio: RRR with no MRGs. Brisk peripheral pulses without edema.  Abdomen: Soft, + BS.  Non tender.  Lymphatics: Non tender without lymphadenopathy.  Musculoskeletal: Full ROM, 5/5 strength upper and lower extremities throughout, normal gait. Tight neck muscle symmetrically without spasm.  Skin: Warm, dry without rashes, lesions, ecchymosis. R pointer with mild erythema to soft tissue without purulent discharge or fluctuance. No redness extending up digit or extremity. No localizing edema to joint.  Neuro: Cranial nerves intact. Normal muscle tone, no cerebellar symptoms. Pronator drift negative. Normal rapid alternating, heel down shin. Sensation intact.  Psych: Awake and oriented X 3, normal affect, Insight and Judgment appropriate.     Izora Ribas, NP 4:07 PM [facility]

## 2021-07-09 NOTE — Patient Instructions (Signed)
General Headache Without Cause A headache is pain or discomfort felt around the head or neck area. There are many causes and types of headaches. A few common types include: Tension headaches. Migraine headaches. Cluster headaches. Chronic daily headaches. Sometimes, the specific cause of a headache may not be found. Follow these instructions at home: Watch your condition for any changes. Let your health care provider know about them. Take these steps to help with your condition: Managing pain   Take over-the-counter and prescription medicines only as told by your health care provider. Treatment may include medicines for pain that are taken by mouth or applied to the skin. Lie down in a dark, quiet room when you have a headache. Keep lights dim if bright lights bother you or make your headaches worse. If directed, put ice on your head and neck area: Put ice in a plastic bag. Place a towel between your skin and the bag. Leave the ice on for 20 minutes, 2-3 times per day. Remove the ice if your skin turns bright red. This is very important. If you cannot feel pain, heat, or cold, you have a greater risk of damage to the area. If directed, apply heat to the affected area. Use the heat source that your health care provider recommends, such as a moist heat pack or a heating pad. Place a towel between your skin and the heat source. Leave the heat on for 20-30 minutes. Remove the heat if your skin turns bright red. This is especially important if you are unable to feel pain, heat, or cold. You have a greater risk of getting burned. Eating and drinking Eat meals on a regular schedule. If you drink alcohol: Limit how much you have to: 0-1 drink a day for women who are not pregnant. 0-2 drinks a day for men. Know how much alcohol is in a drink. In the [place] one drink equals one 12 oz bottle of beer (355 mL), one 5 oz glass of wine (148 mL), or one 1 oz glass of hard liquor (44 mL). Stop drinking  caffeine, or decrease the amount of caffeine you drink. Drink enough fluid to keep your urine pale yellow. General instructions  Keep a headache journal to help find out what may trigger your headaches. For example, write down: What you eat and drink. How much sleep you get. Any change to your diet or medicines. Try massage or other relaxation techniques. Limit stress. Sit up straight, and do not tense your muscles. Do not use any products that contain nicotine or tobacco. These products include cigarettes, chewing tobacco, and vaping devices, such as e-cigarettes. If you need help quitting, ask your health care provider. Exercise regularly as told by your health care provider. Sleep on a regular schedule. Get 7-9 hours of sleep each night, or the amount recommended by your health care provider. Keep all follow-up visits. This is important. Contact a health care provider if: Medicine does not help your symptoms. You have a headache that is different from your usual headache. You have nausea or you vomit. You have a fever. Get help right away if: Your headache: Becomes severe quickly. Gets worse after moderate to intense physical activity. You have any of these symptoms: Repeated vomiting. Pain or stiffness in your neck. Changes to your vision. Pain in an eye or ear. Problems with speech. Muscular weakness or loss of muscle control. Loss of balance or coordination. You feel faint or pass out. You have confusion. You have a seizure.  These symptoms may represent a serious problem that is an emergency. Do not wait to see if the symptoms will go away. Get medical help right away. Call your local emergency services (911 in the [place] Do not drive yourself to the hospital. Summary A headache is pain or discomfort felt around the head or neck area. There are many causes and types of headaches. In some cases, the cause may not be found. Keep a headache journal to help find out what may  trigger your headaches. Watch your condition for any changes. Let your health care provider know about them. Contact a health care provider if you have a headache that is different from the usual headache, or if your symptoms are not helped by medicine. Get help right away if your headache becomes severe, you vomit, you have a loss of vision, you lose your balance, or you have a seizure. This information is not intended to replace advice given to you by your health care provider. Make sure you discuss any questions you have with your health care provider. Document Revised: 10/31/2020 Document Reviewed: 10/31/2020 Elsevier Patient Education  [place].

## 2021-07-10 ENCOUNTER — Ambulatory Visit (HOSPITAL_COMMUNITY)
Admission: RE | Admit: 2021-07-10 | Discharge: 2021-07-10 | Disposition: A | Discharge status: Home / Self Care | Payer: 59 | Source: Ambulatory Visit | Attending: Adult Health | Admitting: Adult Health

## 2021-07-10 ENCOUNTER — Other Ambulatory Visit: Payer: Self-pay | Admitting: Adult Health

## 2021-07-10 ENCOUNTER — Other Ambulatory Visit: Payer: Self-pay | Admitting: Physician Assistant

## 2021-07-10 ENCOUNTER — Encounter: Payer: Self-pay | Admitting: Adult Health

## 2021-07-10 DIAGNOSIS — R7982 Elevated C-reactive protein (CRP): Secondary | ICD-10-CM | POA: Diagnosis present

## 2021-07-10 DIAGNOSIS — R519 Headache, unspecified: Secondary | ICD-10-CM

## 2021-07-10 LAB — CBC WITH DIFFERENTIAL/PLATELET
Absolute Monocytes: 1076 cells/uL — ABNORMAL HIGH (ref 200–950)
Basophils Absolute: 35 cells/uL (ref 0–200)
Basophils Relative: 0.3 %
Eosinophils Absolute: 12 cells/uL — ABNORMAL LOW (ref 15–500)
Eosinophils Relative: 0.1 %
HCT: 37.7 % — ABNORMAL LOW (ref 38.5–50.0)
Hemoglobin: 13 g/dL — ABNORMAL LOW (ref 13.2–17.1)
Lymphs Abs: 1041 cells/uL (ref 850–3900)
MCH: 31.9 pg (ref 27.0–33.0)
MCHC: 34.5 g/dL (ref 32.0–36.0)
MCV: 92.6 fL (ref 80.0–100.0)
MPV: 10.8 fL (ref 7.5–12.5)
Monocytes Relative: 9.2 %
Neutro Abs: 9536 cells/uL — ABNORMAL HIGH (ref 1500–7800)
Neutrophils Relative %: 81.5 %
Platelets: 232 10*3/uL (ref 140–400)
RBC: 4.07 10*6/uL — ABNORMAL LOW (ref 4.20–5.80)
RDW: 11.7 % (ref 11.0–15.0)
Total Lymphocyte: 8.9 %
WBC: 11.7 10*3/uL — ABNORMAL HIGH (ref 3.8–10.8)

## 2021-07-10 LAB — C-REACTIVE PROTEIN: CRP: 76.4 mg/L — ABNORMAL HIGH (ref <8.0)

## 2021-07-10 LAB — SEDIMENTATION RATE: Sed Rate: 55 mm/h — ABNORMAL HIGH (ref 0–20)

## 2021-07-10 IMAGING — MR MR HEAD WO/W CM
13 series · 48 of 48 positions shown · IV contrast (gadavist)
Comparison: Head CT [DATE]

CLINICAL DATA: Headache, new or worsening (Age >= 50y). 2 days of
bilateral severe progressive headache with elevated CRP.

EXAM:
MRI HEAD WITHOUT AND WITH CONTRAST
TECHNIQUE: Multiplanar, multiecho pulse sequences of the brain and surrounding
structures were obtained without and with intravenous contrast.
CONTRAST:  8mL GADAVIST GADOBUTROL 1 MMOL/ML IV SOLN

[Series 5: DWI · axial · 3.0mm · 1.36mm/px · z∈[-37,+114]mm · 6 of 104 slices shown (1 of 2)]
[im 1/104]
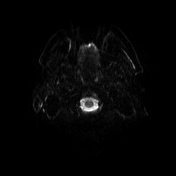
[im 21/104]
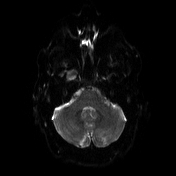
[im 42/104]
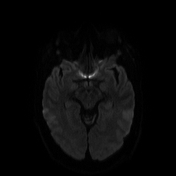
[im 62/104]
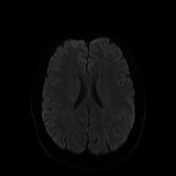
[im 83/104]
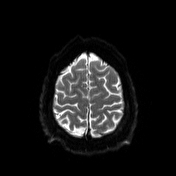
[im 104/104]
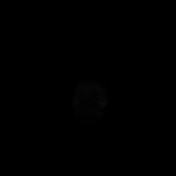

[Series 6: DWI · axial · 3.0mm · 1.36mm/px · z∈[-37,+114]mm · 3 of 52 slices shown (2 of 2)]
[im 1/52]
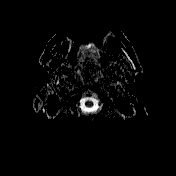
[im 26/52]
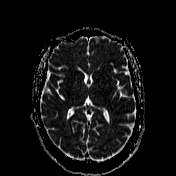
[im 52/52]
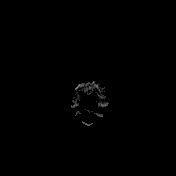

[Series 7: T1 · sagittal · 5.0mm · 0.81mm/px · 1 of 25 slices shown (1 of 2)]
[im 1/25]
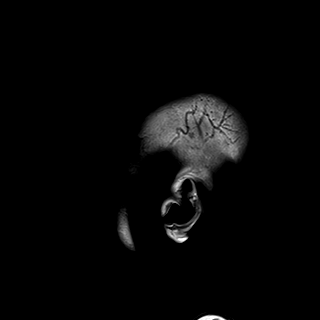

[Series 8: T2 · axial · 5.0mm · 0.62mm/px · z∈[-49,+112]mm · 2 of 26 slices shown]
[im 1/26]
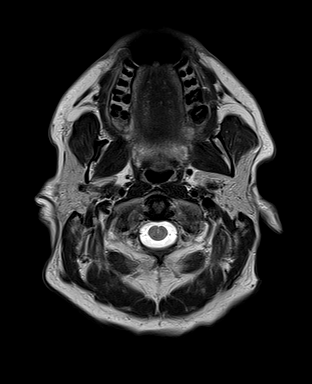
[im 26/26]
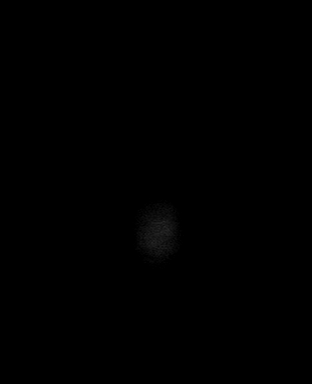

[Series 9: swi_images · axial · 3.0mm · 0.75mm/px · z∈[-51,+114]mm · 3 of 56 slices shown]
[im 1/56]
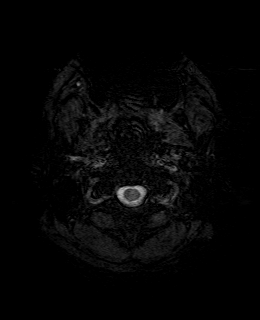
[im 28/56]
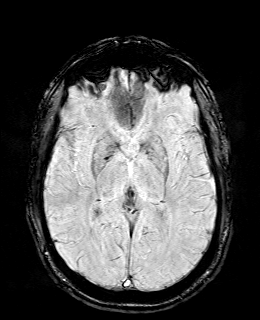
[im 56/56]
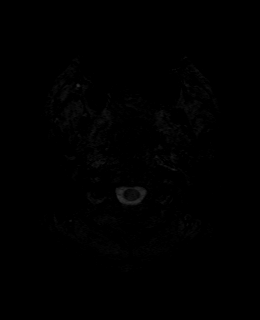

[Series 11: FLAIR · axial · 3.0mm · 0.75mm/px · z∈[-45,+108]mm · 3 of 52 slices shown]
[im 1/52]
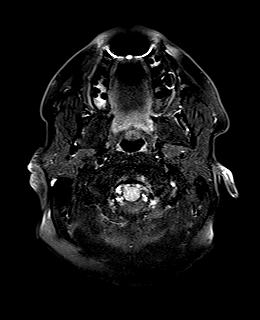
[im 26/52]
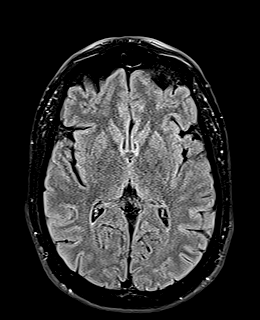
[im 52/52]
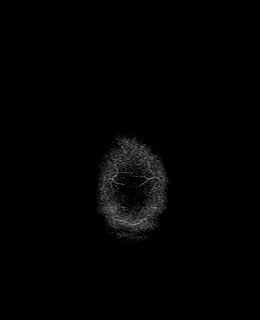

[Series 12: T1 · axial · 1.0mm · 0.94mm/px · z∈[-49,+109]mm · 10 of 160 slices shown (2 of 2)]
[im 1/160]
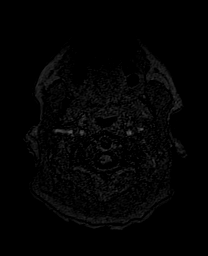
[im 18/160]
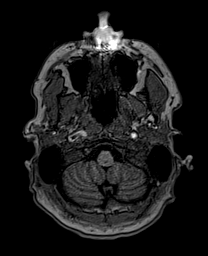
[im 36/160]
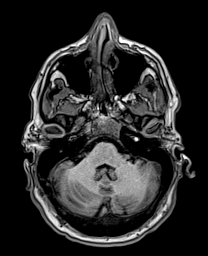
[im 54/160]
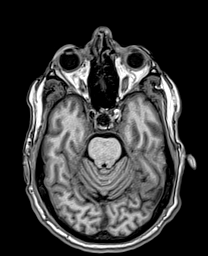
[im 71/160]
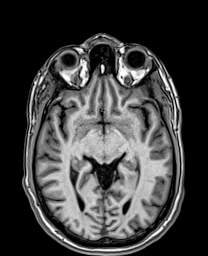
[im 89/160]
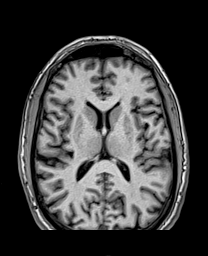
[im 107/160]
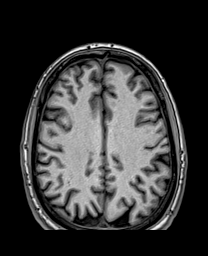
[im 124/160]
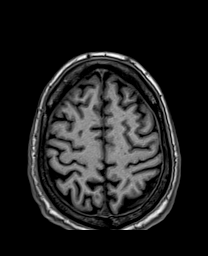
[im 142/160]
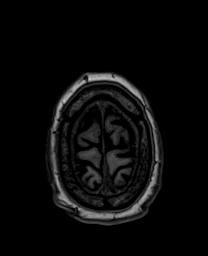
[im 160/160]
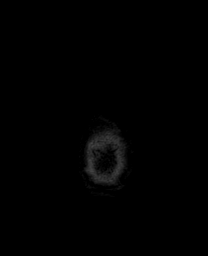

[Series 13: cor dwi_tracew · coronal · 5.0mm · 1.53mm/px · 3 of 58 slices shown]
[im 1/58]
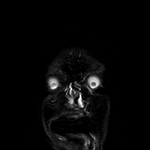
[im 29/58]
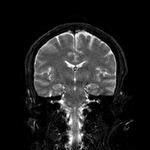
[im 58/58]
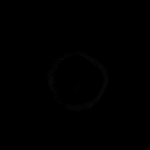

[Series 14: cor dwi_adc · coronal · 5.0mm · 1.53mm/px · 2 of 29 slices shown]
[im 1/29]
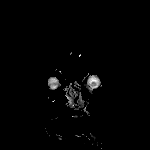
[im 29/29]
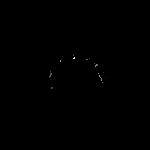

[Series 15: T2 post-contrast · coronal · 5.0mm · 0.69mm/px · 2 of 29 slices shown]
[im 1/29]
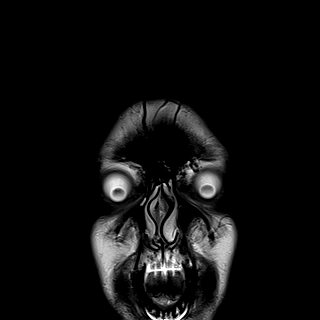
[im 29/29]
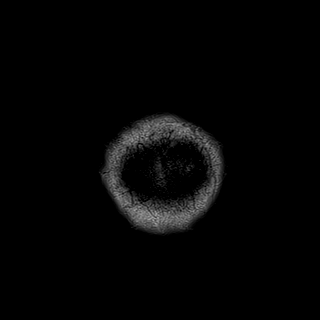

[Series 16: T1 post-contrast · axial · 1.0mm · 0.94mm/px · z∈[-49,+109]mm · 10 of 160 slices shown (1 of 3)]
[im 1/160]
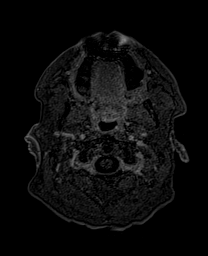
[im 18/160]
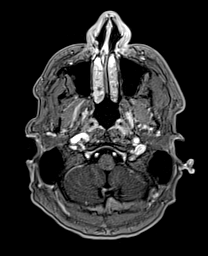
[im 36/160]
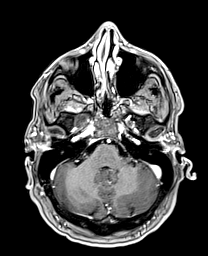
[im 54/160]
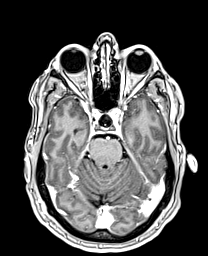
[im 71/160]
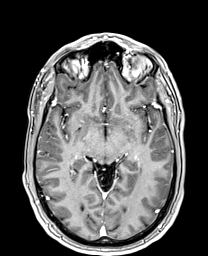
[im 89/160]
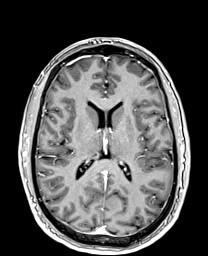
[im 107/160]
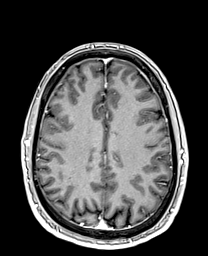
[im 124/160]
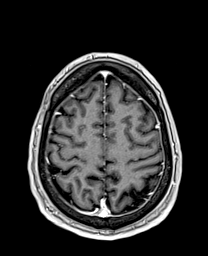
[im 142/160]
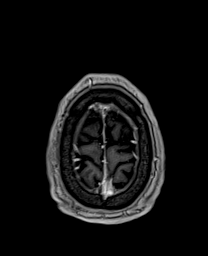
[im 160/160]
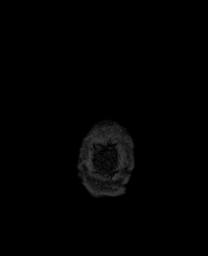

[Series 17: T1 post-contrast · coronal · 5.0mm · 0.43mm/px · 2 of 29 slices shown (2 of 3)]
[im 1/29]
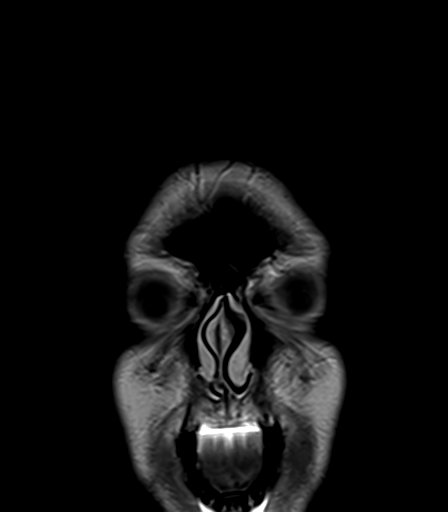
[im 29/29]
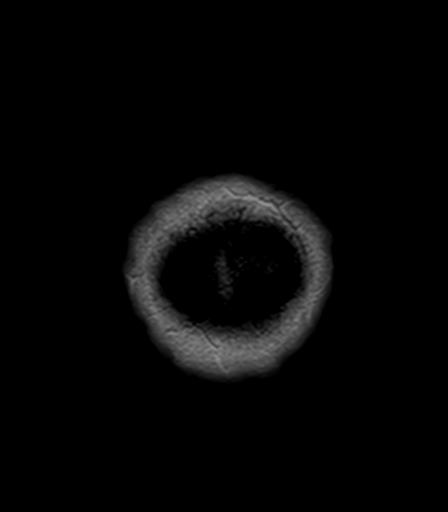

[Series 18: T1 post-contrast · sagittal · 5.0mm · 0.75mm/px · 1 of 25 slices shown (3 of 3)]
[im 1/25]
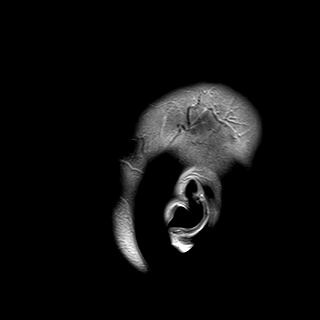

[48 of 48 positions shown; findings below may reference images not displayed]

FINDINGS: Brain: There is no evidence of an acute infarct, intracranial
hemorrhage, mass, midline shift, or extra-axial fluid collection.
The cerebellar tonsils are normally positioned. The ventricles and
sulci are normal. There is an enlarged partially empty sella. The
brain is normal in signal. No abnormal enhancement is identified.

Vascular: Major intracranial vascular flow voids are preserved.

Skull and upper cervical spine: Unremarkable bone marrow signal.

Sinuses/Orbits: Unremarkable orbits. Minimal mucosal thickening in
the paranasal sinuses. Clear mastoid air cells.

Other: None.
IMPRESSION: 1. Partially empty sella, often an incidental finding though can be
seen with idiopathic intracranial hypertension.
2. Otherwise unremarkable appearance of the brain. No acute finding.

## 2021-07-10 IMAGING — CT CT HEAD W/O CM
4 series · 17 of 47 positions shown, 19 images · non-contrast
Comparison: None.

CLINICAL DATA: Headache, new or worsening (Age >= 50y) 3 days
rapidly progressive bil headache with elevated CRP/ESR



[Series 2: head wo · axial · 0.43mm/px · z∈[-138,-23]mm · 7 of 31 slices shown, 9 images]
[im 4/31  brain]
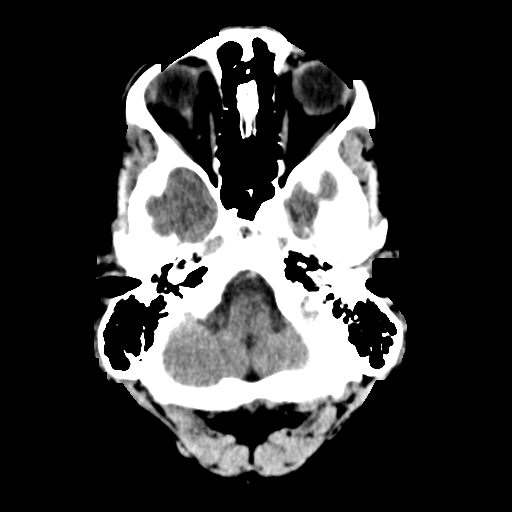
[im 4/31  bone]
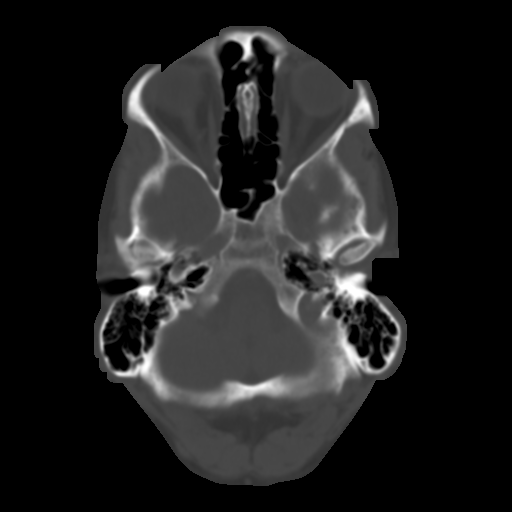
[im 8/31  brain]
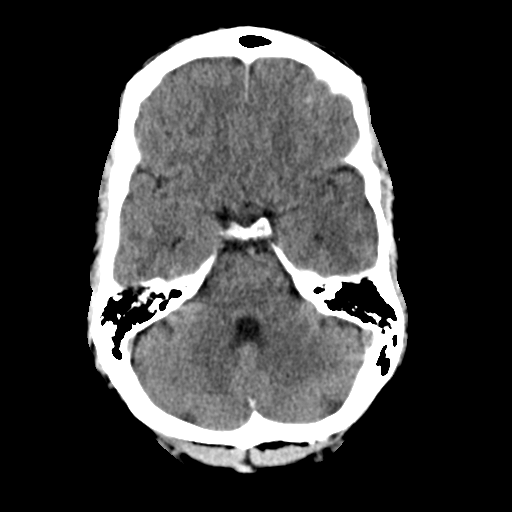
[im 12/31  brain]
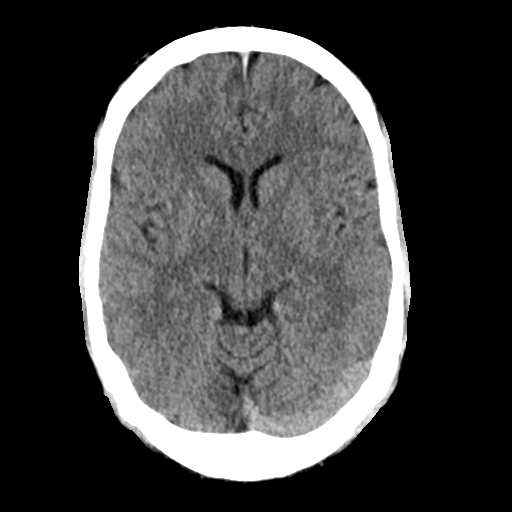
[im 16/31  brain]
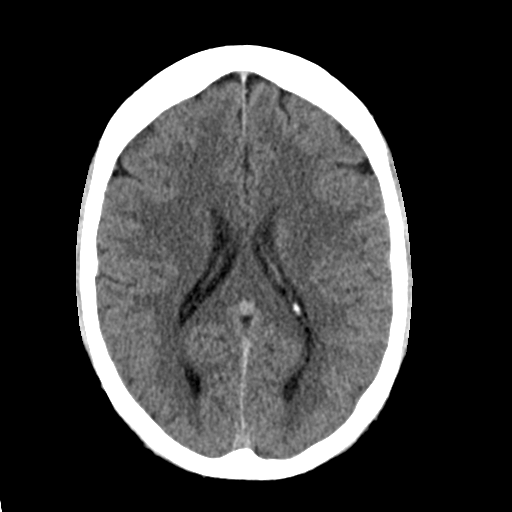
[im 19/31  brain]
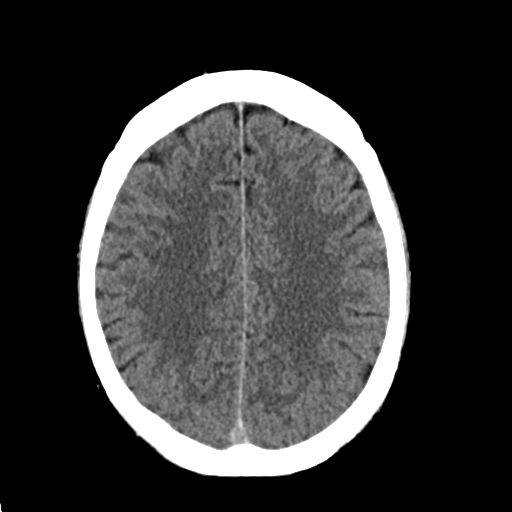
[im 19/31  bone]
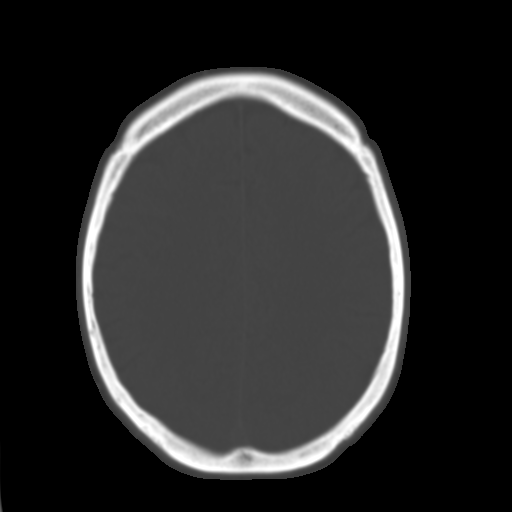
[im 23/31  brain]
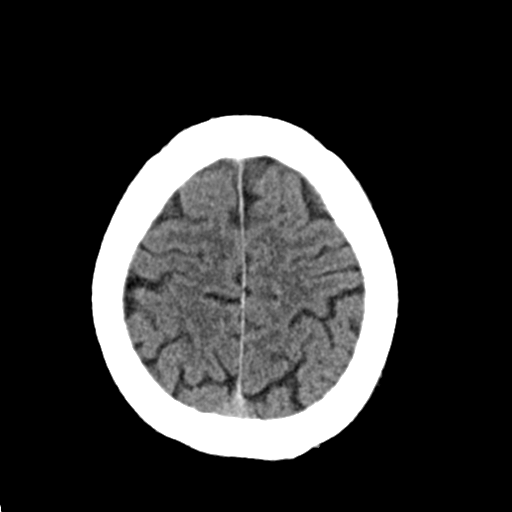
[im 27/31  brain]
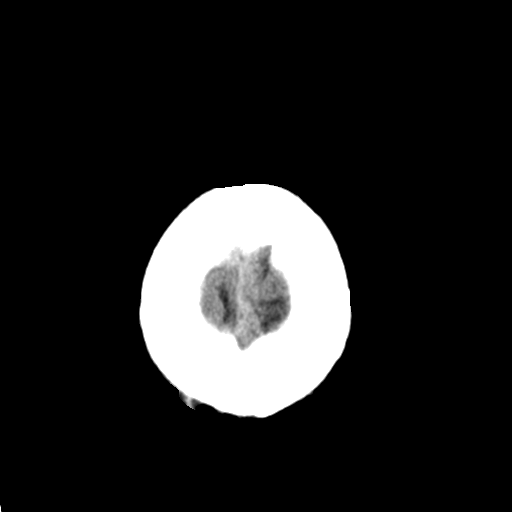

[Series 3: head bone · axial · 0.43mm/px · z∈[-139,-85]mm · 4 of 77 slices shown]
[im 8/77  bone]
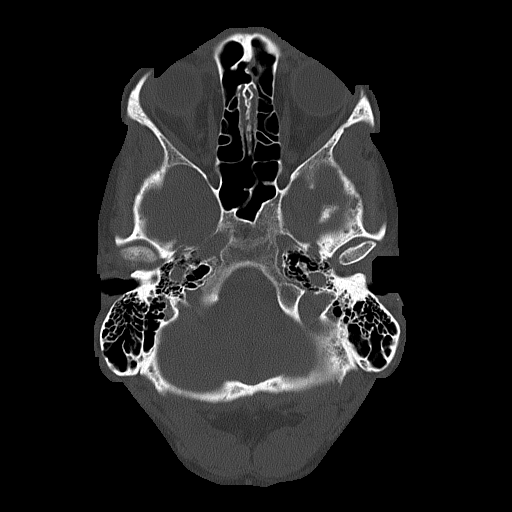
[im 16/77  bone]
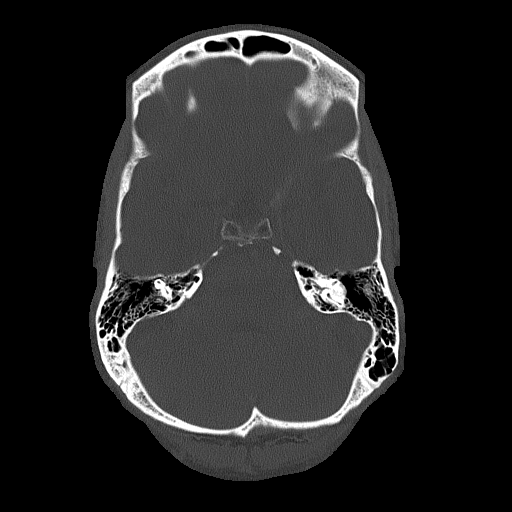
[im 23/77  bone]
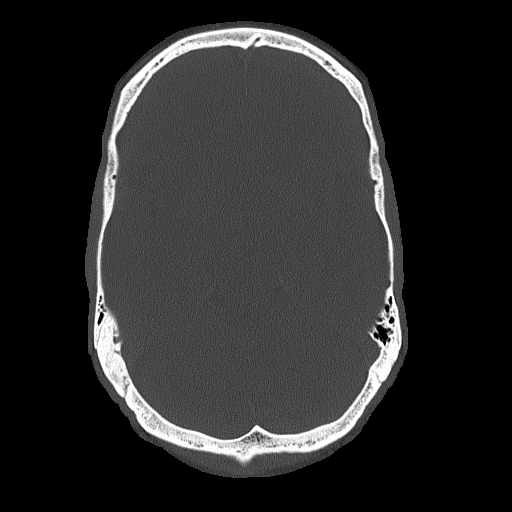
[im 35/77  bone]
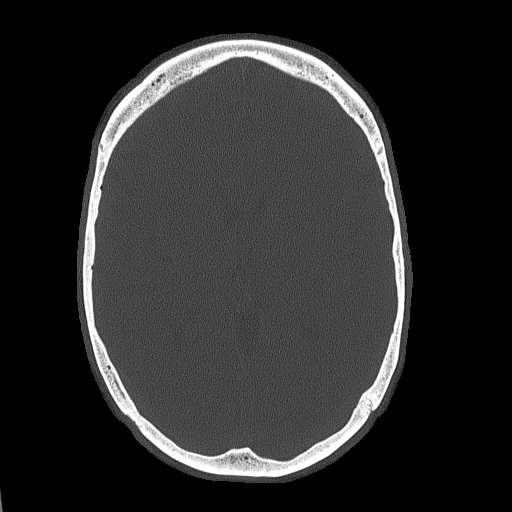

[Series 4: coronal soft tissue · coronal · 0.37mm/px · 3 of 70 slices shown]
[im 24/70  brain]
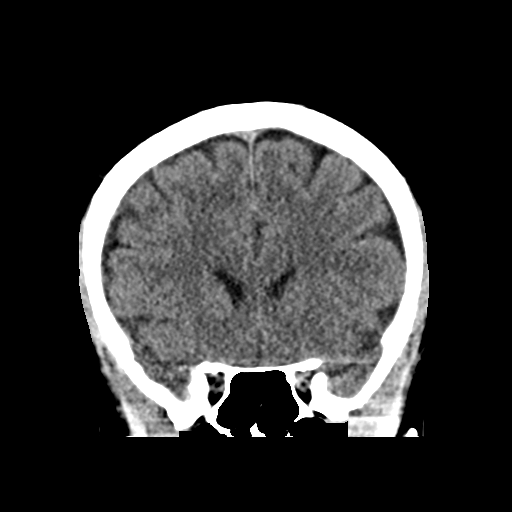
[im 31/70  brain]
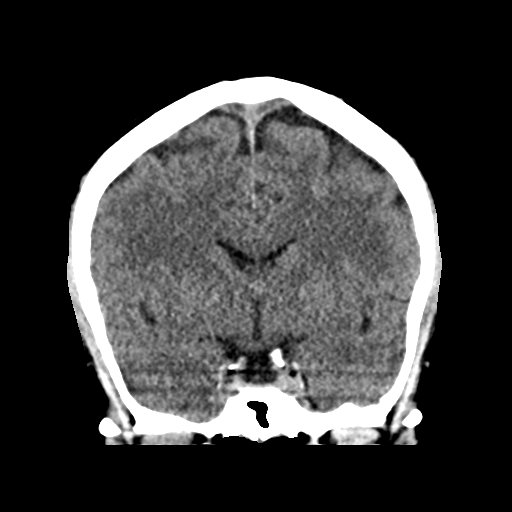
[im 39/70  brain]
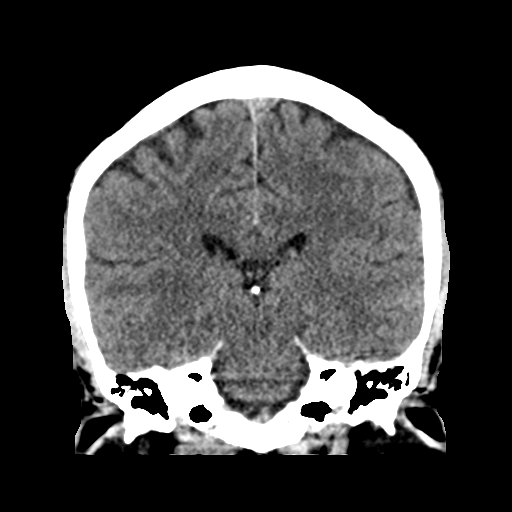

[Series 5: sagittal soft tissue · sagittal · 0.35mm/px · 3 of 61 slices shown]
[im 21/61  brain]
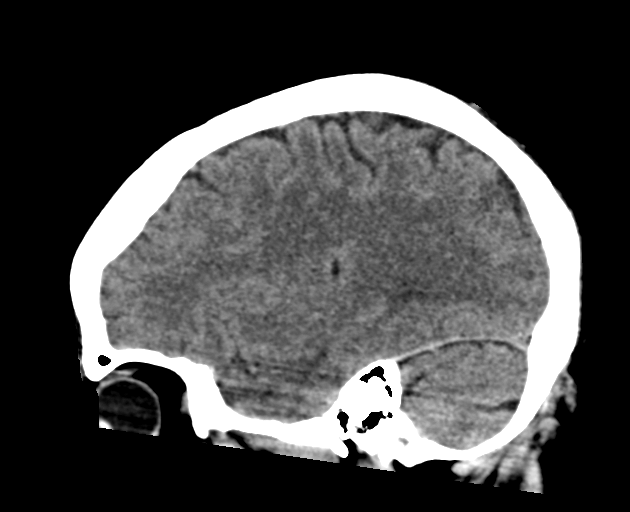
[im 31/61  brain]
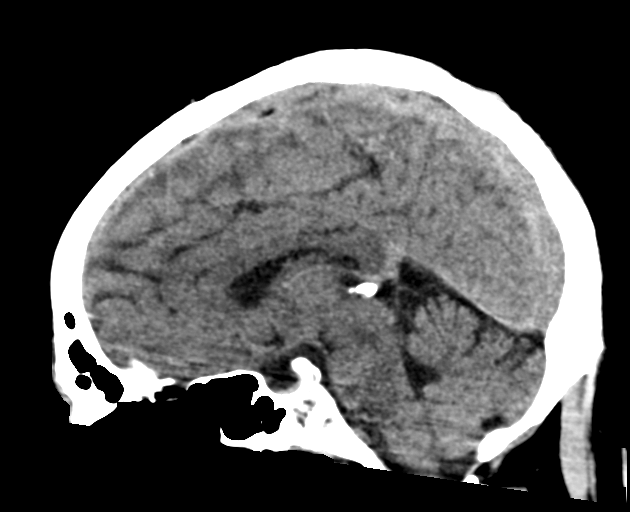
[im 41/61  brain]
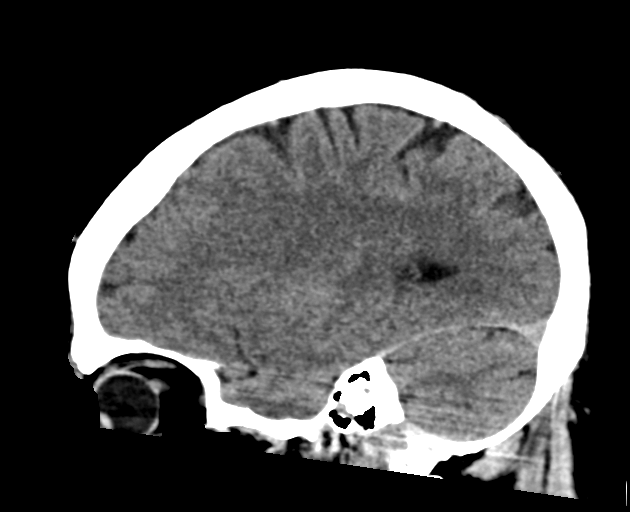

[17 of 47 positions shown; findings below may reference images not displayed]

FINDINGS: Brain: No evidence of acute infarction, hemorrhage, hydrocephalus,
extra-axial collection or mass lesion/mass effect.

Vascular: No hyperdense vessel or unexpected calcification.

Skull: Normal. Negative for fracture or focal lesion.

Sinuses/Orbits: No acute finding.

Other: None.
IMPRESSION: No acute intracranial abnormality.

## 2021-07-10 MED ORDER — GADOBUTROL 1 MMOL/ML IV SOLN
8.0000 mL | Freq: Once | INTRAVENOUS | Status: AC | PRN
  Administered 2021-07-10: 20:00:00 8 mL via INTRAVENOUS

## 2021-07-11 ENCOUNTER — Other Ambulatory Visit: Payer: Self-pay | Admitting: Internal Medicine

## 2021-07-11 ENCOUNTER — Other Ambulatory Visit: Payer: Self-pay | Admitting: Adult Health

## 2021-07-11 ENCOUNTER — Encounter: Payer: Self-pay | Admitting: Adult Health

## 2021-07-11 DIAGNOSIS — M316 Other giant cell arteritis: Secondary | ICD-10-CM | POA: Insufficient documentation

## 2021-07-11 DIAGNOSIS — R519 Headache, unspecified: Secondary | ICD-10-CM

## 2021-07-11 MED ORDER — OMEPRAZOLE 40 MG PO CPDR
DELAYED_RELEASE_CAPSULE | ORAL | 3 refills | Status: DC
Start: 2021-07-11 — End: 2022-12-11

## 2021-07-11 MED ORDER — PREDNISONE 20 MG PO TABS: ORAL_TABLET | ORAL | 0 refills | Status: DC

## 2021-07-11 MED ORDER — PREDNISONE 50 MG PO TABS: ORAL_TABLET | ORAL | 0 refills | Status: DC

## 2021-07-31 ENCOUNTER — Encounter: Payer: Self-pay | Admitting: Internal Medicine

## 2021-07-31 ENCOUNTER — Other Ambulatory Visit: Payer: Self-pay | Admitting: Adult Health

## 2021-07-31 DIAGNOSIS — E236 Other disorders of pituitary gland: Secondary | ICD-10-CM

## 2021-07-31 DIAGNOSIS — R930 Abnormal findings on diagnostic imaging of skull and head, not elsewhere classified: Secondary | ICD-10-CM

## 2021-07-31 DIAGNOSIS — M316 Other giant cell arteritis: Secondary | ICD-10-CM

## 2021-07-31 DIAGNOSIS — G4452 New daily persistent headache (NDPH): Secondary | ICD-10-CM

## 2021-07-31 NOTE — Addendum Note (Signed)
Addended by: Izora Ribas on: 07/31/2021 08:28 AM   Modules accepted: Orders

## 2021-08-02 ENCOUNTER — Other Ambulatory Visit: Payer: Self-pay

## 2021-08-02 ENCOUNTER — Ambulatory Visit: Payer: No Typology Code available for payment source | Admitting: Adult Health

## 2021-08-02 ENCOUNTER — Other Ambulatory Visit: Payer: Self-pay | Admitting: Adult Health

## 2021-08-02 ENCOUNTER — Encounter: Payer: Self-pay | Admitting: Psychiatry

## 2021-08-02 ENCOUNTER — Encounter: Payer: Self-pay | Admitting: Adult Health

## 2021-08-02 ENCOUNTER — Ambulatory Visit (INDEPENDENT_AMBULATORY_CARE_PROVIDER_SITE_OTHER): Payer: No Typology Code available for payment source | Admitting: Psychiatry

## 2021-08-02 VITALS — BP 137/84 | HR 76 | Ht 71.0 in | Wt 183.0 lb

## 2021-08-02 VITALS — BP 138/82 | HR 73 | Temp 97.9°F | Wt 184.0 lb

## 2021-08-02 DIAGNOSIS — C44722 Squamous cell carcinoma of skin of right lower limb, including hip: Secondary | ICD-10-CM

## 2021-08-02 DIAGNOSIS — E236 Other disorders of pituitary gland: Secondary | ICD-10-CM

## 2021-08-02 DIAGNOSIS — M316 Other giant cell arteritis: Secondary | ICD-10-CM | POA: Diagnosis not present

## 2021-08-02 NOTE — Progress Notes (Signed)
Office Visit Note  Patient: Stephen Campbell             Date of Birth: September 20, 1960           MRN: [id number]             PCP: Unk Pinto, MD Referring: Unk Pinto, MD Visit Date: 08/09/2021 Occupation: _0 @  Subjective:  History of headaches and elevated sedimentation rate  History of Present Illness: Stephen Campbell is a 61 y.o. male with history of psoriasis and osteoarthritis.  He returns today almost a year later.  He was accompanied by his wife today.  He states in January 2023 he started having severe headache in bilateral temporal region.  He was seen by his PCP.  He states that the headache was severe and did not respond to ibuprofen and Tylenol.  He also experienced photosensitivity and nausea but no visual changes.  He had no previous history of migraines.  At that time his PCP obtain lab work and his sedimentation rate was elevated at 55.  C-reactive protein was 76.4.  He was started on prednisone 50 mg p.o. daily.  He states he was also seen by his optometrist and was told that his eye exam was normal.  He has been tapering prednisone gradually and he is currently on prednisone 40 mg p.o. daily.  He noticed improvement in his symptoms after starting prednisone.  He currently has only occasional mild headaches.  There is no history of muscular weakness or tenderness.  He also had CT scan of his head and MRI of his brain.  MRI of the brain showed partially empty sella otherwise unremarkable.  He had evaluation by Dr. Billey Gosling, neurologist.  His neurological work-up was negative.  On reviewing the neurology records I noted that no papilledema was visualized on ophthalmological examination.  He denies any history of muscular weakness or muscle tenderness.  He still have some psoriasis but denies any history of joint swelling.  He gives history of intermittent discomfort in his trochanteric area, knee joints and hands.  He reports occasional episodes of [place] fasciitis.  Activities  of Daily Living:  Patient reports morning stiffness for 0 minutes.   Patient Denies nocturnal pain.  Difficulty dressing/grooming: Denies Difficulty climbing stairs: Denies Difficulty getting out of chair: Denies Difficulty using hands for taps, buttons, cutlery, and/or writing: Denies  Review of Systems  Constitutional:  Positive for fatigue.  HENT:  Positive for sore throat. Negative for mouth sores, mouth dryness and nose dryness.   Eyes:  Negative for pain, itching and dryness.  Respiratory:  Negative for shortness of breath and difficulty breathing.   Cardiovascular:  Negative for chest pain and palpitations.  Gastrointestinal:  Positive for constipation. Negative for blood in stool and diarrhea.  Endocrine: Negative for increased urination.  Genitourinary:  Negative for difficulty urinating.  Musculoskeletal:  Positive for joint pain, joint pain, joint swelling, myalgias, muscle tenderness and myalgias. Negative for morning stiffness.  Skin:  Positive for rash. Negative for color change.  Allergic/Immunologic: Negative for susceptible to infections.  Neurological:  Positive for headaches and parasthesias. Negative for dizziness, numbness, memory loss and weakness.  Hematological:  Negative for bruising/bleeding tendency.  Psychiatric/Behavioral:  Positive for sleep disturbance. Negative for confusion.    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/2014   Hypertension    Hyperlipidemia    Allergy    Asthma    Depression    GERD (gastroesophageal reflux disease)    ED (erectile dysfunction)     Plantar fasciitis 01/06/2012    Past Medical History:  Diagnosis Date   Allergy    Anxiety    Asthma    Basal cell carcinoma    Depression    ED (erectile dysfunction)    GERD (gastroesophageal reflux disease)    Hyperlipidemia    Hypertension    Hypogonadism in male    Osteoarthritis    Vitamin D deficiency     Family History  Problem Relation Age of Onset   Uterine cancer Mother    Asthma Father    Kidney disease Father    Rheum arthritis Father    Aortic stenosis Father        valve replacement   Ankylosing spondylitis Brother    Heart disease Brother    Cancer Paternal Grandmother    Heart attack Paternal Grandfather    Colon cancer Neg Hx    Colon polyps Neg Hx    Gallbladder disease Neg Hx    Esophageal cancer Neg Hx    Diabetes Neg Hx    Past Surgical History:  Procedure Laterality Date   HERNIA REPAIR Bilateral 1970   61 yrs old, inguinal    LASIK     ORTHOPEDIC SURGERY Left    left shoulder, bone spur, Dr. Denice Bors   PTOSIS REPAIR Bilateral 11/2020   Dr. Toy Cookey - [facility]   scalp biopsy     basal cell    VASECTOMY  2013   Social History   Social History Narrative   Not on file   Immunization History  Administered Date(s) Administered   Influenza Inj Mdck Quad Pf 04/24/2017   Influenza Inj Mdck Quad With Preservative 02/26/2018, 05/04/2020   Influenza Split 03/28/2013, 03/30/2014   Influenza, Seasonal, Injecte, Preservative Fre 05/04/2015   PFIZER Comirnaty(Gray Top)Covid-19 Tri-Sucrose Vaccine 03/21/2021   PFIZER(Purple Top)SARS-COV-2 Vaccination 06/24/2019, 07/15/2019, 06/10/2020   Tdap 02/20/2017     Objective: Vital Signs: BP (!) 157/101 (BP Location: Left Arm, Patient Position: Sitting, Cuff Size: Normal)    Pulse 68    Ht 6' (1.829 m)    Wt 187 lb 3.2 oz (84.9 kg)    BMI 25.39 kg/m    Physical Exam Vitals and nursing note reviewed.  Constitutional:      Appearance: He is well-developed.  HENT:     Head: Normocephalic and  atraumatic.  Eyes:     Conjunctiva/sclera: Conjunctivae normal.     Pupils: Pupils are equal, round, and reactive to light.  Cardiovascular:     Rate and Rhythm: Normal rate and regular rhythm.     Heart sounds: Normal heart sounds.     Comments: No temporal artery tenderness was noted. Pulmonary:     Effort: Pulmonary effort is normal.     Breath sounds: Normal breath sounds.  Abdominal:     General: Bowel sounds are normal.     Palpations: Abdomen is soft.  Musculoskeletal:     Cervical back: Normal range of motion and neck supple.     Comments: No muscular weakness or tenderness was noted.  Skin:  General: Skin is warm and dry.     Capillary Refill: Capillary refill takes less than 2 seconds.  Neurological:     Mental Status: He is alert and oriented to person, place, and time.  Psychiatric:        Behavior: Behavior normal.     Musculoskeletal Exam: C-spine thoracic and lumbar spine were in good range of motion.  Shoulder joints, elbow joints, wrist joints, MCPs PIPs and DIPs with good range of motion with no synovitis.  Hip joints, knee joints, ankles, MTPs and PIPs with good range of motion with no synovitis.  CDAI Exam: CDAI Score: -- Patient Global: --; Provider Global: -- Swollen: --; Tender: -- Joint Exam 08/09/2021   No joint exam has been documented for this visit   There is currently no information documented on the homunculus. Go to the Rheumatology activity and complete the homunculus joint exam.  Investigation: No additional findings.  Imaging: MR Brain W Wo Contrast  Result Date: 07/10/2021 CLINICAL DATA:  Headache, new or worsening (Age >= 50y). 2 days of bilateral severe progressive headache with elevated CRP. EXAM: MRI HEAD WITHOUT AND WITH CONTRAST TECHNIQUE: Multiplanar, multiecho pulse sequences of the brain and surrounding structures were obtained without and with intravenous contrast. CONTRAST:  64m GADAVIST GADOBUTROL 1 MMOL/ML IV SOLN  COMPARISON:  Head CT 07/10/2021 FINDINGS: Brain: There is no evidence of an acute infarct, intracranial hemorrhage, mass, midline shift, or extra-axial fluid collection. The cerebellar tonsils are normally positioned. The ventricles and sulci are normal. There is an enlarged partially empty sella. The brain is normal in signal. No abnormal enhancement is identified. Vascular: Major intracranial vascular flow voids are preserved. Skull and upper cervical spine: Unremarkable bone marrow signal. Sinuses/Orbits: Unremarkable orbits. Minimal mucosal thickening in the paranasal sinuses. Clear mastoid air cells. Other: None. IMPRESSION: 1. Partially empty sella, often an incidental finding though can be seen with idiopathic intracranial hypertension. 2. Otherwise unremarkable appearance of the brain. No acute finding. Electronically Signed   By: ALogan BoresM.D.   On: 07/10/2021 20:42    Recent Labs: Lab Results  Component Value Date   WBC 11.7 (H) 07/09/2021   HGB 13.0 (L) 07/09/2021   PLT 232 07/09/2021   NA 139 05/24/2021   K 4.2 05/24/2021   CL 103 05/24/2021   CO2 27 05/24/2021   GLUCOSE 103 (H) 05/24/2021   BUN 16 05/24/2021   CREATININE 1.03 05/24/2021   BILITOT 1.0 05/24/2021   ALKPHOS 39 (L) 08/15/2016   AST 31 05/24/2021   ALT 45 05/24/2021   PROT 7.4 05/24/2021   ALBUMIN 4.4 08/15/2016   CALCIUM 9.8 05/24/2021   GFRAA 101 11/16/2020    Speciality Comments: No specialty comments available.  Procedures:  No procedures performed Allergies: Enalapril   Assessment / Plan:     Visit Diagnoses: Temporal arteritis ([place] -he presented with temporal headaches and January 2023.  He was found to have elevated sedimentation rate and CRP.  He was placed on prednisone 50 mg p.o. daily.  He is gradually decrease it to 40 mg p.o. daily.  He is currently not having any headaches.  Based on the clinical history he had elevated sedimentation diagnosis of temporal arteritis was made.  He did of  counts regarding temporal arteritis was provided.  I will keep him on current dose of prednisone until follow-up visit.  The plan is to taper prednisone by 5 mg every 2 weeks after Actemra as a started.  Indications side effects contraindications  of Actemra were discussed at length with patient and his wife.  Handout was given and consent was taken.  We will apply for Actemra.  We will schedule temporal artery biopsy with vascular surgery.  I will also obtain an abdominal ultrasound due to the increased association of abdominal aneurysm with temporal arteritis.  MRA of chest will be obtained to look at the vasculature to rule out vasculitis and the other blood vessels.  Patient was in agreement with the plan.  Plan: ANA, Ambulatory referral to Ophthalmology, Ambulatory referral to Vascular Surgery, VAS [place] AAA DUPLEX, MR ANGIO CHEST W WO CONTRAST  Medication Counseling:  CBC    Component Value Date/Time   WBC 11.7 (H) 07/09/2021 1635   RBC 4.07 (L) 07/09/2021 1635   HGB 13.0 (L) 07/09/2021 1635   HCT 37.7 (L) 07/09/2021 1635   PLT 232 07/09/2021 1635   MCV 92.6 07/09/2021 1635   MCH 31.9 07/09/2021 1635   MCHC 34.5 07/09/2021 1635   RDW 11.7 07/09/2021 1635   LYMPHSABS 1,041 07/09/2021 1635   MONOABS 590 08/15/2016 0920   EOSABS 12 (L) 07/09/2021 1635   BASOSABS 35 07/09/2021 1635    CMP     Component Value Date/Time   NA 139 05/24/2021 1011   K 4.2 05/24/2021 1011   CL 103 05/24/2021 1011   CO2 27 05/24/2021 1011   GLUCOSE 103 (H) 05/24/2021 1011   BUN 16 05/24/2021 1011   CREATININE 1.03 05/24/2021 1011   CALCIUM 9.8 05/24/2021 1011   PROT 7.4 05/24/2021 1011   ALBUMIN 4.4 08/15/2016 0920   AST 31 05/24/2021 1011   ALT 45 05/24/2021 1011   ALKPHOS 39 (L) 08/15/2016 0920   BILITOT 1.0 05/24/2021 1011   GFRNONAA 87 11/16/2020 0912   GFRAA 101 11/16/2020 0912     Baseline Immunosuppressant Therapy Labs     Hepatitis Latest Ref Rng & Units 02/09/2015  Hep C Ab NEGATIVE  NEGATIVE    Lab Results  Component Value Date   HIV NONREACTIVE 02/09/2015       Serum Protein Electrophoresis Latest Ref Rng & Units 05/24/2021  Total Protein 6.1 - 8.1 g/dL 7.4    No results found for: G6PDH  No results found for: TPMT   Lipid Panel Lab Results  Component Value Date   CHOL 162 05/24/2021   HDL 52 05/24/2021   LDLCALC 82 05/24/2021   TRIG 179 (H) 05/24/2021   CHOLHDL 3.1 05/24/2021     Chest x-ray: August 16, 2019  Counseled patient that Actemra is an IL-6 blocking agent.  Reviewed Actemra dose of 4 mg/kg every 4 weeks.  Counseled patient on purpose, proper use, and adverse effects of Actemra.  Reviewed the most common adverse effects including infections, injection site reaction, bowel injury, and rarely cancer and conditions of the nervous system.  Reviewed that the medication should be held during infections.  Discussed that there is the possibility of an increased risk of malignancy but it is not well understood if this increased risk is due to the medication or the disease state.  Counseled patient that Actemra should be held prior to scheduled surgery.  Counseled patient to avoid live vaccines while on Actemra.  Recommend patient to get annual influenza vaccine, pneumococcal vaccine and Shingrix as indicated.   Reviewed the importance of regular labs while on Actemra therapy including the need for routine lipid panel. Standing orders placed.  Provided patient with medication education material and answered all questions.  Patient voiced understanding.  Patient consented to Actemra.  Will upload consent into the media tab.  Will submit benefits investigation for Actemra.     Elevated sed rate -secondary to temporal arteritis.  Repeat sedimentation rate on August 02, 2021 was normal.  C-reactive protein was normal.  Plan: ANA  High risk medication use -in anticipation to start him on immunosuppressive therapy I will obtain following labs today.  Plan:  Hepatitis B core antibody, IgM, Hepatitis B surface antigen, Hepatitis C antibody, QuantiFERON-TB Gold Plus, Serum protein electrophoresis with reflex, IgG, IgA, IgM, Pan-ANCA.  Information regarding immunization was also placed in the AVS.  He was advised not to get immunization until his prednisone dose is below 15 mg p.o. daily.  He was advised to hold Actemra in case he develops an infection and resume after the infection resolves.  Psoriasis-he gets intermittent rash from psoriasis.  HLA B27 positive  Primary osteoarthritis of both hands-he had no synovitis on examination.  He had DIP and PIP thickening with some discomfort off and on.  Trochanteric bursitis of both hips-he is off-and-on episodes of trochanteric bursitis.  Chondromalacia patellae, right knee-he still has some discomfort.  Primary osteoarthritis of both feet-has chronic discomfort in his feet due to underlying osteoarthritis.  Plantar fasciitis-currently not active.  Bilateral calcaneal spurs  DDD (degenerative disc disease), lumbar-he continues to have some discomfort in his lower back.  History of hypertension-blood pressure was elevated today most likely due to prednisone use.  History of hypercholesterolemia-increased risk of hyperlipidemia with Actemra was discussed.  He had baseline lipid panel.  LDL was within normal limits and triglycerides were elevated.  We will recheck lipid panel in 2 months.  History of anxiety  History of asthma  History of gastroesophageal reflux (GERD)  History of basal cell carcinoma  Orders: Orders Placed This Encounter  Procedures   MR ANGIO CHEST W WO CONTRAST   ANA   Hepatitis B core antibody, IgM   Hepatitis B surface antigen   Hepatitis C antibody   QuantiFERON-TB Gold Plus   Serum protein electrophoresis with reflex   IgG, IgA, IgM   Pan-ANCA   Ambulatory referral to Ophthalmology   Ambulatory referral to Vascular Surgery   VAS [place] AAA DUPLEX   No orders of  the defined types were placed in this encounter.   Follow-Up Instructions: Return in about 4 weeks (around 09/06/2021) for Temporal arteritis.   Bo Merino, MD  Note - This record has been created using Editor, commissioning.  Chart creation errors have been sought, but may not always  have been located. Such creation errors do not reflect on  the standard of medical care.

## 2021-08-02 NOTE — Progress Notes (Unsigned)
Assessment and Plan:  There are no diagnoses linked to this encounter.    Further disposition pending results of labs. Discussed med's effects and SE's.   Over 30 minutes of exam, counseling, chart review, and critical decision making was performed.   Future Appointments  Date Time Provider [place]  08/09/2021  8:00 AM Bo Merino, MD [facility] None  11/29/2021  9:30 AM Liane Comber, NP [facility] None  05/27/2022  9:00 AM Liane Comber, NP [facility] None    ------------------------------------------------------------------------------------------------------------------   HPI There were no vitals taken for this visit. 61 y.o.male presents for  Past Medical History:  Diagnosis Date   Allergy    Anxiety    Asthma    Basal cell carcinoma    Depression    ED (erectile dysfunction)    GERD (gastroesophageal reflux disease)    Hyperlipidemia    Hypertension    Hypogonadism in male    Osteoarthritis    Vitamin D deficiency      Allergies  Allergen Reactions   Enalapril Cough    Current Outpatient Medications on File Prior to Visit  Medication Sig   albuterol (VENTOLIN HFA) 108 (90 BASE) MCG/ACT inhaler Inhale 2 puffs into the lungs every 4 (four) hours as needed for wheezing or shortness of breath.   ALPRAZolam (XANAX) 0.5 MG tablet Take 1 tablet (0.5 mg total) by mouth 3 (three) times daily as needed for sleep or anxiety.   atorvastatin (LIPITOR) 10 MG tablet TAKE 1 TABLET ONCE DAILY FOR CHOLESTEROL.   diclofenac sodium (VOLTAREN) 1 % GEL 3 g to 3 large joints three times daily   doxycycline (VIBRAMYCIN) 100 MG capsule Take 1 capsule 2 x /day with meals for Infection   famotidine (PEPCID) 40 MG tablet TAKE 1 TABLET AT BEDTIME FOR ACID REFLUX AND INDIGESTION.   levocetirizine (XYZAL) 5 MG tablet Take 5 mg by mouth daily.   MAGNESIUM PO Take by mouth daily.   meloxicam (MOBIC) 15 MG tablet Take 1/2 to 1 tablet  Daily  with Food for Pain & Inflammation &  try limit to 5 days/week to Avoid Kidney Damage   NATESTO 5.5 MG/ACT GEL use 1 pump in each nostril three times daily *wash hands after use   olmesartan (BENICAR) 20 MG tablet Take  1 tablet  Daily  for BP   Omega-3 Fatty Acids (FISH OIL PO) Take by mouth daily.   omeprazole (PRILOSEC) 40 MG capsule Take  1 capsule  Daily  to Prevent Heartburn & Indigestion   predniSONE (DELTASONE) 50 MG tablet Take 1 tab daily for 10 days.   triamcinolone cream (KENALOG) 0.1 % Apply 1 application topically 2 (two) times daily.   vardenafil (LEVITRA) 20 MG tablet Take 1 tablet Daily if Needed for XXXX                                            /                    TAKE ONE TABLET BY MOUTH   No current facility-administered medications on file prior to visit.    ROS: all negative except above.   Physical Exam:  There were no vitals taken for this visit.  General Appearance: Well nourished, in no apparent distress. Eyes: PERRLA, EOMs, conjunctiva no swelling or erythema Sinuses: No Frontal/maxillary tenderness ENT/Mouth: Ext aud canals clear,  TMs without erythema, bulging. No erythema, swelling, or exudate on post pharynx.  Tonsils not swollen or erythematous. Hearing normal.  Neck: Supple, thyroid normal.  Respiratory: Respiratory effort normal, BS equal bilaterally without rales, rhonchi, wheezing or stridor.  Cardio: RRR with no MRGs. Brisk peripheral pulses without edema.  Abdomen: Soft, + BS.  Non tender, no guarding, rebound, hernias, masses. Lymphatics: Non tender without lymphadenopathy.  Musculoskeletal: Full ROM, 5/5 strength, normal gait.  Skin: Warm, dry without rashes, lesions, ecchymosis.  Neuro: Cranial nerves intact. Normal muscle tone, no cerebellar symptoms. Sensation intact.  Psych: Awake and oriented X 3, normal affect, Insight and Judgment appropriate.     Izora Ribas, NP 11:30 AM [facility]

## 2021-08-02 NOTE — Progress Notes (Signed)
Assessment and Plan:  Stephen Campbell was seen today for follow-up.  Diagnoses and all orders for this visit:  Giant cell arteritis (Stephen Campbell) Failing taper attempts from prednisone 50 mg to 40 mg, recheck ESR/CRP, if well controlled reattempt reduction to 40 mg but resume 50 mg if recurrent pain again, does have upcoming appointment with rheum Stephen Campbell, appreciate her expertise -     C-reactive protein -     Sedimentation rate  Squamous cell carcinoma of right thigh Further resection was recommended by Stephen Campbell per Stephen Campbell, he is requesting skin surgery center referral, placed.  -     Ambulatory referral to Dermatology   Further disposition pending results of labs. Discussed med's effects and SE's.   Over 30 minutes of exam, counseling, chart review, and critical decision making was performed.   Future Appointments  Date Time Provider [place]  08/09/2021  8:00 AM Bo Merino, MD [facility] None  11/29/2021  9:30 AM Liane Comber, NP [facility] None  05/27/2022  9:00 AM Liane Comber, NP [facility] None    ------------------------------------------------------------------------------------------------------------------   HPI BP 138/82    Pulse 73    Temp 97.9 F (36.6 C)    Wt 184 lb (83.5 kg)    SpO2 99%    BMI 25.66 kg/m  60 y.o.male presents for follow up on HA/giant cell arteritis.   He was seen 07/09/2021 with 3 days of progressive headache and temporal tenderness, ESR/CRP were elevated at 55 and 76.4 respectively. He denied vision changes. Prednisone 50 mg was initiated with immediate improvement and resolution in sx but has been unable to taper down since with dose taper attempts. He is established with Stephen Campbell for hx of HLA B27+ and has been scheduled urgently for an appointment 1 week from today.   As part of his HA workup he had MRI 07/10/2021 that showed a partially empty sella. He had opt evaluation that day with negative papilledema and HVF not  suggestive of chiasm issue at that time. Was able to be seen urgently by neuro Dr. Genia Campbell this AM, neg again for papilledema and she is in agreement with rheum evaluation, felt partially empty sella likely normal variant and no further workup was recommended.   Today he is accompanied by his wife. He is requesting a referral to skin surgery center, est derm Dr. Delman Campbell biopsied R thigh lesion in 02/2021, was advised SCC per Stephen Campbell and apparently unclear margins and was recommended return for full resection but office has been unable to schedule.   Past Medical History:  Diagnosis Date   Allergy    Anxiety    Asthma    Basal cell carcinoma    Depression    ED (erectile dysfunction)    GERD (gastroesophageal reflux disease)    Hyperlipidemia    Hypertension    Hypogonadism in male    Osteoarthritis    Vitamin D deficiency      Allergies  Allergen Reactions   Enalapril Cough    Current Outpatient Medications on File Prior to Visit  Medication Sig   albuterol (VENTOLIN HFA) 108 (90 BASE) MCG/ACT inhaler Inhale 2 puffs into the lungs every 4 (four) hours as needed for wheezing or shortness of breath.   ALPRAZolam (XANAX) 0.5 MG tablet Take 1 tablet (0.5 mg total) by mouth 3 (three) times daily as needed for sleep or anxiety.   atorvastatin (LIPITOR) 10 MG tablet TAKE 1 TABLET ONCE DAILY FOR CHOLESTEROL.   diclofenac sodium (VOLTAREN) 1 %  GEL 3 g to 3 large joints three times daily   famotidine (PEPCID) 40 MG tablet TAKE 1 TABLET AT BEDTIME FOR ACID REFLUX AND INDIGESTION.   levocetirizine (XYZAL) 5 MG tablet Take 5 mg by mouth daily.   MAGNESIUM PO Take by mouth daily.   meloxicam (MOBIC) 15 MG tablet Take 1/2 to 1 tablet  Daily  with Food for Pain & Inflammation & try limit to 5 days/week to Avoid Kidney Damage   NATESTO 5.5 MG/ACT GEL use 1 pump in each nostril three times daily *wash hands after use   olmesartan (BENICAR) 20 MG tablet Take  1 tablet  Daily  for BP   Omega-3  Fatty Acids (FISH OIL PO) Take by mouth daily.   omeprazole (PRILOSEC) 40 MG capsule Take  1 capsule  Daily  to Prevent Heartburn & Indigestion   predniSONE (DELTASONE) 50 MG tablet Take 1 tab daily for 10 days.   triamcinolone cream (KENALOG) 0.1 % Apply 1 application topically 2 (two) times daily.   vardenafil (LEVITRA) 20 MG tablet Take 1 tablet Daily if Needed for XXXX                                            /                    TAKE ONE TABLET BY MOUTH   doxycycline (VIBRAMYCIN) 100 MG capsule Take 1 capsule 2 x /day with meals for Infection   No current facility-administered medications on file prior to visit.   Allergies:  Allergies  Allergen Reactions   Enalapril Cough   Surgical History:  He  has a past surgical history that includes Vasectomy (2013); orthopedic surgery (Left); Hernia repair (Bilateral, 1970); LASIK; scalp biopsy; and Ptosis repair (Bilateral, 11/2020). Family History:  Hisfamily history includes Ankylosing spondylitis in his brother; Aortic stenosis in his father; Asthma in his father; Cancer in his paternal grandmother; Heart attack in his paternal grandfather; Heart disease in his brother; Kidney disease in his father; Rheum arthritis in his father; Uterine cancer in his mother. Social History:   reports that he has quit smoking. His smoking use included cigars. He has never used smokeless tobacco. He reports current alcohol use of about 14.0 standard drinks per week. He reports that he does not use drugs.   ROS: all negative except above.   Physical Exam:  BP 138/82    Pulse 73    Temp 97.9 F (36.6 C)    Wt 184 lb (83.5 kg)    SpO2 99%    BMI 25.66 kg/m   General Appearance: Well nourished, in no apparent distress. Eyes: PERRLA, EOMs, conjunctiva no swelling or erythema Sinuses: No Frontal/maxillary tenderness ENT/Mouth: Ext aud canals clear, TMs without erythema, bulging. No erythema, swelling, or exudate on post pharynx.  Tonsils not swollen or  erythematous. Hearing normal.  Neck: Supple Respiratory: Respiratory effort normal, BS equal bilaterally without rales, rhonchi, wheezing or stridor.  Cardio: RRR with no MRGs. Brisk peripheral pulses without edema.  Abdomen: Soft, + BS.  Non tender, no guarding, rebound, hernias, masses. Lymphatics: Non tender without lymphadenopathy.  Musculoskeletal: Full ROM, symmetrical strength, normal gait.  Skin: Warm, dry; right thigh with well healed biopsy site.  Neuro: Cranial nerves intact. Normal muscle tone, no cerebellar symptoms. Sensation intact.  Psych: Awake and oriented X 3, normal  affect, Insight and Judgment appropriate.     Izora Ribas, NP 11:30 AM [facility]

## 2021-08-02 NOTE — Progress Notes (Signed)
Referring:  Unk Pinto, MD [address] [place],  [place] [address]  PCP: Unk Pinto, MD  Neurology was asked to evaluate Macoy Rodwell, a 61 year old male for a chief complaint of headaches.  Our recommendations of care will be communicated by shared medical record.    CC:  headaches  HPI:  Medical co-morbidities: HLA B27 positive, psoriasis, HLD, HTN  The patient presents for evaluation of headaches which began a few weeks ago. He does not have a history of headaches prior to this. He developed a viral illness a few weeks ago, then a few days later developed a severe bitemporal headache with photophobia and nausea. Headache was constant for several days. Saw his PCP who ordered ESR/CRP. ESR was 55 and CRP 76.4. He was started on 50 mg prednisone for presumed temporal arteritis. This did resolve his headache. Has been on this dose for 3 weeks and has not been able to taper farther without headaches returning.   He did have one episode where he saw flickering lights in his left eye. This lasted less than 5 minutes. Otherwise no vision changes. He will have mild pain in the jaw, mostly at night. States this feels like TMJ pain (he is a Pharmacist, community).  MRI brain 07/10/21 showed a partially empty sella and was otherwise unremarkable. Saw an ophthalmologist who did not see papilledema.  Headache History: Onset: 3 weeks ago Triggers: stress Aura: flickering lights in periphery Location: bitemporal Quality/Description: squeezing, pressure Associated Symptoms:  Photophobia: yes  Phonophobia: no  Nausea: yes Worse with activity?: yes Duration of headaches: constant  Current Treatment: Abortive tylenol  Preventative none  Prior Therapies                                 Tylenol prednisone    LABS: ESR 55, CRP 76.4  IMAGING:  MRI brain without contrast 07/10/21: partially empty sella, otherwise unremarkable  Imaging independently reviewed on August 02, 2021   Current Outpatient Medications on File Prior to Visit  Medication Sig Dispense Refill   albuterol (VENTOLIN HFA) 108 (90 BASE) MCG/ACT inhaler Inhale 2 puffs into the lungs every 4 (four) hours as needed for wheezing or shortness of breath. 1 Inhaler 3   ALPRAZolam (XANAX) 0.5 MG tablet Take 1 tablet (0.5 mg total) by mouth 3 (three) times daily as needed for sleep or anxiety. 30 tablet 0   atorvastatin (LIPITOR) 10 MG tablet TAKE 1 TABLET ONCE DAILY FOR CHOLESTEROL. 90 tablet 3   diclofenac sodium (VOLTAREN) 1 % GEL 3 g to 3 large joints three times daily 3 Tube 3   famotidine (PEPCID) 40 MG tablet TAKE 1 TABLET AT BEDTIME FOR ACID REFLUX AND INDIGESTION. 90 tablet 3   levocetirizine (XYZAL) 5 MG tablet Take 5 mg by mouth daily.     MAGNESIUM PO Take by mouth daily.     meloxicam (MOBIC) 15 MG tablet Take 1/2 to 1 tablet  Daily  with Food for Pain & Inflammation & try limit to 5 days/week to Avoid Kidney Damage 90 tablet 1   NATESTO 5.5 MG/ACT GEL use 1 pump in each nostril three times daily *wash hands after use 21.96 g 5   olmesartan (BENICAR) 20 MG tablet Take  1 tablet  Daily  for BP 90 tablet 3   Omega-3 Fatty Acids (FISH OIL PO) Take by mouth daily.     omeprazole (PRILOSEC) 40  MG capsule Take  1 capsule  Daily  to Prevent Heartburn & Indigestion 90 capsule 3   predniSONE (DELTASONE) 50 MG tablet Take 1 tab daily for 10 days. 10 tablet 0   triamcinolone cream (KENALOG) 0.1 % Apply 1 application topically 2 (two) times daily. 45 g 1   vardenafil (LEVITRA) 20 MG tablet Take 1 tablet Daily if Needed for XXXX                                            /                    TAKE ONE TABLET BY MOUTH 30 tablet 1   No current facility-administered medications on file prior to visit.     Allergies: Allergies  Allergen Reactions   Enalapril Cough    Family History: Family History  Problem Relation Age of Onset   Uterine cancer Mother    Asthma Father    Kidney disease Father     Rheum arthritis Father    Aortic stenosis Father        valve replacement   Ankylosing spondylitis Brother    Heart disease Brother    Cancer Paternal Grandmother    Heart attack Paternal Grandfather    Colon cancer Neg Hx    Colon polyps Neg Hx    Gallbladder disease Neg Hx    Esophageal cancer Neg Hx    Diabetes Neg Hx     Past Medical History: Past Medical History:  Diagnosis Date   Allergy    Anxiety    Asthma    Basal cell carcinoma    Depression    ED (erectile dysfunction)    GERD (gastroesophageal reflux disease)    Hyperlipidemia    Hypertension    Hypogonadism in male    Osteoarthritis    Vitamin D deficiency     Past Surgical History Past Surgical History:  Procedure Laterality Date   HERNIA REPAIR Bilateral 1970   61 yrs old, inguinal    LASIK     ORTHOPEDIC SURGERY Left    left shoulder, bone spur, Dr. Denice Bors   PTOSIS REPAIR Bilateral 11/2020   Dr. Toy Cookey - [facility]   scalp biopsy     basal cell    VASECTOMY  2013    Social History: Social History   Tobacco Use   Smoking status: Former    Types: Cigars   Smokeless tobacco: Never   Tobacco comments:    smokes cigar rarely, every few years  Vaping Use   Vaping Use: Never used  Substance Use Topics   Alcohol use: Yes    Alcohol/week: 14.0 standard drinks    Types: 14 Glasses of wine per week   Drug use: No    ROS: Negative for fevers, chills. Positive for headaches. All other systems reviewed and negative unless stated otherwise in HPI.   Physical Exam:   Vital Signs: BP 137/84    Pulse 76    Ht 5' 11"  (1.803 m)    Wt 183 lb (83 kg)    BMI 25.52 kg/m  GENERAL: well appearing,in no acute distress,alert SKIN:  Color, texture, turgor normal. No rashes or lesions HEAD:  Normocephalic/atraumatic. CV:  RRR RESP: Normal respiratory effort MSK: +tenderness to palpation over bilateral temples  NEUROLOGICAL: Mental Status: Alert, oriented to person, place and  time,Follows  commands Cranial Nerves: PERRL,visual fields intact to confrontation,extraocular movements intact,facial sensation intact,no facial droop or ptosis,hearing grossly intact,no dysarthria Motor: muscle strength 5/5 both upper and lower extremities,no drift, normal tone Reflexes: 2+ throughout Sensation: intact to light touch all 4 extremities Coordination: Finger-to- nose-finger intact bilaterally,Heel-to-shin intact bilaterally Gait: normal-based   IMPRESSION: 61 year old male with a history of HLA B27 positive, psoriasis, HLD, HTN who presents for evaluation of headaches and abnormal MRI. Discussed findings of empty sella which is likely an incidental finding as there was no papilledema visualized on his ophthalmology exam. No further workup for IIH needed at this time. Discussed temporal artery biopsy and ultrasound, he has been on steroids for the past 3 weeks which may decrease the diagnostic yield. Will defer further [facility] workup to Rheumatology, he has an appointment scheduled with them for next week. Advised him to let his doctors know if he develops worsening vision or headaches.  PLAN: -No further workup for increased intracranial pressure needed at this time -Rheumatology appointment scheduled for next week  -Return to clinic if headaches worsen  I spent a total of 36 minutes chart reviewing and counseling the patient. Headache education was done. Images were reviewed with the patient. Written educational materials and patient instructions outlining all of the above were given.  Follow-up: as needed   [name], MD 08/02/2021   12:06 PM

## 2021-08-03 LAB — SEDIMENTATION RATE: Sed Rate: 2 mm/h (ref 0–20)

## 2021-08-03 LAB — C-REACTIVE PROTEIN: CRP: 0.4 mg/L (ref <8.0)

## 2021-08-07 ENCOUNTER — Other Ambulatory Visit: Payer: Self-pay | Admitting: Adult Health

## 2021-08-07 DIAGNOSIS — M316 Other giant cell arteritis: Secondary | ICD-10-CM

## 2021-08-07 MED ORDER — PREDNISONE 20 MG PO TABS: ORAL_TABLET | ORAL | 0 refills | Status: AC

## 2021-08-09 ENCOUNTER — Other Ambulatory Visit: Payer: Self-pay

## 2021-08-09 ENCOUNTER — Telehealth: Payer: Self-pay

## 2021-08-09 ENCOUNTER — Ambulatory Visit (INDEPENDENT_AMBULATORY_CARE_PROVIDER_SITE_OTHER): Payer: No Typology Code available for payment source | Admitting: Rheumatology

## 2021-08-09 ENCOUNTER — Encounter: Payer: Self-pay | Admitting: Rheumatology

## 2021-08-09 VITALS — BP 157/101 | HR 68 | Ht 72.0 in | Wt 187.2 lb

## 2021-08-09 DIAGNOSIS — Z8639 Personal history of other endocrine, nutritional and metabolic disease: Secondary | ICD-10-CM

## 2021-08-09 DIAGNOSIS — M722 Plantar fascial fibromatosis: Secondary | ICD-10-CM

## 2021-08-09 DIAGNOSIS — Z1589 Genetic susceptibility to other disease: Secondary | ICD-10-CM

## 2021-08-09 DIAGNOSIS — M19071 Primary osteoarthritis, right ankle and foot: Secondary | ICD-10-CM

## 2021-08-09 DIAGNOSIS — M7062 Trochanteric bursitis, left hip: Secondary | ICD-10-CM

## 2021-08-09 DIAGNOSIS — Z85828 Personal history of other malignant neoplasm of skin: Secondary | ICD-10-CM

## 2021-08-09 DIAGNOSIS — M2241 Chondromalacia patellae, right knee: Secondary | ICD-10-CM

## 2021-08-09 DIAGNOSIS — Z8709 Personal history of other diseases of the respiratory system: Secondary | ICD-10-CM

## 2021-08-09 DIAGNOSIS — M5136 Other intervertebral disc degeneration, lumbar region: Secondary | ICD-10-CM

## 2021-08-09 DIAGNOSIS — M19041 Primary osteoarthritis, right hand: Secondary | ICD-10-CM

## 2021-08-09 DIAGNOSIS — M7732 Calcaneal spur, left foot: Secondary | ICD-10-CM

## 2021-08-09 DIAGNOSIS — R2232 Localized swelling, mass and lump, left upper limb: Secondary | ICD-10-CM

## 2021-08-09 DIAGNOSIS — M51369 Other intervertebral disc degeneration, lumbar region without mention of lumbar back pain or lower extremity pain: Secondary | ICD-10-CM

## 2021-08-09 DIAGNOSIS — R700 Elevated erythrocyte sedimentation rate: Secondary | ICD-10-CM

## 2021-08-09 DIAGNOSIS — Z79899 Other long term (current) drug therapy: Secondary | ICD-10-CM | POA: Diagnosis not present

## 2021-08-09 DIAGNOSIS — M19042 Primary osteoarthritis, left hand: Secondary | ICD-10-CM

## 2021-08-09 DIAGNOSIS — Z8719 Personal history of other diseases of the digestive system: Secondary | ICD-10-CM

## 2021-08-09 DIAGNOSIS — M7061 Trochanteric bursitis, right hip: Secondary | ICD-10-CM

## 2021-08-09 DIAGNOSIS — M316 Other giant cell arteritis: Secondary | ICD-10-CM | POA: Diagnosis not present

## 2021-08-09 DIAGNOSIS — L409 Psoriasis, unspecified: Secondary | ICD-10-CM

## 2021-08-09 DIAGNOSIS — U071 COVID-19: Secondary | ICD-10-CM

## 2021-08-09 DIAGNOSIS — Z8679 Personal history of other diseases of the circulatory system: Secondary | ICD-10-CM

## 2021-08-09 DIAGNOSIS — M19072 Primary osteoarthritis, left ankle and foot: Secondary | ICD-10-CM

## 2021-08-09 DIAGNOSIS — M7731 Calcaneal spur, right foot: Secondary | ICD-10-CM

## 2021-08-09 DIAGNOSIS — Z8659 Personal history of other mental and behavioral disorders: Secondary | ICD-10-CM

## 2021-08-09 DIAGNOSIS — M7702 Medial epicondylitis, left elbow: Secondary | ICD-10-CM

## 2021-08-09 NOTE — Telephone Encounter (Signed)
Please apply for actemra per Dr. [name]. Thanks!  Consent obtained and sent to the scan center.

## 2021-08-09 NOTE — Patient Instructions (Signed)
Tocilizumab Injection What is this medication? TOCILIZUMAB (TOE si LIZ ue mab) is used to treat rheumatoid arthritis and juvenile idiopathic arthritis. It may also be used to treat giant cell arteritis, cytokine release syndrome, and to slow a decrease in lung function for patients with systemic sclerosis-related lung disease. This medicine may be used for other purposes; ask your health care provider or pharmacist if you have questions. COMMON BRAND NAME(S): Actemra What should I tell my care team before I take this medication? They need to know if you have any of these conditions: cancer diabetes (high blood sugar) heart disease hepatitis B or history of hepatitis B infection high blood pressure high cholesterol immune system problems infection especially a viral infection such as chickenpox, cold sores, or herpes infection such as tuberculosis (TB) or other bacterial, fungal or viral infections liver disease low blood counts, like low white cell, platelet, or red cell counts multiple sclerosis recent or upcoming vaccine stomach or intestine problems stroke an unusual or allergic reaction to tocilizumab, other medicines, foods, dyes, or preservatives pregnant or trying to get pregnant breast-feeding How should I use this medication? This medicine is injected into a vein or under the skin. It is usually given by a health care provider in a hospital or clinic setting. If you get this medicine at home, you will be taught how to prepare and give it. Use exactly as directed. Take it as directed on the prescription label at the same time every day. Keep taking it unless your health care provider tells you to stop. If you use a pen, be sure to take off the outer needle cover before using the dose. It is important that you put your used needles and syringes in a special sharps container. Do not put them in a trash can. If you do not have a sharps container, call your pharmacist or health care  provider to get one. A special MedGuide will be given to you before each treatment or by the pharmacist with each prescription and refill. Be sure to read this information carefully each time. Talk to your health care provider regarding the use of this medicine in children. While the drug may be prescribed for children as young as 2 years for selected conditions, precautions do apply. Overdosage: If you think you have taken too much of this medicine contact a poison control center or emergency room at once. NOTE: This medicine is only for you. Do not share this medicine with others. What if I miss a dose? It is important not to miss your dose. Call your health care provider if you are unable to keep an appointment. If you give yourself the medicine at home and you miss a dose, take it as soon as you can. If it is almost time for your next dose, take only that dose. Do not take double or extra doses. Call your health care provider with questions. What may interact with this medication? Do not take this medicine with any of the following medications: live virus vaccines This medicine may also interact with the following medications: biologic medicines such as abatacept, adalimumab, anakinra, certolizumab, etanercept, golimumab, infliximab, rituximab, secukinumab, ustekinumab birth control pills certain medicines for cholesterol like atorvastatin, lovastatin, and simvastatin cyclosporine omeprazole steroid medicines like prednisone or cortisone theophylline vaccines warfarin This list may not describe all possible interactions. Give your health care provider a list of all the medicines, herbs, non-prescription drugs, or dietary supplements you use. Also tell them if you smoke, drink  alcohol, or use illegal drugs. Some items may interact with your medicine. What should I watch for while using this medication? Your condition will be monitored carefully while you are receiving this medicine. Tell  your health care provider if your symptoms do not start to get better or if they get worse. You may need blood work done while you are taking this medicine. You will be tested for tuberculosis (TB) before you start this medicine. If your doctor prescribes any medicine for TB, you should start taking the TB medicine before starting this medicine. Make sure to finish the full course of TB medicine. This medicine may increase your risk of getting an infection. Call your health care provider for advice if you get a fever, chills, sore throat, or other symptoms of a cold or flu. Do not treat yourself. Try to avoid being around people who are sick. Talk to your health care provider about your risk of cancer. You may be more at risk for certain types of cancers if you take this medicine. What side effects may I notice from receiving this medication? Side effects that you should report to your doctor or health care professional as soon as possible: allergic reactions (skin rash, itching or hives; swelling of the face, lips, or tongue) changes in vision increase in blood pressure infection (fever, chills, cough, sore throat, pain or trouble passing urine) light-colored stool liver injury (dark yellow or brown urine; general ill feeling or flu-like symptoms; loss of appetite, right upper belly pain; unusually weak or tired; yellowing of the eyes or skin) low red blood cell counts (trouble breathing; feeling faint; lightheaded, falls; unusually weak or tired) pain, tingling, numbness in the hands or feet tears in the stomach or intestines (fever; stomach pain; sudden change in bowel habits) trouble breathing unusual bruising or bleeding Side effects that usually do not require medical attention (report to your doctor or health care professional if they continue or are bothersome): dizziness headache pain, redness, or irritation at site where injected This list may not describe all possible side effects.  Call your doctor for medical advice about side effects. You may report side effects to FDA at 1-800-FDA-1088. Where should I keep my medication? Keep out of the reach of children and pets. If you are using this medicine at home, you will be instructed on how to store this medicine. Throw away any unused medicine after the expiration date on the label. To get rid of medicines that are no longer needed or have expired: Take the medicine to a medicine take-back program. Check with your pharmacy or law enforcement to find a location. If you cannot return the medicine, ask your pharmacist or health care provider how to get rid of this medicine safely. NOTE: This sheet is a summary. It may not cover all possible information. If you have questions about this medicine, talk to your doctor, pharmacist, or health care provider.  2022 Elsevier/Gold Standard (2019-10-05 00:00:00)  Vaccines You are taking a medication(s) that can suppress your immune system.  The following immunizations are recommended: Flu annually Covid-19  Td/Tdap (tetanus, diphtheria, pertussis) every 10 years Pneumonia (Prevnar 15 then Pneumovax 23 at least 1 year apart.  Alternatively, can take Prevnar 20 without needing additional dose) Shingrix: 2 doses from 4 weeks to 6 months apart  Please check with your PCP to make sure you are up to date.   If you have signs or symptoms of an infection or start antibiotics: First, call your PCP  for workup of your infection. Hold your medication through the infection, until you complete your antibiotics, and until symptoms resolve if you take the following: Injectable medication (Actemra, Benlysta, Cimzia, Cosentyx, Enbrel, Humira, Kevzara, Orencia, Remicade, Simponi, Stelara, Taltz, Tremfya) Methotrexate Leflunomide (Arava) Mycophenolate (Cellcept) Morrie Sheldon, Olumiant, or Rinvoq

## 2021-08-13 NOTE — Progress Notes (Signed)
Hepatitis B, hepatitis C and TB Gold are negative.  Immunoglobulins, and SPEP are normal.  ANA is negative.  We should be able to start Actemra once approved by the insurance.

## 2021-08-14 ENCOUNTER — Other Ambulatory Visit (HOSPITAL_COMMUNITY): Payer: Self-pay

## 2021-08-14 ENCOUNTER — Telehealth: Payer: Self-pay | Admitting: Rheumatology

## 2021-08-14 NOTE — Telephone Encounter (Signed)
Received notification from [facility] regarding a prior authorization for [place]. Authorization has been APPROVED from 08/14/21 to 02/14/22.  ? ?Unable to run test claim as patient must fill through [place]: [phone]  ? ?Authorization # PA Case ID: [id number] ?Phone # [phone] ? ?Enrolled patient into copay card program: ?ID: [id number] ?Group : [id number] ?RxBIN: [id number] ?PCN: [id number] ?Payer ID: [id number] ? ?Called patient to schedule Actemra new start visit. Unable to reach - left VM requesting return call ? ?Knox Saliva, PharmD, MPH, BCPS ?Clinical Pharmacist (Rheumatology and Pulmonology) ? ?

## 2021-08-14 NOTE — Telephone Encounter (Signed)
Patient returned call. He is scheduled for Actemra new start on 08/15/21 ? ?Knox Saliva, PharmD, MPH, BCPS ?Clinical Pharmacist (Rheumatology and Pulmonology) ?

## 2021-08-14 NOTE — Telephone Encounter (Signed)
Spoke with patient stating we could move appt to Monday and he could come as late as 11:30am. He states he will keep Actemra new start appt for tomorrow 08/15/21 ? ?Knox Saliva, PharmD, MPH, BCPS ?Clinical Pharmacist (Rheumatology and Pulmonology) ?

## 2021-08-14 NOTE — Telephone Encounter (Signed)
Submitted an EXPEDITED Prior Authorization request to [facility] for ACTEMRA via CoverMyMeds. Will update once we receive a response. ? ?Key: [id number] ? ?Once approved through insurance, patient is eligible for Actemra copay card. ? ?Knox Saliva, PharmD, MPH, BCPS ?Clinical Pharmacist (Rheumatology and Pulmonology) ?

## 2021-08-14 NOTE — Telephone Encounter (Signed)
Patient called the office stating he has an appointment to start a new medication with [facility] tomorrow. Patient states he has an eye doctor appointment tomorrow at 3 and is afraid he might have to miss his appointment but really wants to start the medication. Patient would like advise on what to do. ?

## 2021-08-15 ENCOUNTER — Ambulatory Visit (INDEPENDENT_AMBULATORY_CARE_PROVIDER_SITE_OTHER): Payer: No Typology Code available for payment source | Admitting: Pharmacist

## 2021-08-15 ENCOUNTER — Other Ambulatory Visit: Payer: Self-pay

## 2021-08-15 VITALS — BP 127/81 | HR 75

## 2021-08-15 DIAGNOSIS — Z79899 Other long term (current) drug therapy: Secondary | ICD-10-CM

## 2021-08-15 DIAGNOSIS — Z7189 Other specified counseling: Secondary | ICD-10-CM

## 2021-08-15 DIAGNOSIS — M316 Other giant cell arteritis: Secondary | ICD-10-CM

## 2021-08-15 LAB — QUANTIFERON-TB GOLD PLUS
Mitogen-NIL: 4.74 IU/mL
NIL: 0.04 IU/mL
QuantiFERON-TB Gold Plus: NEGATIVE
TB1-NIL: <0 IU/mL
TB2-NIL: <0 IU/mL

## 2021-08-15 LAB — PROTEIN ELECTROPHORESIS, SERUM, WITH REFLEX
Albumin ELP: 4 g/dL (ref 3.8–4.8)
Alpha 1: 0.3 g/dL (ref 0.2–0.3)
Alpha 2: 0.8 g/dL (ref 0.5–0.9)
Beta 2: 0.3 g/dL (ref 0.2–0.5)
Beta Globulin: 0.4 g/dL (ref 0.4–0.6)
Gamma Globulin: 0.9 g/dL (ref 0.8–1.7)
Total Protein: 6.7 g/dL (ref 6.1–8.1)

## 2021-08-15 LAB — ANA: Anti Nuclear Antibody (ANA): NEGATIVE

## 2021-08-15 LAB — IGG, IGA, IGM
IgG (Immunoglobin G), Serum: 827 mg/dL (ref 600–1640)
IgM, Serum: 155 mg/dL (ref 50–300)
Immunoglobulin A: 282 mg/dL (ref 47–310)

## 2021-08-15 LAB — HEPATITIS B SURFACE ANTIGEN: Hepatitis B Surface Ag: NONREACTIVE

## 2021-08-15 LAB — HEPATITIS C ANTIBODY
Hepatitis C Ab: NONREACTIVE
SIGNAL TO CUT-OFF: <0.02 (ref <1.00)

## 2021-08-15 LAB — PAN-ANCA
ANCA SCREEN: NEGATIVE
Myeloperoxidase Abs: <1 AI (ref <1.0)
Serine Protease 3: <1 AI (ref <1.0)

## 2021-08-15 LAB — HEPATITIS B CORE ANTIBODY, IGM: Hep B C IgM: NONREACTIVE

## 2021-08-15 MED ORDER — ACTEMRA ACTPEN 162 MG/0.9ML ~~LOC~~ SOAJ: 162.0000 mg | SUBCUTANEOUS | 2 refills | Status: DC

## 2021-08-15 NOTE — Patient Instructions (Addendum)
Your next ACTEMRA dose is due on 08/22/21, 08/29/21, and every 7 days thereafter ? ?CONTINUE prednisone 40mg  daily x 2 weeks, then decrease by 5mg  every 2 weeks   ? ?HOLD ACTEMRA if you have signs or symptoms of an infection. You can resume once you feel better or back to your baseline. ?HOLD ACTEMRA if you start antibiotics to treat an infection. ?HOLD ACTEMRA around the time of surgery/procedures. Your surgeon will be able to provide recommendations on when to hold BEFORE and when you are cleared to [place]. ? ?Pharmacy information: ?Your prescription will be shipped from [facility]. ?Their phone number is [phone] ?Please call to schedule shipment and confirm address. They will mail your medication to your home. ? ?Cost information: ?Your copay should be affordable. If you call the pharmacy and it is not affordable, please double-check that they are billing through your copay card as secondary coverage. ?That copay card information is: ?ID: [id number] ?Group : [id number] ?RxBIN: [id number] ?PCN: [id number] ? ?Labs are due in 1 month then every 3 months. ?Lab hours are from Monday to Thursday 1:30-4:30pm and Friday 1:30-4pm. You do not need an appointment if you come for labs during these times. ? ?How to manage an injection site reaction: ?Remember the 5 C's: ?COUNTER - leave on the counter at least 30 minutes but up to overnight to bring medication to room temperature. This may help prevent stinging ?COLD - place something cold (like an ice gel pack or cold water bottle) on the injection site just before cleansing with alcohol. This may help reduce pain ?CLARITIN - use Claritin (generic name is loratadine) for the first two weeks of treatment or the day of, the day before, and the day after injecting. This will help to minimize injection site reactions ?CORTISONE CREAM - apply if injection site is irritated and itching ?CALL ME - if injection site reaction is bigger than the size of your fist, looks infected,  blisters, or if you develop hives ?

## 2021-08-15 NOTE — Progress Notes (Signed)
Pharmacy Note ? ?Subjective:   ?Patient presents to clinic today to receive first dose of Actemra for giant cell arteritis. He is currently taking prednisone 40mg  daily. ? ?Patient running a fever or have signs/symptoms of infection? No ? ?Patient currently on antibiotics for the treatment of infection? No ? ?Patient have any upcoming invasive procedures/surgeries? No ? ?Objective: ?CMP  ?   ?Component Value Date/Time  ? NA 139 05/24/2021 1011  ? K 4.2 05/24/2021 1011  ? CL 103 05/24/2021 1011  ? CO2 27 05/24/2021 1011  ? GLUCOSE 103 (H) 05/24/2021 1011  ? BUN 16 05/24/2021 1011  ? CREATININE 1.03 05/24/2021 1011  ? CALCIUM 9.8 05/24/2021 1011  ? PROT 6.7 08/09/2021 0849  ? ALBUMIN 4.4 08/15/2016 0920  ? AST 31 05/24/2021 1011  ? ALT 45 05/24/2021 1011  ? ALKPHOS 39 (L) 08/15/2016 0920  ? BILITOT 1.0 05/24/2021 1011  ? GFRNONAA 87 11/16/2020 0912  ? GFRAA 101 11/16/2020 0912  ? ? ?CBC ?   ?Component Value Date/Time  ? WBC 11.7 (H) 07/09/2021 1635  ? RBC 4.07 (L) 07/09/2021 1635  ? HGB 13.0 (L) 07/09/2021 1635  ? HCT 37.7 (L) 07/09/2021 1635  ? PLT 232 07/09/2021 1635  ? MCV 92.6 07/09/2021 1635  ? MCH 31.9 07/09/2021 1635  ? MCHC 34.5 07/09/2021 1635  ? RDW 11.7 07/09/2021 1635  ? LYMPHSABS 1,041 07/09/2021 1635  ? MONOABS 590 08/15/2016 0920  ? EOSABS 12 (L) 07/09/2021 1635  ? BASOSABS 35 07/09/2021 1635  ? ? ?Baseline Immunosuppressant Therapy Labs ?TB GOLD ?Quantiferon TB Gold Latest Ref Rng & Units 08/09/2021  ?Quantiferon TB Gold Plus NEGATIVE NEGATIVE  ? ?Hepatitis Panel ?Hepatitis Latest Ref Rng & Units 08/09/2021  ?Hep B Surface Ag NON-REACTIVE NON-REACTIVE  ?Hep B IgM NON-REACTIVE NON-REACTIVE  ?Hep C Ab NEGATIVE -  ?Hep C Ab NON-REACTIVE NON-REACTIVE  ?Hep C Ab NON-REACTIVE NON-REACTIVE  ? ?HIV ?Lab Results  ?Component Value Date  ? HIV NONREACTIVE 02/09/2015  ? ?Immunoglobulins ?Immunoglobulin Electrophoresis Latest Ref Rng & Units 08/09/2021  ?IgA  47 - 310 mg/dL 282  ?IgG 600 - 1,640 mg/dL 827  ?IgM 50 -  300 mg/dL 155  ? ?SPEP ?Serum Protein Electrophoresis Latest Ref Rng & Units 08/09/2021  ?Total Protein 6.1 - 8.1 g/dL 6.7  ?Albumin 3.8 - 4.8 g/dL 4.0  ?Alpha-1 0.2 - 0.3 g/dL 0.3  ?Alpha-2 0.5 - 0.9 g/dL 0.8  ?Beta Globulin 0.4 - 0.6 g/dL 0.4  ?Beta 2 0.2 - 0.5 g/dL 0.3  ?Gamma Globulin 0.8 - 1.7 g/dL 0.9  ? ?Lipid Panel  ?   ?Component Value Date/Time  ? CHOL 162 05/24/2021 1011  ? CHOL 156 11/11/2013 0908  ? TRIG 179 (H) 05/24/2021 1011  ? TRIG 114 11/11/2013 0908  ? HDL 52 05/24/2021 1011  ? HDL 48 11/11/2013 0908  ? CHOLHDL 3.1 05/24/2021 1011  ? VLDL 26 08/15/2016 0920  ? [place] 82 05/24/2021 1011  ? [place] 85 11/11/2013 0908  ? ?Chest x-ray: 08/16/19 - No active cardiopulmonary disease. ? ?Assessment/Plan:  ?Demonstrated proper injection technique with Actemra demo device  Patient able to demonstrate proper injection technique using the teach back method.  Patient self injected in the right thigh with: ? ?Sample Medication: Actemra 162mg /0.78mL ?[place]: [phone] ?Lot: [id number] ?Expiration: 10/2022 ? ?Patient tolerated well.  Observed for 30 mins in office for adverse reaction and none noted.  ? ?Patient is to return in 1 month for labs and 6-8 weeks for follow-up appointment.  Standing orders placed for CBC, CMP. Lipid panel will be check 4-8 weeks after starting ? ?Actemra approved through insurance .   Rx sent to: [facility]: [phone] .  Patient provided with pharmacy phone number as well as copay card information and advised to call later this week to schedule shipment to home. ? ?He will continue Actemra 162mg  SQ once weekly. ? ?He will taper prednisone by 5mg  every 2 weeks as detailed: continue 40mg  daily x 14 days, decrease to 35 mg daily x 14 days, 30 mg daily x 14 days, etc ? ?All questions encouraged and answered.  Instructed patient to call with any further questions or concerns. ? ?Knox Saliva, PharmD, MPH, BCPS ?Clinical Pharmacist (Rheumatology and Pulmonology) ? ?08/15/2021  8:20 AM ?

## 2021-08-16 ENCOUNTER — Ambulatory Visit (HOSPITAL_BASED_OUTPATIENT_CLINIC_OR_DEPARTMENT_OTHER): Payer: No Typology Code available for payment source

## 2021-08-16 ENCOUNTER — Ambulatory Visit (HOSPITAL_COMMUNITY)
Admission: RE | Admit: 2021-08-16 | Discharge: 2021-08-16 | Disposition: A | Discharge status: Home / Self Care | Payer: No Typology Code available for payment source | Source: Ambulatory Visit | Attending: Rheumatology | Admitting: Rheumatology

## 2021-08-16 ENCOUNTER — Other Ambulatory Visit: Payer: Self-pay | Admitting: Internal Medicine

## 2021-08-16 DIAGNOSIS — M316 Other giant cell arteritis: Secondary | ICD-10-CM | POA: Insufficient documentation

## 2021-08-16 IMAGING — MR MR MRA CHEST W/ OR W/O CM
19 series · 19 of 19 positions shown · IV contrast (gadavist)
Comparison: None.

CLINICAL DATA: Evaluate for vasculitis

EXAM:
MRA CHEST WITH OR WITHOUT CONTRAST
TECHNIQUE: Angiographic images of the chest were obtained using MRA technique
without and with intravenous contrast.
CONTRAST:  8mL GADAVIST GADOBUTROL 1 MMOL/ML IV SOLN

[Series 2: t2_trufi_tra_p2_bh · axial · 8.0mm · 0.62mm/px · 1 of 24 slices shown]
[im 1/24]
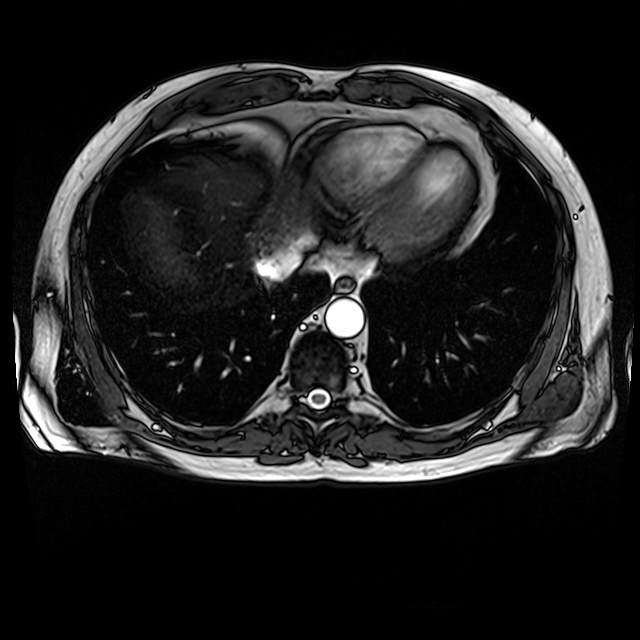

[Series 3: axial_db_haste_loc · axial · 7.0mm · 1.56mm/px · 1 of 24 slices shown]
[im 1/24]
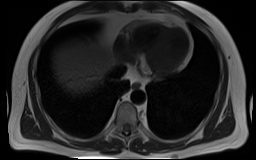

[Series 4: (person_name)_(person_name)_(person_name) · sagittal · 8.0mm · 1.79mm/px · 1 of 18 slices shown (1 of 10)]
[im 1/18]
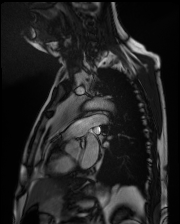

[Series 4: (person_name)_(person_name)_(person_name) · sagittal · 8.0mm · 1.79mm/px · 1 of 18 slices shown (2 of 10)]
[im 1/18]
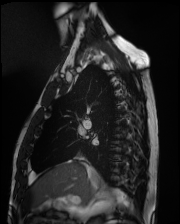

[Series 4: (person_name)_(person_name)_(person_name) · sagittal · 8.0mm · 1.79mm/px · 1 of 18 slices shown (3 of 10)]
[im 1/18]
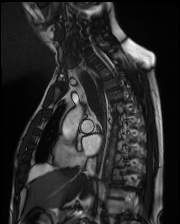

[Series 4: (person_name)_(person_name)_(person_name) · sagittal · 8.0mm · 1.79mm/px · 1 of 18 slices shown (4 of 10)]
[im 1/18]
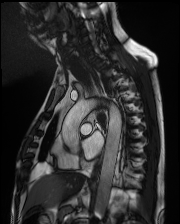

[Series 4: (person_name)_(person_name)_(person_name) · sagittal · 8.0mm · 1.79mm/px · 1 of 18 slices shown (5 of 10)]
[im 1/18]
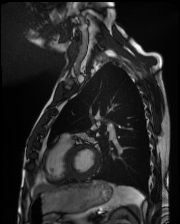

[Series 4: (person_name)_(person_name)_(person_name) · sagittal · 8.0mm · 1.79mm/px · 1 of 18 slices shown (6 of 10)]
[im 1/18]
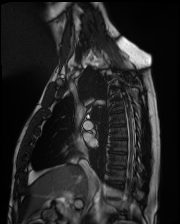

[Series 4: (person_name)_(person_name)_(person_name) · sagittal · 8.0mm · 1.79mm/px · 1 of 18 slices shown (7 of 10)]
[im 1/18]
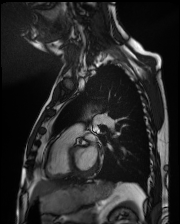

[Series 4: (person_name)_(person_name)_(person_name) · sagittal · 8.0mm · 1.79mm/px · 1 of 18 slices shown (8 of 10)]
[im 1/18]
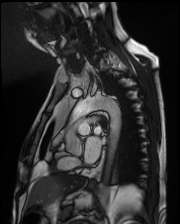

[Series 4: (person_name)_(person_name)_(person_name) · sagittal · 8.0mm · 1.79mm/px · 1 of 18 slices shown (9 of 10)]
[im 1/18]
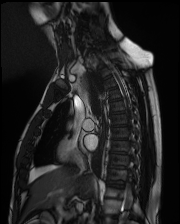

[Series 4: (person_name)_(person_name)_(person_name) · sagittal · 8.0mm · 1.79mm/px · 1 of 18 slices shown (10 of 10)]
[im 1/18]
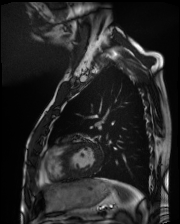

[Series 5: T1 dynamic · axial · non-contrast · 3.3mm · 1.18mm/px · 1 of 80 slices shown]
[im 1/80]
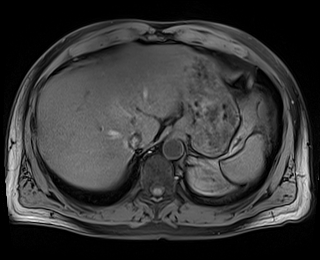

[Series 6: angio_fl3d_sag_pre · sagittal · 1.1mm · 1.17mm/px · 1 of 111 slices shown]
[im 1/111]
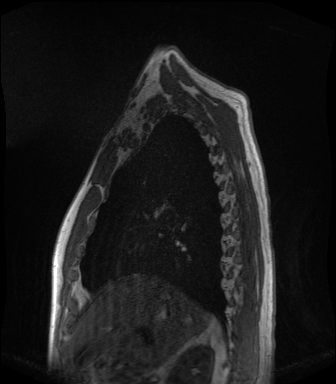

[Series 8: candy cane ce-arterial · sagittal · arterial · 1.1mm · 1.17mm/px · 1 of 112 slices shown]
[im 1/112]
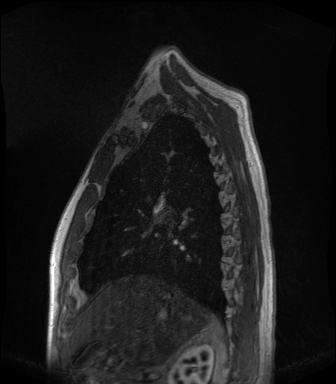

[Series 9: candy cane ce-arterial_sub · sagittal · 1.1mm · 1.17mm/px · 1 of 112 slices shown]
[im 1/112]
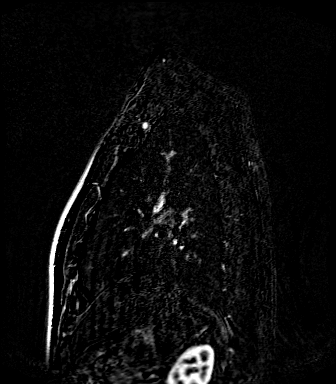

[Series 11: candy cane ce-venous · sagittal · portal-venous · 1.1mm · 1.17mm/px · 1 of 112 slices shown]
[im 1/112]
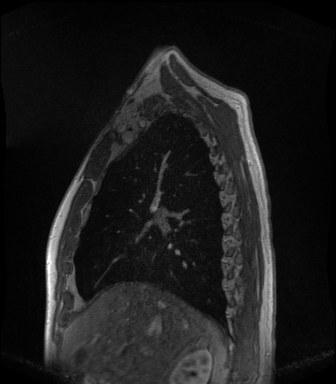

[Series 12: candy cane ce-venous_sub · sagittal · 1.1mm · 1.17mm/px · 1 of 112 slices shown]
[im 1/112]
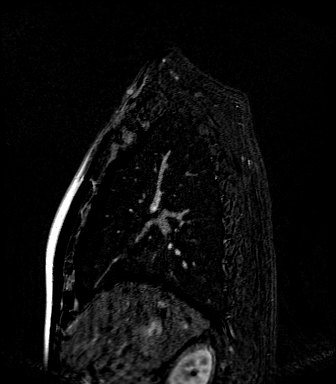

[Series 14: T1 dynamic post-contrast · axial · 3.3mm · 1.18mm/px · 1 of 80 slices shown]
[im 1/80]
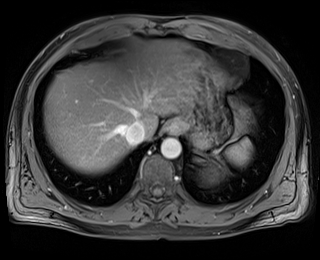

[19 of 19 positions shown; findings below may reference images not displayed]

FINDINGS: VASCULAR

Aorta: Conventional 3 vessel arch anatomy. No aneurysm, dissection,
wall thickening or stenosis.

Heart: The heart is normal in size. There is concentric hypertrophy
of the left ventricular myocardium. No pericardial effusion.

Pulmonary Arteries:  Normal caliber.  No evidence of central PE.

Other: Vertebral and carotid arteries are tortuous.

NON-VASCULAR

Lungs/Pleura: No focal signal abnormality or abnormal enhancement.
No pleural effusion.

Mediastinum: No mass or lymphadenopathy.

Upper abdomen: Unremarkable.

Bones: No focal signal abnormality or abnormal enhancement.
IMPRESSION: 1. No imaging evidence of vasculitis, aneurysm or arterial
dissection.
2. Concentric hypertrophy of the left ventricular myocardium
suggests the possibility of systemic arterial hypertension.
3. Mildly tortuous bilateral vertebral and internal carotid
arteries. This is also suggestive of underlying hypertension.
4. No unexpected non vascular abnormality identified.

## 2021-08-16 MED ORDER — GADOBUTROL 1 MMOL/ML IV SOLN
8.0000 mL | Freq: Once | INTRAVENOUS | Status: AC | PRN
  Administered 2021-08-16: 8 mL via INTRAVENOUS

## 2021-08-21 NOTE — Progress Notes (Signed)
I discussed results with the patient.

## 2021-08-22 ENCOUNTER — Encounter: Payer: Self-pay | Admitting: Adult Health

## 2021-08-22 ENCOUNTER — Encounter: Payer: Self-pay | Admitting: Rheumatology

## 2021-08-23 ENCOUNTER — Ambulatory Visit (INDEPENDENT_AMBULATORY_CARE_PROVIDER_SITE_OTHER): Payer: No Typology Code available for payment source | Admitting: Surgery

## 2021-08-23 ENCOUNTER — Other Ambulatory Visit: Payer: Self-pay

## 2021-08-23 ENCOUNTER — Encounter: Payer: Self-pay | Admitting: Rheumatology

## 2021-08-23 ENCOUNTER — Encounter: Payer: Self-pay | Admitting: Surgery

## 2021-08-23 VITALS — BP 144/98 | HR 78 | Temp 98.3°F | Resp 18 | Ht 72.0 in | Wt 185.0 lb

## 2021-08-23 DIAGNOSIS — M316 Other giant cell arteritis: Secondary | ICD-10-CM

## 2021-08-23 NOTE — Progress Notes (Signed)
? ?[facility] [place] ? ?Patient name: Stephen Campbell MRN: [id number] DOB: 1960-09-07 Sex: male ? ? ?REQUESTING PROVIDER:  ? ? Dr. [name] ? ? ?REASON FOR CONSULT:  ?  ?Temporal arteritis ? ?HISTORY OF PRESENT ILLNESS:  ? ?Stephen Campbell is a 61 y.o. male, who is referred for evaluation of temporal arteritis.  He has been having bilateral severe headaches.  He is now on steroids.  His sed rate and C-reactive protein were elevated. ? ?PAST MEDICAL HISTORY  ? ? ?Past Medical History:  ?Diagnosis Date  ? Allergy   ? Anxiety   ? Asthma   ? Basal cell carcinoma   ? Depression   ? ED (erectile dysfunction)   ? GERD (gastroesophageal reflux disease)   ? Hyperlipidemia   ? Hypertension   ? Hypogonadism in male   ? Osteoarthritis   ? Vitamin D deficiency   ? ? ? ?FAMILY HISTORY  ? ?Family History  ?Problem Relation Age of Onset  ? Uterine cancer Mother   ? Asthma Father   ? Kidney disease Father   ? Rheum arthritis Father   ? Aortic stenosis Father   ?     valve replacement  ? Ankylosing spondylitis Brother   ? Heart disease Brother   ? Cancer Paternal Grandmother   ? Heart attack Paternal Grandfather   ? Colon cancer Neg Hx   ? Colon polyps Neg Hx   ? Gallbladder disease Neg Hx   ? Esophageal cancer Neg Hx   ? Diabetes Neg Hx   ? ? ?SOCIAL HISTORY:  ? ?Social History  ? ?Socioeconomic History  ? Marital status: Married  ?  Spouse name: Not on file  ? Number of children: 0  ? Years of education: Not on file  ? Highest education level: Not on file  ?Occupational History  ? Occupation: Dentist  ?Tobacco Use  ? Smoking status: Former  ?  Types: Cigars  ?  Passive exposure: Never  ? Smokeless tobacco: Never  ? Tobacco comments:  ?  smokes cigar rarely, every few years  ?Vaping Use  ? Vaping Use: Never used  ?Substance and Sexual Activity  ? Alcohol use: Yes  ?  Alcohol/week: 14.0 standard drinks  ?  Types: 14 Glasses of wine per week  ? Drug use: No  ? Sexual activity: Yes   ?  Partners: Female  ?  Birth control/protection: Surgical  ?Other Topics Concern  ? Not on file  ?Social History Narrative  ? Not on file  ? ?Social Determinants of Health  ? ?Financial Resource Strain: Not on file  ?Food Insecurity: Not on file  ?Transportation Needs: Not on file  ?Physical Activity: Not on file  ?Stress: Not on file  ?Social Connections: Not on file  ?Intimate Partner Violence: Not on file  ? ? ?ALLERGIES:  ? ? ?Allergies  ?Allergen Reactions  ? Enalapril Cough  ? ? ?CURRENT MEDICATIONS:  ? ? ?Current Outpatient Medications  ?Medication Sig Dispense Refill  ? albuterol (VENTOLIN HFA) 108 (90 BASE) MCG/ACT inhaler Inhale 2 puffs into the lungs every 4 (four) hours as needed for wheezing or shortness of breath. 1 Inhaler 3  ? ALPRAZolam (XANAX) 0.5 MG tablet Take 1 tablet (0.5 mg total) by mouth 3 (three) times daily as needed for sleep or anxiety. 30 tablet 0  ? atorvastatin (LIPITOR) 10 MG tablet TAKE 1 TABLET ONCE DAILY FOR CHOLESTEROL. 90 tablet 3  ? diclofenac sodium (VOLTAREN) 1 %  GEL 3 g to 3 large joints three times daily 3 Tube 3  ? famotidine (PEPCID) 40 MG tablet TAKE 1 TABLET AT BEDTIME FOR ACID REFLUX AND INDIGESTION. 90 tablet 3  ? levocetirizine (XYZAL) 5 MG tablet Take 5 mg by mouth daily.    ? MAGNESIUM PO Take by mouth daily.    ? meloxicam (MOBIC) 15 MG tablet Take 1/2 to 1 tablet  Daily  with Food for Pain & Inflammation & try limit to 5 days/week to Avoid Kidney Damage 90 tablet 1  ? NATESTO 5.5 MG/ACT GEL use 1 pump in each nostril three times daily *wash hands after use 21.96 g 5  ? olmesartan (BENICAR) 20 MG tablet Take  1 tablet  Daily  for BP 90 tablet 3  ? omeprazole (PRILOSEC) 40 MG capsule Take  1 capsule  Daily  to Prevent Heartburn & Indigestion 90 capsule 3  ? Tocilizumab (ACTEMRA ACTPEN) 162 MG/0.9ML SOAJ Inject 162 mg into the skin every 7 (seven) days. 3.6 mL 2  ? triamcinolone cream (KENALOG) 0.1 % Apply 1 application topically 2 (two) times daily. 45 g 1  ?  vardenafil (LEVITRA) 20 MG tablet Take 1 tablet Daily if Needed for XXXX                                            /                    TAKE ONE TABLET BY MOUTH 30 tablet 1  ? ?No current facility-administered medications for this visit.  ? ? ?REVIEW OF SYSTEMS:  ? ?'[X]'$  denotes positive finding, '[ ]'$  denotes negative finding ?Cardiac  Comments:  ?Chest pain or chest pressure:    ?Shortness of breath upon exertion:    ?Short of breath when lying flat:    ?Irregular heart rhythm:    ?    ?Vascular    ?Pain in calf, thigh, or hip brought on by ambulation:    ?Pain in feet at night that wakes you up from your sleep:     ?Blood clot in your veins:    ?Leg swelling:     ?    ?Pulmonary    ?Oxygen at home:    ?Productive cough:     ?Wheezing:     ?    ?Neurologic    ?Sudden weakness in arms or legs:     ?Sudden numbness in arms or legs:     ?Sudden onset of difficulty speaking or slurred speech:    ?Temporary loss of vision in one eye:     ?Problems with dizziness:     ?    ?Gastrointestinal    ?Blood in stool:     ? ?Vomited blood:     ?    ?Genitourinary    ?Burning when urinating:     ?Blood in urine:    ?    ?Psychiatric    ?Major depression:     ?    ?Hematologic    ?Bleeding problems:    ?Problems with blood clotting too easily:    ?    ?Skin    ?Rashes or ulcers:    ?    ?Constitutional    ?Fever or chills:    ? ?PHYSICAL EXAM:  ? ?Vitals:  ? 08/23/21 0848  ?BP: (!) 144/98  ?Pulse: 78  ?  Resp: 18  ?Temp: 98.3 ?F (36.8 ?C)  ?TempSrc: Temporal  ?SpO2: 97%  ?Weight: 185 lb (83.9 kg)  ?Height: 6' (1.829 m)  ? ? ?GENERAL: The patient is a well-nourished male, in no acute distress. The vital signs are documented above. ?CARDIAC: There is a regular rate and rhythm.  ?VASCULAR: Palpable left temporal pulse ?PULMONARY: Nonlabored respirations ?MUSCULOSKELETAL: There are no major deformities or cyanosis. ?NEUROLOGIC: No focal weakness or paresthesias are detected. ?SKIN: There are no ulcers or rashes noted. ?PSYCHIATRIC: The  patient has a normal affect. ? ?STUDIES:  ? ?None ? ?ASSESSMENT and PLAN  ? ?Temporal arteritis: I have been asked to proceed with temporal artery biopsy.  His left side appears to impact him the most.  I discussed that I would perform unilateral left temporal artery biopsy.  I discussed the details of the procedure and answered all of his questions.  He has been scheduled for Thursday, March 16. ? ? ?Annamarie Major, IV, MD, FACS ?[facility] [place] ?Tel [phone] ?Pager [phone]  ?

## 2021-08-26 ENCOUNTER — Other Ambulatory Visit: Payer: Self-pay

## 2021-08-26 NOTE — Telephone Encounter (Signed)
Next Visit: 09/12/2021 ? ?Last Visit: 08/09/2021 ? ?Dx: Temporal arteritis  ? ?Current Dose per office note on 08/09/2021: He is gradually decrease it to 40 mg p.o. daily. The plan is to taper prednisone by 5 mg every 2 weeks after Actemra as a started.  ? ?Okay to refill Prednisone?   ?

## 2021-08-27 ENCOUNTER — Telehealth: Payer: Self-pay

## 2021-08-27 MED ORDER — PREDNISONE 20 MG PO TABS: 20.0000 mg | ORAL_TABLET | Freq: Every day | ORAL | 0 refills | Status: DC

## 2021-08-27 MED ORDER — PREDNISONE 10 MG PO TABS: 15.0000 mg | ORAL_TABLET | Freq: Every day | ORAL | 0 refills | Status: DC

## 2021-08-27 NOTE — Telephone Encounter (Signed)
Received a call from patient's wife. Stated patient does not wish to proceed with temporal artery biopsy on 08/30/21 due to his out of pocket cost. Advised will cancel procedure and to contact office if he changes his mind and would like to reschedule. Wife verbalized understanding. Provider made aware.  ?

## 2021-08-30 ENCOUNTER — Encounter (HOSPITAL_COMMUNITY): Admission: RE | Payer: Self-pay | Source: Ambulatory Visit

## 2021-08-30 ENCOUNTER — Ambulatory Visit (INDEPENDENT_AMBULATORY_CARE_PROVIDER_SITE_OTHER): Payer: No Typology Code available for payment source

## 2021-08-30 ENCOUNTER — Ambulatory Visit (HOSPITAL_COMMUNITY)
Admission: RE | Admit: 2021-08-30 | Payer: No Typology Code available for payment source | Source: Ambulatory Visit | Admitting: Surgery

## 2021-08-30 ENCOUNTER — Other Ambulatory Visit: Payer: Self-pay

## 2021-08-30 DIAGNOSIS — M316 Other giant cell arteritis: Secondary | ICD-10-CM

## 2021-08-30 SURGERY — BIOPSY TEMPORAL ARTERY
Anesthesia: Choice | Laterality: Left

## 2021-08-30 NOTE — Progress Notes (Signed)
No abdominal aneurysm noted on the ultrasound.

## 2021-08-30 NOTE — Progress Notes (Signed)
? ?Office Visit Note ? ?Patient: Stephen Campbell             ?Date of Birth: Sep 22, 1960           ?MRN: [id number]             ?PCP: Unk Pinto, MD ?Referring: Unk Pinto, MD ?Visit Date: 09/12/2021 ?Occupation: @GUAROCC @ ? ?Subjective:  ?Follow-up (Heart racing, fogginess, fatigue, difficulty focusing, bil finger numbness and tingling, difficulty sleeping, head ache pain) ? ? ?History of Present Illness: Stephen Campbell is a 61 y.o. male with history of temporal arteritis, osteoarthritis, psoriasis and degenerative disc disease.  He started Actemra injections on August 15, 2021.  He has been on it for almost 4 weeks now.  He has been doing gradual prednisone taper.  This week he reduced prednisone to 30 mg p.o. every morning.  He states he gets some headaches towards the end of the day which is hard for him to differentiate from his stress-related headaches or due to prednisone use or temporal arteritis.  He is experiencing side effects from prednisone including palpitations, fatigue, insomnia and fogginess.  He has been experiencing numbness in his bilateral hands for the last several years.  The symptoms have increased more in the last 2 months. ? ?Activities of Daily Living:  ?Patient reports morning stiffness for 0  none .   ?Patient Denies nocturnal pain.  ?Difficulty dressing/grooming: Denies ?Difficulty climbing stairs: Denies ?Difficulty getting out of chair: Denies ?Difficulty using hands for taps, buttons, cutlery, and/or writing: Denies ? ?Review of Systems  ?Constitutional:  Positive for fatigue.  ?HENT:  Negative for mouth dryness.   ?Eyes:  Negative for dryness.  ?Respiratory:  Negative for shortness of breath.   ?Cardiovascular:  Negative for swelling in legs/feet.  ?Gastrointestinal:  Negative for constipation.  ?Endocrine: Positive for increased urination.  ?Genitourinary:  Negative for difficulty urinating.  ?Musculoskeletal:  Negative for joint pain, joint pain and joint swelling.  ?Skin:   Positive for redness.  ?Allergic/Immunologic: Negative for susceptible to infections.  ?Neurological:  Positive for numbness.  ?Hematological:  Negative for bruising/bleeding tendency.  ?Psychiatric/Behavioral:  Positive for sleep disturbance.   ? ?PMFS History:  ?Patient Active Problem List  ? Diagnosis Date Noted  ? Squamous cell carcinoma of right thigh 08/02/2021  ? Giant cell arteritis ([place]) 07/11/2021  ? HLA B27 (HLA B27 positive) 11/16/2020  ? B12 deficiency 05/19/2020  ? Medication management 05/04/2019  ? Vitamin D deficiency 05/04/2019  ? History of basal cell carcinoma 02/09/2015  ? Psoriasis 02/09/2015  ? Testicular hypofunction 08/18/2014  ? DJD of shoulder 08/18/2014  ? Hypertension   ? Hyperlipidemia   ? Allergy   ? Asthma   ? Depression   ? GERD (gastroesophageal reflux disease)   ? ED (erectile dysfunction)   ? Plantar fasciitis 01/06/2012  ?  ?Past Medical History:  ?Diagnosis Date  ? Allergy   ? Anxiety   ? Asthma   ? Basal cell carcinoma   ? Depression   ? ED (erectile dysfunction)   ? GERD (gastroesophageal reflux disease)   ? Hyperlipidemia   ? Hypertension   ? Hypogonadism in male   ? Osteoarthritis   ? Temporal arteritis ([place])   ? Vitamin D deficiency   ?  ?Family History  ?Problem Relation Age of Onset  ? Uterine cancer Mother   ? Asthma Father   ? Kidney disease Father   ? Rheum arthritis Father   ? Aortic stenosis Father   ?  valve replacement  ? Ankylosing spondylitis Brother   ? Heart disease Brother   ? Cancer Paternal Grandmother   ? Heart attack Paternal Grandfather   ? Colon cancer Neg Hx   ? Colon polyps Neg Hx   ? Gallbladder disease Neg Hx   ? Esophageal cancer Neg Hx   ? Diabetes Neg Hx   ? ?Past Surgical History:  ?Procedure Laterality Date  ? HERNIA REPAIR Bilateral 1970  ? 61 yrs old, inguinal   ? LASIK    ? ORTHOPEDIC SURGERY Left   ? left shoulder, bone spur, Dr. Denice Bors  ? PTOSIS REPAIR Bilateral 11/2020  ? Dr. Toy Cookey - [facility]  ? scalp biopsy    ? basal cell   ?  VASECTOMY  2013  ? ?Social History  ? ?Social History Narrative  ? Not on file  ? ?Immunization History  ?Administered Date(s) Administered  ? Influenza Inj Mdck Quad Pf 04/24/2017  ? Influenza Inj Mdck Quad With Preservative 02/26/2018, 05/04/2020  ? Influenza Split 03/28/2013, 03/30/2014  ? Influenza, Seasonal, Injecte, Preservative Fre 05/04/2015  ? PFIZER Comirnaty(Gray Top)Covid-19 Tri-Sucrose Vaccine 03/21/2021  ? PFIZER(Purple Top)SARS-COV-2 Vaccination 06/24/2019, 07/15/2019, 06/10/2020  ? Tdap 02/20/2017  ?  ? ?Objective: ?Vital Signs: BP 130/68 (BP Location: Left Arm, Patient Position: Sitting, Cuff Size: Normal)   Pulse (!) 101   Resp 15   Ht 5' 11.5" (1.816 m)   Wt 188 lb (85.3 kg)   BMI 25.86 kg/m?   ? ?Physical Exam ?Vitals and nursing note reviewed.  ?Constitutional:   ?   Appearance: He is well-developed.  ?HENT:  ?   Head: Normocephalic and atraumatic.  ?Eyes:  ?   Conjunctiva/sclera: Conjunctivae normal.  ?   Pupils: Pupils are equal, round, and reactive to light.  ?Cardiovascular:  ?   Rate and Rhythm: Normal rate and regular rhythm.  ?   Heart sounds: Normal heart sounds.  ?Pulmonary:  ?   Effort: Pulmonary effort is normal.  ?   Breath sounds: Normal breath sounds.  ?Abdominal:  ?   General: Bowel sounds are normal.  ?   Palpations: Abdomen is soft.  ?Musculoskeletal:  ?   Cervical back: Normal range of motion and neck supple.  ?Skin: ?   General: Skin is warm and dry.  ?   Capillary Refill: Capillary refill takes less than 2 seconds.  ?Neurological:  ?   Mental Status: He is alert and oriented to person, place, and time.  ?Psychiatric:     ?   Behavior: Behavior normal.  ?  ? ?Musculoskeletal Exam: C-spine thoracic and lumbar spine were in good range of motion.  Shoulder joints, elbow joints, wrist joints, MCPs PIPs and DIPs and good range of motion with no synovitis.  Hip joints, knee joints, ankles, MTPs and PIPs with good range of motion with no synovitis.  No muscular weakness or  tenderness was noted. ? ?CDAI Exam: ?CDAI Score: -- ?Patient Global: --; Provider Global: -- ?Swollen: 0 ; Tender: 0  ?Joint Exam 09/12/2021  ? ?No joint exam has been documented for this visit  ? ?There is currently no information documented on the homunculus. Go to the Rheumatology activity and complete the homunculus joint exam. ? ?Investigation: ?No additional findings. ? ?Imaging: ?MR ANGIO CHEST W WO CONTRAST ? ?Result Date: 08/17/2021 ?CLINICAL DATA:  Evaluate for vasculitis EXAM: MRA CHEST WITH OR WITHOUT CONTRAST TECHNIQUE: Angiographic images of the chest were obtained using MRA technique without and with intravenous contrast.  CONTRAST:  10mL GADAVIST GADOBUTROL 1 MMOL/ML IV SOLN COMPARISON:  None. FINDINGS: VASCULAR Aorta: Conventional 3 vessel arch anatomy. No aneurysm, dissection, wall thickening or stenosis. Heart: The heart is normal in size. There is concentric hypertrophy of the left ventricular myocardium. No pericardial effusion. Pulmonary Arteries:  Normal caliber.  No evidence of central PE. Other: Vertebral and carotid arteries are tortuous. NON-VASCULAR Lungs/Pleura: No focal signal abnormality or abnormal enhancement. No pleural effusion. Mediastinum: No mass or lymphadenopathy. Upper abdomen: Unremarkable. Bones: No focal signal abnormality or abnormal enhancement. IMPRESSION: 1. No imaging evidence of vasculitis, aneurysm or arterial dissection. 2. Concentric hypertrophy of the left ventricular myocardium suggests the possibility of systemic arterial hypertension. 3. Mildly tortuous bilateral vertebral and internal carotid arteries. This is also suggestive of underlying hypertension. 4. No unexpected non vascular abnormality identified. Signed, Criselda Peaches, MD, [facility] Electronically Signed   By: Jacqulynn Cadet M.D.   On: 08/17/2021 07:31  ? ?VAS [place] AAA DUPLEX ? ?Result Date: 08/30/2021 ?ABDOMINAL AORTA STUDY Patient  Name:  Stephen Campbell [facility]  Date of Exam:   08/30/2021 Medical Rec #: [id number]      Accession #:    [id number] Date of Birth: 05-26-1961      Patient Gender: M Patient Age:   3 years Exam Location:  [facility]

## 2021-09-04 NOTE — Progress Notes (Signed)
Assessment and Plan: ? ?Diagnoses and all orders for this visit: ? ?Essential hypertension ?- Increase Olmesartan to 40 mg QD, DASH diet, exercise and monitor at home. Call if greater than 120/70.  Follow up in 2 weeks with log. ?- Monitor pulse if continues to be elevated may need to add a beta blocker ?-     olmesartan (BENICAR) 40 MG tablet; Take 1 tablet (40 mg total) by mouth daily. ? ?Paresthesia of both hands ?Will refer to PT for possible thoracic outlet syndrome related to job. If symptoms do not improve with PT will refer to neurology ?-     Ambulatory referral to Physical Therapy ? ?Temporal Arteritis ?Continue medications ?Continue to follow with Dr. [name] ?  ? ? ? ?Further disposition pending results of labs. Discussed med's effects and SE's.   ?Over 30 minutes of exam, counseling, chart review, and critical decision making was performed.  ? ?Future Appointments  ?Date Time Provider [place]  ?09/12/2021  1:45 PM Bo Merino, MD [facility] None  ?11/29/2021  9:30 AM Liane Comber, NP [facility] None  ?05/27/2022  9:00 AM Liane Comber, NP [facility] None  ? ? ?------------------------------------------------------------------------------------------------------------------ ? ? ?HPI ?BP 122/78   Pulse 97   Temp (!) 97 ?F (36.1 ?C)   Wt 184 lb 3.2 oz (83.6 kg)   SpO2 99%   BMI 24.98 kg/m?  ? ?He has been on Prednisone since January started at 50 mg daily and is now on 35 mg daily. He is being treated for Temporal arteritis. He is on Actemra '162mg'$  every 7 days. This is a new injectable therapy he started 08/15/21 ? ?He is having tingling in his hands , worse on pinky side. Worse at night when sleeping. He will get tingling in hands bilateral   Denies burning, coldness , pain. This has been occurring x several years. 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  This occurred prior to temporal arteritis ? ? ?61 y.o.male presents for blood pressure evaluation. His  BP has been running 120-160/70-88 at home.  Denies blurred vision, chest pain, dizziness ?He has had periods of tachycardia occurring typically around 2 pm since being on steroids, will resolve as day progresses and with relaxation techniques. ?BP Readings from Last 3 Encounters:  ?09/05/21 122/78  ?08/23/21 (!) 144/98  ?08/15/21 127/81  ? ?Pulse Readings from Last 3 Encounters:  ?09/05/21 97  ?08/23/21 78  ?08/15/21 75  ?  ? ?He is currently on Olmesartan 20 mg daily. Would like tighter control of 120/70 if possible, MRA 08/17/21 revealed: ?IMPRESSION: ?1. No imaging evidence of vasculitis, aneurysm or arterial ?dissection. ?2. Concentric hypertrophy of the left ventricular myocardium ?suggests the possibility of systemic arterial hypertension. ?3. Mildly tortuous bilateral vertebral and internal carotid ?arteries. This is also suggestive of underlying hypertension. ?4. No unexpected non vascular abnormality identified. ? ?  ? ?Past Medical History:  ?Diagnosis Date  ? Allergy   ? Anxiety   ? Asthma   ? Basal cell carcinoma   ? Depression   ? ED (erectile dysfunction)   ? GERD (gastroesophageal reflux disease)   ? Hyperlipidemia   ? Hypertension   ? Hypogonadism in male   ? Osteoarthritis   ? Vitamin D deficiency   ?  ? ?Allergies  ?Allergen Reactions  ? Enalapril Cough  ? ? ?Current Outpatient Medications on File Prior to Visit  ?Medication Sig  ? albuterol (VENTOLIN HFA) 108 (90 BASE) MCG/ACT inhaler Inhale 2  puffs into the lungs every 4 (four) hours as needed for wheezing or shortness of breath.  ? ALPRAZolam (XANAX) 0.5 MG tablet Take 1 tablet (0.5 mg total) by mouth 3 (three) times daily as needed for sleep or anxiety.  ? atorvastatin (LIPITOR) 10 MG tablet TAKE 1 TABLET ONCE DAILY FOR CHOLESTEROL.  ? diclofenac sodium (VOLTAREN) 1 % GEL 3 g to 3 large joints three times daily  ? famotidine (PEPCID) 40 MG tablet TAKE 1 TABLET AT BEDTIME FOR ACID REFLUX AND INDIGESTION.  ? levocetirizine (XYZAL) 5 MG tablet Take  5 mg by mouth daily.  ? MAGNESIUM PO Take by mouth daily.  ? meloxicam (MOBIC) 15 MG tablet Take 1/2 to 1 tablet  Daily  with Food for Pain & Inflammation & try limit to 5 days/week to Avoid Kidney Damage  ? NATESTO 5.5 MG/ACT GEL use 1 pump in each nostril three times daily *wash hands after use  ? olmesartan (BENICAR) 20 MG tablet Take  1 tablet  Daily  for BP  ? omeprazole (PRILOSEC) 40 MG capsule Take  1 capsule  Daily  to Prevent Heartburn & Indigestion  ? predniSONE (DELTASONE) 10 MG tablet Take 1.5 tablets (15 mg total) by mouth daily with breakfast. Take with 20 mg to equal 35 mg daily.  ? predniSONE (DELTASONE) 20 MG tablet Take 1 tablet (20 mg total) by mouth daily with breakfast.  ? Tocilizumab (ACTEMRA ACTPEN) 162 MG/0.9ML SOAJ Inject 162 mg into the skin every 7 (seven) days.  ? triamcinolone cream (KENALOG) 0.1 % Apply 1 application topically 2 (two) times daily.  ? vardenafil (LEVITRA) 20 MG tablet Take 1 tablet Daily if Needed for XXXX                                            /                    TAKE ONE TABLET BY MOUTH  ? ?No current facility-administered medications on file prior to visit.  ? ? ?ROS: all negative except above.  ? ?Physical Exam: ? ?BP 122/78   Pulse 97   Temp (!) 97 ?F (36.1 ?C)   Wt 184 lb 3.2 oz (83.6 kg)   SpO2 99%   BMI 24.98 kg/m?  ? ?General Appearance: Well nourished, in no apparent distress. ?Eyes: PERRLA, EOMs, conjunctiva no swelling or erythema ?Sinuses: No Frontal/maxillary tenderness ?ENT/Mouth: Ext aud canals clear, TMs without erythema, bulging. No erythema, swelling, or exudate on post pharynx.  Tonsils not swollen or erythematous. Hearing normal.  ?Neck: Supple, thyroid normal.  ?Respiratory: Respiratory effort normal, BS equal bilaterally without rales, rhonchi, wheezing or stridor.  ?Cardio: RRR with no MRGs. Brisk peripheral pulses without edema.  ?Abdomen: Soft, + BS.  Non tender, no guarding, rebound, hernias, masses. ?Lymphatics: Non tender without  lymphadenopathy.  ?Musculoskeletal: Full ROM, 5/5 strength, normal gait.  ?Skin: Warm, dry without rashes, lesions, ecchymosis.  ?Neuro: Cranial nerves intact. Normal muscle tone, no cerebellar symptoms. Sensation intact.  ?Psych: Awake and oriented X 3, normal affect, Insight and Judgment appropriate.  ?  ? ?Magda Bernheim, NP ?4:09 PM ?[facility] ? ?

## 2021-09-05 ENCOUNTER — Ambulatory Visit (INDEPENDENT_AMBULATORY_CARE_PROVIDER_SITE_OTHER): Payer: No Typology Code available for payment source | Admitting: Nurse Practitioner

## 2021-09-05 ENCOUNTER — Other Ambulatory Visit: Payer: Self-pay

## 2021-09-05 ENCOUNTER — Encounter: Payer: Self-pay | Admitting: Nurse Practitioner

## 2021-09-05 VITALS — BP 122/78 | HR 97 | Temp 97.0°F | Wt 184.2 lb

## 2021-09-05 DIAGNOSIS — I10 Essential (primary) hypertension: Secondary | ICD-10-CM

## 2021-09-05 DIAGNOSIS — G540 Brachial plexus disorders: Secondary | ICD-10-CM | POA: Diagnosis not present

## 2021-09-05 DIAGNOSIS — R202 Paresthesia of skin: Secondary | ICD-10-CM

## 2021-09-05 DIAGNOSIS — M316 Other giant cell arteritis: Secondary | ICD-10-CM

## 2021-09-05 MED ORDER — OLMESARTAN MEDOXOMIL 40 MG PO TABS: 40.0000 mg | ORAL_TABLET | Freq: Every day | ORAL | 11 refills | Status: DC

## 2021-09-12 ENCOUNTER — Ambulatory Visit (INDEPENDENT_AMBULATORY_CARE_PROVIDER_SITE_OTHER): Payer: No Typology Code available for payment source | Admitting: Rheumatology

## 2021-09-12 ENCOUNTER — Encounter: Payer: Self-pay | Admitting: Rheumatology

## 2021-09-12 VITALS — BP 130/68 | HR 101 | Resp 15 | Ht 71.5 in | Wt 188.0 lb

## 2021-09-12 DIAGNOSIS — L409 Psoriasis, unspecified: Secondary | ICD-10-CM

## 2021-09-12 DIAGNOSIS — Z8639 Personal history of other endocrine, nutritional and metabolic disease: Secondary | ICD-10-CM

## 2021-09-12 DIAGNOSIS — Z1589 Genetic susceptibility to other disease: Secondary | ICD-10-CM

## 2021-09-12 DIAGNOSIS — M316 Other giant cell arteritis: Secondary | ICD-10-CM | POA: Diagnosis not present

## 2021-09-12 DIAGNOSIS — M2241 Chondromalacia patellae, right knee: Secondary | ICD-10-CM

## 2021-09-12 DIAGNOSIS — R202 Paresthesia of skin: Secondary | ICD-10-CM

## 2021-09-12 DIAGNOSIS — M19071 Primary osteoarthritis, right ankle and foot: Secondary | ICD-10-CM

## 2021-09-12 DIAGNOSIS — Z79899 Other long term (current) drug therapy: Secondary | ICD-10-CM

## 2021-09-12 DIAGNOSIS — M19041 Primary osteoarthritis, right hand: Secondary | ICD-10-CM

## 2021-09-12 DIAGNOSIS — R700 Elevated erythrocyte sedimentation rate: Secondary | ICD-10-CM

## 2021-09-12 DIAGNOSIS — M19042 Primary osteoarthritis, left hand: Secondary | ICD-10-CM

## 2021-09-12 DIAGNOSIS — M19072 Primary osteoarthritis, left ankle and foot: Secondary | ICD-10-CM

## 2021-09-12 DIAGNOSIS — Z8679 Personal history of other diseases of the circulatory system: Secondary | ICD-10-CM

## 2021-09-12 DIAGNOSIS — Z8719 Personal history of other diseases of the digestive system: Secondary | ICD-10-CM

## 2021-09-12 DIAGNOSIS — M7062 Trochanteric bursitis, left hip: Secondary | ICD-10-CM

## 2021-09-12 DIAGNOSIS — M51369 Other intervertebral disc degeneration, lumbar region without mention of lumbar back pain or lower extremity pain: Secondary | ICD-10-CM

## 2021-09-12 DIAGNOSIS — Z8709 Personal history of other diseases of the respiratory system: Secondary | ICD-10-CM

## 2021-09-12 DIAGNOSIS — Z8659 Personal history of other mental and behavioral disorders: Secondary | ICD-10-CM

## 2021-09-12 DIAGNOSIS — M5136 Other intervertebral disc degeneration, lumbar region: Secondary | ICD-10-CM

## 2021-09-12 DIAGNOSIS — Z7952 Long term (current) use of systemic steroids: Secondary | ICD-10-CM

## 2021-09-12 DIAGNOSIS — Z85828 Personal history of other malignant neoplasm of skin: Secondary | ICD-10-CM

## 2021-09-12 DIAGNOSIS — M7731 Calcaneal spur, right foot: Secondary | ICD-10-CM

## 2021-09-12 DIAGNOSIS — M722 Plantar fascial fibromatosis: Secondary | ICD-10-CM

## 2021-09-12 DIAGNOSIS — M7732 Calcaneal spur, left foot: Secondary | ICD-10-CM

## 2021-09-12 DIAGNOSIS — M7061 Trochanteric bursitis, right hip: Secondary | ICD-10-CM

## 2021-09-12 NOTE — Patient Instructions (Signed)
Standing Labs ?We placed an order today for your standing lab work.  ? ?Please have your standing labs drawn in June and every 3 months  ? ?If possible, please have your labs drawn 2 weeks prior to your appointment so that the provider can discuss your results at your appointment. ? ?Please note that you may see your imaging and lab results in MyChart before we have reviewed them. ?We may be awaiting multiple results to interpret others before contacting you. ?Please allow our office up to 72 hours to thoroughly review all of the results before contacting the office for clarification of your results. ? ?We have open lab daily: ?Monday through Thursday from 1:30-4:30 PM and Friday from 1:30-4:00 PM ?at the office of Dr. Orlo Brickle, [facility].   ?Please be advised, all patients with office appointments requiring lab work will take precedent over walk-in lab work.  ?If possible, please come for your lab work on Monday and Friday afternoons, as you may experience shorter wait times. ?The office is located at [address], [address], [place], [place] [address] ?No appointment is necessary.   ?Labs are drawn by [facility]. Please bring your co-pay at the time of your lab draw.  You may receive a bill from [facility] for your lab work. ? ?Please note if you are on Hydroxychloroquine and and an order has been placed for a Hydroxychloroquine level, you will need to have it drawn 4 hours or more after your last dose. ? ?If you wish to have your labs drawn at another location, please call the office 24 hours in advance to send orders. ? ?If you have any questions regarding directions or hours of operation,  ?please call [phone].   ?As a reminder, please drink plenty of water prior to coming for your lab work. Thanks! ? ?

## 2021-09-13 LAB — COMPLETE METABOLIC PANEL WITH GFR
AG Ratio: 2 (calc) (ref 1.0–2.5)
ALT: 50 U/L — ABNORMAL HIGH (ref 9–46)
AST: 30 U/L (ref 10–35)
Albumin: 4.3 g/dL (ref 3.6–5.1)
Alkaline phosphatase (APISO): 28 U/L — ABNORMAL LOW (ref 35–144)
BUN: 19 mg/dL (ref 7–25)
CO2: 24 mmol/L (ref 20–32)
Calcium: 9.4 mg/dL (ref 8.6–10.3)
Chloride: 103 mmol/L (ref 98–110)
Creat: 1.06 mg/dL (ref 0.70–1.30)
Globulin: 2.2 g/dL (calc) (ref 1.9–3.7)
Glucose, Bld: 151 mg/dL — ABNORMAL HIGH (ref 65–99)
Potassium: 4.5 mmol/L (ref 3.5–5.3)
Sodium: 139 mmol/L (ref 135–146)
Total Bilirubin: 0.9 mg/dL (ref 0.2–1.2)
Total Protein: 6.5 g/dL (ref 6.1–8.1)
eGFR: 81 mL/min/{1.73_m2} (ref >=60)

## 2021-09-13 LAB — CBC WITH DIFFERENTIAL/PLATELET
Absolute Monocytes: 159 cells/uL — ABNORMAL LOW (ref 200–950)
Basophils Absolute: 21 cells/uL (ref 0–200)
Basophils Relative: 0.2 %
Eosinophils Absolute: 21 cells/uL (ref 15–500)
Eosinophils Relative: 0.2 %
HCT: 43.4 % (ref 38.5–50.0)
Hemoglobin: 14.6 g/dL (ref 13.2–17.1)
Lymphs Abs: 541 cells/uL — ABNORMAL LOW (ref 850–3900)
MCH: 31.8 pg (ref 27.0–33.0)
MCHC: 33.6 g/dL (ref 32.0–36.0)
MCV: 94.6 fL (ref 80.0–100.0)
MPV: 11.1 fL (ref 7.5–12.5)
Monocytes Relative: 1.5 %
Neutro Abs: 9858 cells/uL — ABNORMAL HIGH (ref 1500–7800)
Neutrophils Relative %: 93 %
Platelets: 209 10*3/uL (ref 140–400)
RBC: 4.59 10*6/uL (ref 4.20–5.80)
RDW: 13.1 % (ref 11.0–15.0)
Total Lymphocyte: 5.1 %
WBC: 10.6 10*3/uL (ref 3.8–10.8)

## 2021-09-13 LAB — SEDIMENTATION RATE: Sed Rate: 2 mm/h (ref 0–20)

## 2021-09-13 LAB — HEMOGLOBIN A1C
Hgb A1c MFr Bld: 5.6 % of total Hgb (ref <5.7)
Mean Plasma Glucose: 114 mg/dL
eAG (mmol/L): 6.3 mmol/L

## 2021-09-13 NOTE — Progress Notes (Signed)
Lymphocyte count is low due to immunosuppression.  Glucose is elevated.  Liver function ALT is mildly elevated.  Please advise to avoid all NSAIDs and alcohol use.  Sed rate is normal.  Hemoglobin A1c is 5.6 which is normal but increased from prior value.  Decrease prednisone by 5 mg every week as discussed until he reaches prednisone 20 mg p.o. daily.

## 2021-09-19 ENCOUNTER — Ambulatory Visit: Payer: PRIVATE HEALTH INSURANCE | Admitting: Adult Health

## 2021-09-19 ENCOUNTER — Encounter: Payer: Self-pay | Admitting: Adult Health

## 2021-09-19 VITALS — BP 148/88 | HR 80 | Temp 99.7°F | Wt 189.0 lb

## 2021-09-19 DIAGNOSIS — I517 Cardiomegaly: Secondary | ICD-10-CM | POA: Insufficient documentation

## 2021-09-19 DIAGNOSIS — I10 Essential (primary) hypertension: Secondary | ICD-10-CM

## 2021-09-19 NOTE — Progress Notes (Signed)
Assessment and Plan: ? ?Diagnoses and all orders for this visit: ? ?Essential hypertension ?- labile, ranging 110-140s/70-80s  ?- monitoring closely, feels improving with tapering down on steroid ?- discussed ideally end goal average <120/80 aggressive control due to LVH ?- Monitor pulse if continues to be elevated may need to add a beta blocker, after discussion will hold off for now per patient preference as he tapers off of steroid, contact office if remains labile 2-4 weeks after getting off if remains above goal  ?- reevaluate in 2 months  ?-     olmesartan (BENICAR) 40 MG tablet; Take 1 tablet (40 mg total) by mouth daily. ? ?Paresthesia of both hands ?Has been referred to PT, pending ?Neg phalen's ?If symptoms do not improve with PT possible cervical MRI vs referral for neuro  ? ?Temporal Arteritis ?Continue medications, doing well with prednisone taper  ?Continue to follow with Dr. [name] ? ? ?Further disposition pending results of labs. Discussed med's effects and SE's.   ?Over 15 minutes of exam, counseling, chart review, and critical decision making was performed.  ? ?Future Appointments  ?Date Time Provider [place]  ?11/01/2021 10:45 AM Bo Merino, MD [facility] None  ?11/29/2021  9:30 AM Liane Comber, NP [facility] None  ?05/27/2022  9:00 AM Liane Comber, NP [facility] None  ? ? ?------------------------------------------------------------------------------------------------------------------ ? ? ?HPI ?BP (!) 148/88   Pulse 80   Temp 99.7 ?F (37.6 ?C)   Wt 189 lb (85.7 kg)   SpO2 99%   BMI 25.99 kg/m?  ?61 y.o. male presents for 2 week follow up on htn after med dose adjustment.  ? ?He was dx with temporal arteritis in Jan 2023, following with Dr. [name] with prednisone taper, down to 25 mg this week and also started actemra 162 mg q7d 08/15/2021, doing well thus far.  ? ?He had MRA as part of workup on 08/16/2021 that showed Concentric hypertrophy of the left ventricular  myocardium suggestive of systemic arterial hypertension, also mildly tortuous bilateral vertebral and internal carotid arteries. We requested follow up for tighter BP control goal, he admitted to labile and has been monitoring more closely several times a day. Olmesartan was increased from 20 mg to 40 mg. Presents with log showing labile values, 110s/70s-140s/80s.  ? ?61 y.o.male presents for blood pressure evaluation. His BP has been running 120-160/70-88 at home.  Denies blurred vision, chest pain, dizziness ?He has had periods of tachycardia occurring typically around 2 pm since being on steroids, will resolve as day progresses and with relaxation techniques. ?BP Readings from Last 3 Encounters:  ?09/19/21 (!) 148/88  ?09/12/21 130/68  ?09/05/21 122/78  ? ?Lab Results  ?Component Value Date  ? NA 139 09/12/2021  ? K 4.5 09/12/2021  ? CL 103 09/12/2021  ? CO2 24 09/12/2021  ? GLUCOSE 151 (H) 09/12/2021  ? BUN 19 09/12/2021  ? CREATININE 1.06 09/12/2021  ? CALCIUM 9.4 09/12/2021  ? GFRAA 101 11/16/2020  ? GFRNONAA 87 11/16/2020  ? ?Bil hand tingling, historically intermittent, newly persistent; pending PT for possible thoracic outlet vs cervical related to his job.  ? ?Past Medical History:  ?Diagnosis Date  ? Allergy   ? Anxiety   ? Asthma   ? Basal cell carcinoma   ? Depression   ? ED (erectile dysfunction)   ? GERD (gastroesophageal reflux disease)   ? Hyperlipidemia   ? Hypertension   ? Hypogonadism in male   ? Osteoarthritis   ? Temporal arteritis ([place])   ?  Vitamin D deficiency   ?  ? ?Allergies  ?Allergen Reactions  ? Enalapril Cough  ? ? ?Current Outpatient Medications on File Prior to Visit  ?Medication Sig  ? albuterol (VENTOLIN HFA) 108 (90 BASE) MCG/ACT inhaler Inhale 2 puffs into the lungs every 4 (four) hours as needed for wheezing or shortness of breath.  ? ALPRAZolam (XANAX) 0.5 MG tablet Take 1 tablet (0.5 mg total) by mouth 3 (three) times daily as needed for sleep or anxiety.  ? atorvastatin  (LIPITOR) 10 MG tablet TAKE 1 TABLET ONCE DAILY FOR CHOLESTEROL.  ? azelastine (ASTELIN) 0.1 % nasal spray Place 1 spray into both nostrils 2 (two) times daily.  ? diclofenac sodium (VOLTAREN) 1 % GEL 3 g to 3 large joints three times daily  ? famotidine (PEPCID) 40 MG tablet TAKE 1 TABLET AT BEDTIME FOR ACID REFLUX AND INDIGESTION.  ? levocetirizine (XYZAL) 5 MG tablet Take 5 mg by mouth daily.  ? MAGNESIUM PO Take by mouth daily.  ? NATESTO 5.5 MG/ACT GEL use 1 pump in each nostril three times daily *wash hands after use  ? olmesartan (BENICAR) 40 MG tablet Take 1 tablet (40 mg total) by mouth daily.  ? omeprazole (PRILOSEC) 40 MG capsule Take  1 capsule  Daily  to Prevent Heartburn & Indigestion  ? Polyethylene Glycol 3350 (MIRALAX PO) Take by mouth.  ? predniSONE (DELTASONE) 10 MG tablet Take 1.5 tablets (15 mg total) by mouth daily with breakfast. Take with 20 mg to equal 35 mg daily.  ? predniSONE (DELTASONE) 20 MG tablet Take 1 tablet (20 mg total) by mouth daily with breakfast.  ? Tocilizumab (ACTEMRA ACTPEN) 162 MG/0.9ML SOAJ Inject 162 mg into the skin every 7 (seven) days.  ? triamcinolone cream (KENALOG) 0.1 % Apply 1 application topically 2 (two) times daily. (Patient not taking: Reported on 09/12/2021)  ? vardenafil (LEVITRA) 20 MG tablet Take 1 tablet Daily if Needed for XXXX                                            /                    TAKE ONE TABLET BY MOUTH  ? ?No current facility-administered medications on file prior to visit.  ? ? ?ROS: all negative except above.  ? ?Physical Exam: ? ?BP (!) 148/88   Pulse 80   Temp 99.7 ?F (37.6 ?C)   Wt 189 lb (85.7 kg)   SpO2 99%   BMI 25.99 kg/m?  ? ?General Appearance: Well nourished, in no apparent distress. ?Eyes: PERRLA, EOMs, conjunctiva no swelling or erythema ?ENT/Mouth: Ext aud canals clear, TMs without erythema, bulging. No erythema, swelling, or exudate on post pharynx.  Tonsils not swollen or erythematous. Hearing normal.  ?Neck: Supple,  thyroid normal.  ?Respiratory: Respiratory effort normal, BS equal bilaterally without rales, rhonchi, wheezing or stridor.  ?Cardio: RRR with no MRGs. Brisk peripheral pulses without edema.  ?Abdomen: Soft, + BS.  Non tender, no guarding, rebound, hernias, masses. ?Lymphatics: Non tender without lymphadenopathy.  ?Musculoskeletal: Full ROM, 5/5 strength, normal gait.  ?Skin: Warm, dry without rashes, lesions, ecchymosis.  ?Neuro: Cranial nerves intact. Normal muscle tone, no cerebellar symptoms. Sensation intact. Neg phalen's.  ?Psych: Awake and oriented X 3, normal affect, Insight and Judgment appropriate.  ?  ?Izora Ribas, NP ?5:44  PM ?[facility] ? ?

## 2021-10-04 ENCOUNTER — Other Ambulatory Visit: Payer: Self-pay | Admitting: *Deleted

## 2021-10-04 MED ORDER — PREDNISONE 5 MG PO TABS: ORAL_TABLET | ORAL | 0 refills | Status: DC

## 2021-10-08 ENCOUNTER — Other Ambulatory Visit: Payer: Self-pay | Admitting: Internal Medicine

## 2021-10-08 DIAGNOSIS — K2100 Gastro-esophageal reflux disease with esophagitis, without bleeding: Secondary | ICD-10-CM

## 2021-10-18 NOTE — Progress Notes (Signed)
Office Visit Note  Patient: Stephen Campbell             Date of Birth: 1960/09/26           MRN: [id number]             PCP: Unk Pinto, MD Referring: Unk Pinto, MD Visit Date: 11/01/2021 Occupation: @GUAROCC @  Subjective:  No chief complaint on file.   History of Present Illness: Stephen Campbell is a 61 y.o. male with history of temporal arteritis, osteoarthritis and psoriasis.  He has been tapering prednisone gradually.  He is currently on prednisone 15 mg p.o. daily.  He states in March she had 1 episode of headache lasting for 24 hours.  And he had few mild headaches since then.  About 2 weeks ago he had headache lasting for almost 1 week.  He states the pain was severe in the both temporal regions and he had to use an ice pack.  The headache gradually resolved.  He reports only mild headache today.  He continues to have some insomnia.  He has been recently noticing some discomfort in his right shoulder, right elbow and right trochanteric region.  He continues to have [place] fasciitis which she relates to his work.  Activities of Daily Living:  Patient reports morning stiffness for several hours.   Patient Denies nocturnal pain.  Difficulty dressing/grooming: Denies Difficulty climbing stairs: Denies Difficulty getting out of chair: Denies Difficulty using hands for taps, buttons, cutlery, and/or writing: Denies  Review of Systems  Constitutional:  Positive for fatigue.  HENT:  Negative for mouth sores, mouth dryness and nose dryness.   Eyes:  Positive for dryness. Negative for pain and itching.  Respiratory:  Negative for shortness of breath and difficulty breathing.   Cardiovascular:  Positive for palpitations. Negative for chest pain.  Gastrointestinal:  Positive for constipation. Negative for blood in stool and diarrhea.  Endocrine: Negative for increased urination.  Genitourinary:  Negative for difficulty urinating.  Musculoskeletal:  Positive for joint pain, joint  pain, myalgias, morning stiffness, muscle tenderness and myalgias. Negative for joint swelling.  Skin:  Negative for color change, rash and redness.  Allergic/Immunologic: Negative for susceptible to infections.  Neurological:  Positive for numbness, headaches and parasthesias. Negative for dizziness and memory loss.  Hematological:  Negative for bruising/bleeding tendency.  Psychiatric/Behavioral:  Negative for confusion.    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   Giant cell arteritis ([place]) 07/11/2021   HLA B27 (HLA B27 positive) 11/16/2020   B12 deficiency 05/19/2020   Medication management 05/04/2019   Vitamin D deficiency 05/04/2019   History of basal cell carcinoma 02/09/2015   Psoriasis 02/09/2015   Testicular hypofunction 08/18/2014   DJD of shoulder 08/18/2014   Hypertension    Hyperlipidemia    Allergy    Asthma    Depression    GERD (gastroesophageal reflux disease)    ED (erectile dysfunction)    Plantar fasciitis 01/06/2012    Past Medical History:  Diagnosis Date   Allergy    Anxiety    Asthma    Basal cell carcinoma    Depression    ED (erectile dysfunction)    GERD (gastroesophageal reflux disease)    Hyperlipidemia    Hypertension    Hypogonadism in male    Osteoarthritis    Temporal arteritis ([place])  Vitamin D deficiency     Family History  Problem Relation Age of Onset   Uterine cancer Mother    Asthma Father    Kidney disease Father    Rheum arthritis Father    Aortic stenosis Father        valve replacement   Ankylosing spondylitis Brother    Heart disease Brother    Cancer Paternal Grandmother    Heart attack Paternal Grandfather    Colon cancer Neg Hx    Colon polyps Neg Hx    Gallbladder disease Neg Hx    Esophageal cancer Neg Hx    Diabetes Neg Hx    Past Surgical History:  Procedure  Laterality Date   HERNIA REPAIR Bilateral 1970   61 yrs old, inguinal    LASIK     ORTHOPEDIC SURGERY Left    left shoulder, bone spur, Dr. Denice Bors   PTOSIS REPAIR Bilateral 11/2020   Dr. Toy Cookey - [facility]   scalp biopsy     basal cell    VASECTOMY  2013   Social History   Social History Narrative   Not on file   Immunization History  Administered Date(s) Administered   Influenza Inj Mdck Quad Pf 04/24/2017   Influenza Inj Mdck Quad With Preservative 02/26/2018, 05/04/2020   Influenza Split 03/28/2013, 03/30/2014   Influenza, Seasonal, Injecte, Preservative Fre 05/04/2015   PFIZER Comirnaty(Gray Top)Covid-19 Tri-Sucrose Vaccine 03/21/2021   PFIZER(Purple Top)SARS-COV-2 Vaccination 06/24/2019, 07/15/2019, 06/10/2020   Tdap 02/20/2017     Objective: Vital Signs: BP 129/83 (BP Location: Left Arm, Patient Position: Sitting, Cuff Size: Normal)   Pulse 73   Ht 5' 11.5" (1.816 m)   Wt 187 lb (84.8 kg)   BMI 25.72 kg/m    Physical Exam Vitals and nursing note reviewed.  Constitutional:      Appearance: He is well-developed.  HENT:     Head: Normocephalic and atraumatic.  Eyes:     Conjunctiva/sclera: Conjunctivae normal.     Pupils: Pupils are equal, round, and reactive to light.  Cardiovascular:     Rate and Rhythm: Normal rate and regular rhythm.     Heart sounds: Normal heart sounds.  Pulmonary:     Effort: Pulmonary effort is normal.     Breath sounds: Normal breath sounds.  Abdominal:     General: Bowel sounds are normal.     Palpations: Abdomen is soft.  Musculoskeletal:     Cervical back: Normal range of motion and neck supple.  Skin:    General: Skin is warm and dry.     Capillary Refill: Capillary refill takes less than 2 seconds.  Neurological:     Mental Status: He is alert and oriented to person, place, and time.     Comments: He had tenderness over bilateral temporal region.  Psychiatric:        Behavior: Behavior normal.      Musculoskeletal Exam: C-spine thoracic lumbar spine with good range of motion.  Shoulder joints, elbow joints, wrist joints, MCPs PIPs and DIPs with good range of motion with no synovitis.  Hip joints knee joints ankles and MTPs with good range of motion.  He had tenderness over right trochanteric bursa.  CDAI Exam: CDAI Score: -- Patient Global: --; Provider Global: -- Swollen: --; Tender: -- Joint Exam 11/01/2021   No joint exam has been documented for this visit   There is currently no information documented on the homunculus. Go to the Rheumatology activity and complete the  homunculus joint exam.  Investigation: No additional findings.  Imaging: No results found.  Recent Labs: Lab Results  Component Value Date   WBC 10.6 09/12/2021   HGB 14.6 09/12/2021   PLT 209 09/12/2021   NA 139 09/12/2021   K 4.5 09/12/2021   CL 103 09/12/2021   CO2 24 09/12/2021   GLUCOSE 151 (H) 09/12/2021   BUN 19 09/12/2021   CREATININE 1.06 09/12/2021   BILITOT 0.9 09/12/2021   ALKPHOS 39 (L) 08/15/2016   AST 30 09/12/2021   ALT 50 (H) 09/12/2021   PROT 6.5 09/12/2021   ALBUMIN 4.4 08/15/2016   CALCIUM 9.4 09/12/2021   GFRAA 101 11/16/2020   QFTBGOLDPLUS NEGATIVE 08/09/2021    Speciality Comments: Actemra started 08/15/21  Procedures:  No procedures performed Allergies: Enalapril   Assessment / Plan:     Visit Diagnoses: Temporal arteritis ([place]) - he presented with temporal headaches and January 2023.  He was found to have elevated sedimentation rate and CRP.  -He has been on Actemra 162 mg subcu every 7 days since August 15, 2021.  He is currently on prednisone taper at 15 mg p.o. daily.  He had 1 episode of headache in March and 2 weeks ago he had headaches almost every day for 1 week.  He had mild and plan: Sedimentation rate.  If his sed rate is normal he should taper prednisone to 12.5 mg p.o. daily.  He was advised to taper prednisone by 2.5 mg daily until he reaches 10 mg p.o.  daily.  Then we will try to taper 1 mg every 2 weeks.  High risk medication use - Actemra 162 mg into the skin every 7 (seven) days. prednisone  - Plan: CBC with Differential/Platelet, COMPLETE METABOLIC PANEL WITH GFR today and then every 3 months to monitor for drug toxicity.  Information regarding immunization was placed in the AVS.  He was also advised to stop Actemra in case he develops an infection and resume after the infection resolves.  Long term (current) use of systemic steroids-dietary modifications and exercise was emphasized.  Psoriasis-some nail dystrophy and a patch of psoriasis was noted on the right shin.  HLA B27 positive  Primary osteoarthritis of both hands-he has some stiffness in his hands but no synovitis was noted.  He has not tried physical therapy yet.  Paresthesia of both hands  Trochanteric bursitis of both hips-he had tenderness over right trochanteric bursa.  IT band stretches were discussed.  Chondromalacia patellae, right knee  Primary osteoarthritis of both feet-he continues to have some discomfort in his feet.  Plantar fasciitis-he relates to his work.  Bilateral calcaneal spurs  DDD (degenerative disc disease), lumbar-he has some stiffness in his lumbar spine.  History of hypertension-blood pressure was normal today.  History of gastroesophageal reflux (GERD)  History of hypercholesterolemia  History of anxiety-he notices increased anxiety and depression.  He states he will start seeing his counselor again.  I also discussed possible use of Lexapro but he would like to hold off at this point.  We will try medication and relaxation.  History of asthma  History of basal cell carcinoma  Orders: Orders Placed This Encounter  Procedures   CBC with Differential/Platelet   COMPLETE METABOLIC PANEL WITH GFR   Sedimentation rate   No orders of the defined types were placed in this encounter.    Follow-Up Instructions: Return in about 3  months (around 02/01/2022) for Temporal arteritis.   Bo Merino, MD  Note - This  record has been created using [employer].  Chart creation errors have been sought, but may not always  have been located. Such creation errors do not reflect on  the standard of medical care.

## 2021-10-27 ENCOUNTER — Other Ambulatory Visit: Payer: Self-pay | Admitting: Rheumatology

## 2021-10-27 DIAGNOSIS — Z79899 Other long term (current) drug therapy: Secondary | ICD-10-CM

## 2021-10-27 DIAGNOSIS — M316 Other giant cell arteritis: Secondary | ICD-10-CM

## 2021-10-28 NOTE — Telephone Encounter (Signed)
Next Visit: 11/01/2021 ? ?Last Visit: 09/12/2021 ? ?Last Fill: 08/15/2021 ? ?KY:[id number] arteritis  ? ?Current Dose per office note 09/12/2021: Actemra 162 mg into the skin every 7 (seven) days. ? ?Labs: 09/12/2021 Lymphocyte count is low due to immunosuppression.  Glucose is elevated.  Liver function ALT is mildly elevated.   ? ?TB Gold: 08/09/2021 Neg   ? ?Okay to refill Actemra?  ?

## 2021-11-01 ENCOUNTER — Encounter: Payer: Self-pay | Admitting: Rheumatology

## 2021-11-01 ENCOUNTER — Ambulatory Visit (INDEPENDENT_AMBULATORY_CARE_PROVIDER_SITE_OTHER): Payer: No Typology Code available for payment source | Admitting: Rheumatology

## 2021-11-01 VITALS — BP 129/83 | HR 73 | Ht 71.5 in | Wt 187.0 lb

## 2021-11-01 DIAGNOSIS — M316 Other giant cell arteritis: Secondary | ICD-10-CM

## 2021-11-01 DIAGNOSIS — Z8719 Personal history of other diseases of the digestive system: Secondary | ICD-10-CM

## 2021-11-01 DIAGNOSIS — M19071 Primary osteoarthritis, right ankle and foot: Secondary | ICD-10-CM

## 2021-11-01 DIAGNOSIS — M19041 Primary osteoarthritis, right hand: Secondary | ICD-10-CM

## 2021-11-01 DIAGNOSIS — L409 Psoriasis, unspecified: Secondary | ICD-10-CM

## 2021-11-01 DIAGNOSIS — Z8659 Personal history of other mental and behavioral disorders: Secondary | ICD-10-CM

## 2021-11-01 DIAGNOSIS — M7061 Trochanteric bursitis, right hip: Secondary | ICD-10-CM

## 2021-11-01 DIAGNOSIS — M19072 Primary osteoarthritis, left ankle and foot: Secondary | ICD-10-CM

## 2021-11-01 DIAGNOSIS — Z85828 Personal history of other malignant neoplasm of skin: Secondary | ICD-10-CM

## 2021-11-01 DIAGNOSIS — R202 Paresthesia of skin: Secondary | ICD-10-CM

## 2021-11-01 DIAGNOSIS — Z7952 Long term (current) use of systemic steroids: Secondary | ICD-10-CM

## 2021-11-01 DIAGNOSIS — M722 Plantar fascial fibromatosis: Secondary | ICD-10-CM

## 2021-11-01 DIAGNOSIS — Z8639 Personal history of other endocrine, nutritional and metabolic disease: Secondary | ICD-10-CM

## 2021-11-01 DIAGNOSIS — M7731 Calcaneal spur, right foot: Secondary | ICD-10-CM

## 2021-11-01 DIAGNOSIS — Z79899 Other long term (current) drug therapy: Secondary | ICD-10-CM

## 2021-11-01 DIAGNOSIS — M7732 Calcaneal spur, left foot: Secondary | ICD-10-CM

## 2021-11-01 DIAGNOSIS — M7062 Trochanteric bursitis, left hip: Secondary | ICD-10-CM

## 2021-11-01 DIAGNOSIS — M5136 Other intervertebral disc degeneration, lumbar region: Secondary | ICD-10-CM

## 2021-11-01 DIAGNOSIS — Z8709 Personal history of other diseases of the respiratory system: Secondary | ICD-10-CM

## 2021-11-01 DIAGNOSIS — Z8679 Personal history of other diseases of the circulatory system: Secondary | ICD-10-CM

## 2021-11-01 DIAGNOSIS — M2241 Chondromalacia patellae, right knee: Secondary | ICD-10-CM

## 2021-11-01 DIAGNOSIS — Z1589 Genetic susceptibility to other disease: Secondary | ICD-10-CM

## 2021-11-01 DIAGNOSIS — M19042 Primary osteoarthritis, left hand: Secondary | ICD-10-CM

## 2021-11-01 DIAGNOSIS — R700 Elevated erythrocyte sedimentation rate: Secondary | ICD-10-CM

## 2021-11-01 NOTE — Patient Instructions (Addendum)
The mentation rate is normal you may decrease the dose of prednisone to 12.5 mg p.o. daily for 2 weeks and then 10 mg p.o. daily for 2 weeks.  After that he will taper by 1 mg every 2 weeks.  Standing Labs We placed an order today for your standing lab work.   Please have your standing labs drawn in August  If possible, please have your labs drawn 2 weeks prior to your appointment so that the provider can discuss your results at your appointment.  Please note that you may see your imaging and lab results in [place] before we have reviewed them. We may be awaiting multiple results to interpret others before contacting you. Please allow our office up to 72 hours to thoroughly review all of the results before contacting the office for clarification of your results.  We have open lab daily: Monday through Thursday from 1:30-4:30 PM and Friday from 1:30-4:00 PM at the office of Dr. Bo Merino, [facility].   Please be advised, all patients with office appointments requiring lab work will take precedent over walk-in lab work.  If possible, please come for your lab work on Monday and Friday afternoons, as you may experience shorter wait times. The office is located at [address] [place], [place], [place] [address] No appointment is necessary.   Labs are drawn by [facility]. Please bring your co-pay at the time of your lab draw.  You may receive a bill from [facility] for your lab work.  Please note if you are on Hydroxychloroquine and and an order has been placed for a Hydroxychloroquine level, you will need to have it drawn 4 hours or more after your last dose.  If you wish to have your labs drawn at another location, please call the office 24 hours in advance to send orders.  If you have any questions regarding directions or hours of operation,  please call [phone].   As a reminder, please drink plenty of water prior to coming for your lab work. Thanks!      Vaccines You are taking a medication(s) that can suppress your immune system.  The following immunizations are recommended: Flu annually Covid-19  Td/Tdap (tetanus, diphtheria, pertussis) every 10 years Pneumonia (Prevnar 15 then Pneumovax 23 at least 1 year apart.  Alternatively, can take Prevnar 20 without needing additional dose) Shingrix: 2 doses from 4 weeks to 6 months apart  Please check with your PCP to make sure you are up to date.   If you have signs or symptoms of an infection or start antibiotics: First, call your PCP for workup of your infection. Hold your medication through the infection, until you complete your antibiotics, and until symptoms resolve if you take the following: Injectable medication (Actemra, Benlysta, Cimzia, Cosentyx, Enbrel, Humira, Kevzara, Orencia, Remicade, Simponi, Stelara, Taltz, Tremfya) Methotrexate Leflunomide (Arava) Mycophenolate (Cellcept) Morrie Sheldon, Olumiant, or Rinvoq

## 2021-11-02 LAB — COMPLETE METABOLIC PANEL WITH GFR
AG Ratio: 2.1 (calc) (ref 1.0–2.5)
ALT: 39 U/L (ref 9–46)
AST: 22 U/L (ref 10–35)
Albumin: 4.5 g/dL (ref 3.6–5.1)
Alkaline phosphatase (APISO): 22 U/L — ABNORMAL LOW (ref 35–144)
BUN: 16 mg/dL (ref 7–25)
CO2: 27 mmol/L (ref 20–32)
Calcium: 9.5 mg/dL (ref 8.6–10.3)
Chloride: 102 mmol/L (ref 98–110)
Creat: 1.04 mg/dL (ref 0.70–1.30)
Globulin: 2.1 g/dL (calc) (ref 1.9–3.7)
Glucose, Bld: 87 mg/dL (ref 65–99)
Potassium: 4.2 mmol/L (ref 3.5–5.3)
Sodium: 140 mmol/L (ref 135–146)
Total Bilirubin: 1.1 mg/dL (ref 0.2–1.2)
Total Protein: 6.6 g/dL (ref 6.1–8.1)
eGFR: 83 mL/min/{1.73_m2} (ref >=60)

## 2021-11-02 LAB — CBC WITH DIFFERENTIAL/PLATELET
Absolute Monocytes: 605 cells/uL (ref 200–950)
Basophils Absolute: 48 cells/uL (ref 0–200)
Basophils Relative: 0.7 %
Eosinophils Absolute: 136 cells/uL (ref 15–500)
Eosinophils Relative: 2 %
HCT: 41.1 % (ref 38.5–50.0)
Hemoglobin: 13.9 g/dL (ref 13.2–17.1)
Lymphs Abs: 2387 cells/uL (ref 850–3900)
MCH: 32.9 pg (ref 27.0–33.0)
MCHC: 33.8 g/dL (ref 32.0–36.0)
MCV: 97.4 fL (ref 80.0–100.0)
MPV: 10.9 fL (ref 7.5–12.5)
Monocytes Relative: 8.9 %
Neutro Abs: 3624 cells/uL (ref 1500–7800)
Neutrophils Relative %: 53.3 %
Platelets: 200 10*3/uL (ref 140–400)
RBC: 4.22 10*6/uL (ref 4.20–5.80)
RDW: 12.7 % (ref 11.0–15.0)
Total Lymphocyte: 35.1 %
WBC: 6.8 10*3/uL (ref 3.8–10.8)

## 2021-11-02 LAB — SEDIMENTATION RATE: Sed Rate: 9 mm/h (ref 0–20)

## 2021-11-03 NOTE — Progress Notes (Signed)
CBC, CMP and sed rate are within normal limits.

## 2021-11-14 ENCOUNTER — Encounter: Payer: Self-pay | Admitting: Rheumatology

## 2021-11-26 ENCOUNTER — Other Ambulatory Visit: Payer: Self-pay | Admitting: Rheumatology

## 2021-11-26 MED ORDER — PREDNISONE 1 MG PO TABS: ORAL_TABLET | ORAL | 0 refills | Status: DC

## 2021-11-26 MED ORDER — PREDNISONE 5 MG PO TABS: 5.0000 mg | ORAL_TABLET | Freq: Every day | ORAL | 0 refills | Status: DC

## 2021-11-26 NOTE — Addendum Note (Signed)
Addended by: Carole Binning on: 11/26/2021 02:43 PM   Modules accepted: Orders

## 2021-11-26 NOTE — Telephone Encounter (Signed)
Attempted to contact the patient and left message to advise patient to call the office. Advised patient to let us know if he needs 1 mg and 5 mg of Prednisone or just the 1 mg.

## 2021-11-26 NOTE — Telephone Encounter (Signed)
Patient called the office stating she was supposed to be dropping down to '9mg'$  Prednisone. Patient states he was told to call when he was ready to drop to '9mg'$  so the '1mg'$  Prednisone could be sent in. Patient states he wont be doing that until Saturday 6/17. Patient would like that to be sent to [facility].

## 2021-11-26 NOTE — Telephone Encounter (Signed)
Patient returned call to the office and left message stating he will need both 1 mg and 5 mg of Prednisone as he will be tapering every 2 weeks.   Next Visit: 02/06/2022  Last Visit: 11/01/2021  Last Fill: 10/04/2021  Dx: Temporal arteritis   Current Dose per office note on 11/01/2021: advised to taper prednisone by 2.5 mg daily until he reaches 10 mg p.o. daily.  Then we will try to taper 1 mg every 2 weeks.  Okay to refill Prednisone?

## 2021-11-27 ENCOUNTER — Other Ambulatory Visit: Payer: Self-pay | Admitting: Adult Health

## 2021-11-27 DIAGNOSIS — E291 Testicular hypofunction: Secondary | ICD-10-CM

## 2021-11-28 NOTE — Progress Notes (Signed)
6 MONTH FOLLOW UP  Assessment and Plan:   Essential hypertension - continue medication - DASH diet, exercise and monitor at home. Call if greater than 130/80.  - CBC with Differential/Platelet - CMP/GFR - magnesium  Hyperlipidemia -continue medications, check lipids, decrease fatty foods, increase activity.  - Lipid panel - TSH  Hypogonadism male - continue replacement therapy, currently on natresto  - consider HA sx, try holding natresto for 1-2 weeks, restart if not changes in sx - Testosterone - defer, has been stable  Vitamin D deficiency - Continue supplement  MDD (HCC)/ Anxiety Typically well controlled, rare flares (xanax) Continue follow up with therapist Well managed by current regimen; continue medications Stress management techniques discussed, increase water, good sleep hygiene discussed, increase exercise, and increase veggies.   Temporal Arteritis (HCC) Actemra, slow steroid taper monitored by Dr. [name] Patient requests we recheck sed rate today  - ESR   Paresthesia of both hands Has been referred to PT, pending, encouraged to schedule Neg phalen's If symptoms do not improve with PT possible cervical MRI vs referral for neuro   Orders Placed This Encounter  Procedures   CBC with Differential/Platelet   COMPLETE METABOLIC PANEL WITH GFR   Magnesium   Lipid panel   TSH   Sedimentation rate   VITAMIN D 25 Hydroxy (Vit-D Deficiency, Fractures)    Discussed med's effects and SE's. Screening labs and tests as requested with regular follow-up as recommended.  Future Appointments  Date Time Provider [place]  02/06/2022  2:00 PM Bo Merino, MD [facility] None  05/27/2022  9:00 AM Darrol Jump, NP [facility] None    HPI 61 y.o. male presents for 6 month follow up on htn, hyperlipidemia, depression/anxiety, GERD, testicular hypofunction, vitamin D def.   He was dx with temporal arteritis in Jan 2023, following with Dr. [name]  with prednisone taper, down to 10 mg this week and also started actemra 162 mg q7d 08/15/2021, doing well thus far.   Hx of depression in remission off of meds, continues with counseling with Jackelyn Hoehn and finds this beneficial. Uses xanax rarely as needed.   He has allergies, followed with allergist for allergy shots (off since on steroids and doing well), he is taking claritin, using neti pot and nasal sprays.   Hx of GERD, known hiatal hernia per patient,  on omeprazole daily PM and famotidine PRN, was advised to reduce alcohol intake with improved sx previously.   Bil hand tingling, historically intermittent, was referred to PT for possible thoracic outlet vs cervical related to his job, but admits never started, L > R, persistent. Means to schedule, has just been overwhelmed with everything going on.   BMI is Body mass index is 26.13 kg/m., he has been working on diet and exercise.  Wt Readings from Last 3 Encounters:  11/29/21 190 lb (86.2 kg)  11/01/21 187 lb (84.8 kg)  09/19/21 189 lb (85.7 kg)   He had MRA as part of workup on 08/16/2021 that showed Concentric hypertrophy of the left ventricular myocardium suggestive of systemic arterial hypertension, also mildly tortuous bilateral vertebral and internal carotid arteries. We requested follow up for tighter BP control goal, he admitted to labile and has been monitoring more closely several times a day. Olmesartan was increased from 20 mg to 40 mg.    His blood pressure has been controlled at home, today their BP is BP: 120/80.  He does workout. He denies chest pain, shortness of breath, dizziness. He had negative at  home screening sleep study with dentist.  He is on cholesterol medication, atorvastatin 10 mg daily and denies myalgias. His cholesterol is at goal. The cholesterol last visit was:   Lab Results  Component Value Date   CHOL 162 05/24/2021   HDL 52 05/24/2021   LDLCALC 82 05/24/2021   TRIG 179 (H) 05/24/2021   CHOLHDL  3.1 05/24/2021    Last A1C in the office was:  Lab Results  Component Value Date   HGBA1C 5.6 09/12/2021   He has a history of testosterone deficiency and is on testosterone replacement (natresto). He states that the testosterone helps with his energy, libido, muscle mass. He has ED, taking levitra 20 mg which works well for him.  Lab Results  Component Value Date   TESTOSTERONE 499 05/24/2021    Patient is recommended Vitamin D supplement but admits not recently taking, does want recheck -   Lab Results  Component Value Date   VD25OH 36 05/24/2021   He has not been taking a b12 supplement, has B complex -  Lab Results  Component Value Date   VITAMINB12 311 05/24/2021      Current Medications:  Current Outpatient Medications on File Prior to Visit  Medication Sig Dispense Refill   ACTEMRA ACTPEN 162 MG/0.9ML SOAJ INJECT 162MG SUBCUTANEOUSLY  EVERY WEEK 3.6 mL 2   albuterol (VENTOLIN HFA) 108 (90 BASE) MCG/ACT inhaler Inhale 2 puffs into the lungs every 4 (four) hours as needed for wheezing or shortness of breath. 1 Inhaler 3   atorvastatin (LIPITOR) 10 MG tablet TAKE 1 TABLET ONCE DAILY FOR CHOLESTEROL. 90 tablet 3   azelastine (ASTELIN) 0.1 % nasal spray Place 1 spray into both nostrils 2 (two) times daily.     famotidine (PEPCID) 40 MG tablet TAKE 1 TABLET AT BEDTIME FOR ACID REFLUX AND INDIGESTION. 90 tablet 3   levocetirizine (XYZAL) 5 MG tablet Take 5 mg by mouth daily.     MAGNESIUM PO Take by mouth daily.     NATESTO 5.5 MG/ACT GEL use 1 pump in each nostril three times daily *wash hands after use 21.96 g 5   olmesartan (BENICAR) 40 MG tablet Take 1 tablet (40 mg total) by mouth daily. 30 tablet 11   omeprazole (PRILOSEC) 40 MG capsule Take  1 capsule  Daily  to Prevent Heartburn & Indigestion 90 capsule 3   Polyethylene Glycol 3350 (MIRALAX PO) Take by mouth.     predniSONE (DELTASONE) 1 MG tablet Take 4 tabs po daily along with 5 mg tablet for 2 weeks. Then take 3 tabs  po daily along with 5 mg tablet for 2 weeks. 98 tablet 0   predniSONE (DELTASONE) 5 MG tablet Take 3.5 tabs po daily x 2 weeks, then 3 tabs po daily x 2 weeks, then 2.5 tabs po daily x 2 weeks, then 2 tabs po daily. 154 tablet 0   predniSONE (DELTASONE) 5 MG tablet Take 1 tablet (5 mg total) by mouth daily with breakfast. 30 tablet 0   triamcinolone cream (KENALOG) 0.1 % Apply 1 application topically 2 (two) times daily. (Patient taking differently: Apply 1 application  topically as needed.) 45 g 1   vardenafil (LEVITRA) 20 MG tablet Take 1 tablet Daily if Needed for XXXX                                            /  TAKE ONE TABLET BY MOUTH 30 tablet 1   diclofenac sodium (VOLTAREN) 1 % GEL 3 g to 3 large joints three times daily (Patient not taking: Reported on 11/01/2021) 3 Tube 3   No current facility-administered medications on file prior to visit.    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 FAMILY HISTORY His family history includes Ankylosing spondylitis in his brother; Aortic  stenosis in his father; Asthma in his father; Cancer in his paternal grandmother; Heart attack in his paternal grandfather; Heart disease in his brother; Kidney disease in his father; Rheum arthritis in his father; Uterine cancer in his mother.  Review of Systems  Constitutional:  Negative for malaise/fatigue and weight loss.  HENT:  Negative for congestion, hearing loss, sore throat and tinnitus.        Chronic throat clearing, worse in AM  Eyes:  Negative for blurred vision and double vision.  Respiratory:  Negative for cough, shortness of breath and wheezing.   Cardiovascular:  Negative for chest pain, palpitations, orthopnea, claudication and leg swelling.  Gastrointestinal:  Negative for abdominal pain, blood in stool, constipation, diarrhea, heartburn, melena, nausea and vomiting.  Genitourinary: Negative.   Musculoskeletal:  Negative for joint pain and myalgias.  Skin:  Negative for rash.  Neurological:  Positive for tingling (bil hands, L> R, pending PT). Negative for dizziness, sensory change, weakness and headaches.  Endo/Heme/Allergies:  Negative for polydipsia.  Psychiatric/Behavioral:  Negative for depression and substance abuse. The patient is nervous/anxious (rare use of xanax, controlled). The patient does not have insomnia.   All other systems reviewed and are negative.   Physical Exam: Estimated body mass index is 26.13 kg/m as calculated from the following:   Height as of 11/01/21: 5' 11.5" (1.816 m).   Weight as of this encounter: 190 lb (86.2 kg). BP 120/80   Pulse 76   Temp 97.9 F (36.6 C)   Wt 190 lb (86.2 kg)   SpO2 99%   BMI 26.13 kg/m  General Appearance: Well nourished, in no apparent distress. Eyes: PERRLA, EOMs, conjunctiva no swelling or erythema. Mild left lid ptosis.  Sinuses: No Frontal/maxillary tenderness ENT/Mouth: Ext aud canals clear, normal light reflex with TMs without erythema, bulging. Good dentition. No erythema, swelling, or exudate on  post pharynx. Tonsils not swollen or erythematous. Hearing normal.  Neck: Supple, thyroid normal.  Respiratory: Respiratory effort normal, BS equal bilaterally without rales, rhonchi, wheezing or stridor. Cardio: RRR without murmurs, rubs or gallops. Brisk peripheral pulses without edema.  Abdomen: Soft, +BS. Non tender, no guarding, rebound, hernias, masses, or organomegaly.  Lymphatics:  Non tender without lymphadenopathy.  Musculoskeletal: Full ROM all peripheral extremities,5/5 strength, and normal gait. Skin: Warm, dry without rashes, lesions, ecchymosis.  Neuro: Cranial nerves intact, reflexes equal bilaterally. Normal muscle tone Psych: Awake and oriented X 3, normal affect, Insight and Judgment appropriate.   Gorden Harms Keslie Gritz 10:09 AM

## 2021-11-29 ENCOUNTER — Encounter: Payer: Self-pay | Admitting: Adult Health

## 2021-11-29 ENCOUNTER — Ambulatory Visit (INDEPENDENT_AMBULATORY_CARE_PROVIDER_SITE_OTHER): Payer: No Typology Code available for payment source | Admitting: Adult Health

## 2021-11-29 VITALS — BP 120/80 | HR 76 | Temp 97.9°F | Wt 190.0 lb

## 2021-11-29 DIAGNOSIS — E785 Hyperlipidemia, unspecified: Secondary | ICD-10-CM | POA: Diagnosis not present

## 2021-11-29 DIAGNOSIS — M316 Other giant cell arteritis: Secondary | ICD-10-CM

## 2021-11-29 DIAGNOSIS — Z79899 Other long term (current) drug therapy: Secondary | ICD-10-CM

## 2021-11-29 DIAGNOSIS — I10 Essential (primary) hypertension: Secondary | ICD-10-CM | POA: Diagnosis not present

## 2021-11-29 DIAGNOSIS — E559 Vitamin D deficiency, unspecified: Secondary | ICD-10-CM | POA: Diagnosis not present

## 2021-11-29 DIAGNOSIS — F3342 Major depressive disorder, recurrent, in full remission: Secondary | ICD-10-CM

## 2021-11-29 DIAGNOSIS — E291 Testicular hypofunction: Secondary | ICD-10-CM | POA: Diagnosis not present

## 2021-11-30 LAB — COMPLETE METABOLIC PANEL WITH GFR
AG Ratio: 2 (calc) (ref 1.0–2.5)
ALT: 44 U/L (ref 9–46)
AST: 28 U/L (ref 10–35)
Albumin: 4.4 g/dL (ref 3.6–5.1)
Alkaline phosphatase (APISO): 21 U/L — ABNORMAL LOW (ref 35–144)
BUN: 16 mg/dL (ref 7–25)
CO2: 26 mmol/L (ref 20–32)
Calcium: 9.2 mg/dL (ref 8.6–10.3)
Chloride: 105 mmol/L (ref 98–110)
Creat: 0.97 mg/dL (ref 0.70–1.30)
Globulin: 2.2 g/dL (calc) (ref 1.9–3.7)
Glucose, Bld: 91 mg/dL (ref 65–99)
Potassium: 4 mmol/L (ref 3.5–5.3)
Sodium: 141 mmol/L (ref 135–146)
Total Bilirubin: 1.1 mg/dL (ref 0.2–1.2)
Total Protein: 6.6 g/dL (ref 6.1–8.1)
eGFR: 90 mL/min/{1.73_m2} (ref >=60)

## 2021-11-30 LAB — TSH: TSH: 0.93 mIU/L (ref 0.40–4.50)

## 2021-11-30 LAB — LIPID PANEL
Cholesterol: 176 mg/dL (ref <200)
HDL: 78 mg/dL (ref >=40)
LDL Cholesterol (Calc): 83 mg/dL (calc)
Non-HDL Cholesterol (Calc): 98 mg/dL (calc) (ref <130)
Total CHOL/HDL Ratio: 2.3 (calc) (ref <5.0)
Triglycerides: 71 mg/dL (ref <150)

## 2021-11-30 LAB — CBC WITH DIFFERENTIAL/PLATELET
Absolute Monocytes: 489 cells/uL (ref 200–950)
Basophils Absolute: 28 cells/uL (ref 0–200)
Basophils Relative: 0.6 %
Eosinophils Absolute: 108 cells/uL (ref 15–500)
Eosinophils Relative: 2.3 %
HCT: 38.7 % (ref 38.5–50.0)
Hemoglobin: 13.4 g/dL (ref 13.2–17.1)
Lymphs Abs: 1551 cells/uL (ref 850–3900)
MCH: 33.8 pg — ABNORMAL HIGH (ref 27.0–33.0)
MCHC: 34.6 g/dL (ref 32.0–36.0)
MCV: 97.7 fL (ref 80.0–100.0)
MPV: 11.3 fL (ref 7.5–12.5)
Monocytes Relative: 10.4 %
Neutro Abs: 2524 cells/uL (ref 1500–7800)
Neutrophils Relative %: 53.7 %
Platelets: 180 10*3/uL (ref 140–400)
RBC: 3.96 10*6/uL — ABNORMAL LOW (ref 4.20–5.80)
RDW: 12.4 % (ref 11.0–15.0)
Total Lymphocyte: 33 %
WBC: 4.7 10*3/uL (ref 3.8–10.8)

## 2021-11-30 LAB — MAGNESIUM: Magnesium: 1.8 mg/dL (ref 1.5–2.5)

## 2021-11-30 LAB — SEDIMENTATION RATE: Sed Rate: 2 mm/h (ref 0–20)

## 2021-11-30 LAB — VITAMIN D 25 HYDROXY (VIT D DEFICIENCY, FRACTURES): Vit D, 25-Hydroxy: 29 ng/mL — ABNORMAL LOW (ref 30–100)

## 2021-12-24 ENCOUNTER — Other Ambulatory Visit: Payer: Self-pay | Admitting: Rheumatology

## 2021-12-24 MED ORDER — PREDNISONE 1 MG PO TABS: ORAL_TABLET | ORAL | 0 refills | Status: DC

## 2021-12-24 NOTE — Telephone Encounter (Signed)
Last Visit: 11/01/2021   Last Fill: 11/26/2021   Dx: Temporal arteritis    Current Dose per office note on 11/01/2021: advised to taper prednisone by 2.5 mg daily until he reaches 10 mg p.o. daily.  Then we will try to taper 1 mg every 2 weeks.  Per patient: currently at '8mg'$  and will reduce to '7mg'$  this week while continuing to reduce but '1mg'$  every two weeks.     Okay to refill Prednisone?

## 2022-01-06 ENCOUNTER — Other Ambulatory Visit: Payer: Self-pay | Admitting: Rheumatology

## 2022-01-06 MED ORDER — PREDNISONE 5 MG PO TABS: 5.0000 mg | ORAL_TABLET | Freq: Every day | ORAL | 0 refills | Status: DC

## 2022-01-06 NOTE — Telephone Encounter (Signed)
Next Visit: 02/06/2022  Last Visit: 11/01/2021   Last Fill: 11/26/2021   Dx: Temporal arteritis    Current Dose per office note on 11/01/2021: advised to taper prednisone by 2.5 mg daily until he reaches 10 mg p.o. daily.  Then we will try to taper 1 mg every 2 weeks.   Per patient on 12/24/2021:  currently at '8mg'$  and will reduce to '7mg'$  this week while continuing to reduce but '1mg'$  every two weeks.     Okay to refill Prednisone?

## 2022-01-19 ENCOUNTER — Other Ambulatory Visit: Payer: Self-pay | Admitting: Rheumatology

## 2022-01-19 DIAGNOSIS — Z79899 Other long term (current) drug therapy: Secondary | ICD-10-CM

## 2022-01-19 DIAGNOSIS — M316 Other giant cell arteritis: Secondary | ICD-10-CM

## 2022-01-20 NOTE — Telephone Encounter (Signed)
Next Visit: 02/06/2022   Last Visit: 11/01/2021   Last Fill: 10/28/2021  Dx: Temporal arteritis    Current Dose per office note on 11/01/2021:  Labs: 11/29/2021 Alk. Phos 21, RBC 3.96, MCH 33.8   TB Gold: 08/09/2021 Neg   Okay to refill Actemra?

## 2022-01-24 NOTE — Progress Notes (Signed)
Office Visit Note  Patient: Stephen Campbell             Date of Birth: [birth year]           MRN: [id number]             PCP: Unk Pinto, MD Referring: Unk Pinto, MD Visit Date: 02/06/2022 Occupation: @GUAROCC @  Subjective:  Follow-up (Recently sick, wondering about Actemra and Prednisone. )   History of Present Illness: Yardley Lekas is a 61 y.o. male with history of temporal arteritis and osteoarthritis.  He states he had recent upper respiratory tract infection and is skipped a dose of Actemra.  He is gradually getting better.  He continues to have some joint stiffness due to underlying osteoarthritis.  He is also concerned about possible exposure to Lyme disease since his wife was recently exposed to Lyme and was diagnosed with acute Lyme disease.  He does not remember having tick bites or rash.  He continues to have some paresthesias in his hands.  Activities of Daily Living:  Patient reports morning stiffness for 15 minutes.   Patient Reports nocturnal pain.  Difficulty dressing/grooming: Denies Difficulty climbing stairs: Denies Difficulty getting out of chair: Denies Difficulty using hands for taps, buttons, cutlery, and/or writing: Denies  Review of Systems  Constitutional:  Negative for fatigue.  HENT:  Negative for mouth sores and mouth dryness.   Eyes:  Negative for dryness.  Respiratory:  Negative for shortness of breath.   Cardiovascular:  Positive for palpitations. Negative for chest pain.  Gastrointestinal:  Negative for blood in stool, constipation and diarrhea.  Endocrine: Negative for increased urination.  Genitourinary:  Negative for involuntary urination.  Musculoskeletal:  Positive for joint pain, joint pain and morning stiffness. Negative for gait problem, joint swelling, myalgias, muscle weakness, muscle tenderness and myalgias.  Skin:  Positive for rash. Negative for color change, hair loss and sensitivity to sunlight.  Allergic/Immunologic:  Positive for susceptible to infections.  Neurological:  Positive for headaches. Negative for dizziness.  Hematological:  Negative for swollen glands.  Psychiatric/Behavioral:  Positive for sleep disturbance. Negative for depressed mood. The patient is not nervous/anxious.     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   Hypertension    Hyperlipidemia    Allergy    Asthma    Depression    GERD (gastroesophageal reflux disease)    ED (erectile dysfunction)    Plantar fasciitis 01/06/2012    Past Medical History:  Diagnosis Date   Allergy    Anxiety    Asthma    Basal cell carcinoma    Depression    ED (erectile dysfunction)    GERD (gastroesophageal reflux disease)    Hyperlipidemia    Hypertension    Hypogonadism in male    Osteoarthritis    Temporal arteritis ([place])    Vitamin D deficiency     Family History  Problem Relation Age of Onset   Uterine cancer Mother    Asthma Father    Kidney disease Father    Rheum arthritis Father  Aortic stenosis Father        valve replacement   Ankylosing spondylitis Brother    Heart disease Brother    Cancer Paternal Grandmother    Heart attack Paternal Grandfather    Colon cancer Neg Hx    Colon polyps Neg Hx    Gallbladder disease Neg Hx    Esophageal cancer Neg Hx    Diabetes Neg Hx    Past Surgical History:  Procedure Laterality Date   HERNIA REPAIR Bilateral 1970   61 yrs old, inguinal    LASIK     ORTHOPEDIC SURGERY Left    left shoulder, bone spur, Dr. Samul Dada    PTOSIS REPAIR Bilateral 11/2020   Dr. Toni Arthurs - [facility]   scalp biopsy     basal cell    VASECTOMY  2013   Social History   Social History Narrative   Not on file   Immunization History  Administered Date(s) Administered   Influenza Inj Mdck Quad Pf 04/24/2017   Influenza Inj Mdck Quad With Preservative 02/26/2018, 05/04/2020   Influenza Split 03/28/2013, 03/30/2014   Influenza, Seasonal, Injecte, Preservative Fre 05/04/2015   PFIZER Comirnaty(Gray Top)Covid-19 Tri-Sucrose Vaccine 03/21/2021   PFIZER(Purple Top)SARS-COV-2 Vaccination 06/24/2019, 07/15/2019, 06/10/2020   Tdap 02/20/2017     Objective: Vital Signs: BP 118/81 (BP Location: Left Arm, Patient Position: Sitting, Cuff Size: Normal)   Pulse 94   Resp 14   Ht 6' (1.829 m)   Wt 187 lb 3.2 oz (84.9 kg)   BMI 25.39 kg/m    Physical Exam Vitals and nursing note reviewed.  Constitutional:      Appearance: He is well-developed.  HENT:     Head: Normocephalic and atraumatic.  Eyes:     Conjunctiva/sclera: Conjunctivae normal.     Pupils: Pupils are equal, round, and reactive to light.  Cardiovascular:     Rate and Rhythm: Normal rate and regular rhythm.     Heart sounds: Normal heart sounds.  Pulmonary:     Effort: Pulmonary effort is normal.     Breath sounds: Normal breath sounds.  Abdominal:     General: Bowel sounds are normal.     Palpations: Abdomen is soft.  Musculoskeletal:     Cervical back: Normal range of motion and neck supple.  Skin:    General: Skin is warm and dry.     Capillary Refill: Capillary refill takes less than 2 seconds.  Neurological:     Mental Status: He is alert and oriented to person, place, and time.  Psychiatric:        Behavior: Behavior normal.      Musculoskeletal Exam: C-spine was in good range of motion.  Shoulder joints, elbow joints, wrist joints, MCPs PIPs and DIPs with good range of motion.  He had bilateral PIP and DIP thickening with no synovitis.  Hip  joints and knee joints with good range of motion.  There was no tenderness over ankles or MTPs.  CDAI Exam: CDAI Score: -- Patient Global: --; Provider Global: -- Swollen: 0 ; Tender: 0  Joint Exam 02/06/2022   No joint exam has been documented for this visit   There is currently no information documented on the homunculus. Go to the Rheumatology activity and complete the homunculus joint exam.  Investigation: No additional findings.  Imaging: No results found.  Recent Labs: Lab Results  Component Value Date   WBC 4.7 11/29/2021   HGB 13.4 11/29/2021   PLT 180 11/29/2021  NA 141 11/29/2021   K 4.0 11/29/2021   CL 105 11/29/2021   CO2 26 11/29/2021   GLUCOSE 91 11/29/2021   BUN 16 11/29/2021   CREATININE 0.97 11/29/2021   BILITOT 1.1 11/29/2021   ALKPHOS 39 (L) 08/15/2016   AST 28 11/29/2021   ALT 44 11/29/2021   PROT 6.6 11/29/2021   ALBUMIN 4.4 08/15/2016   CALCIUM 9.2 11/29/2021   GFRAA 101 11/16/2020   QFTBGOLDPLUS NEGATIVE 08/09/2021    Speciality Comments: Actemra started 08/15/21  Procedures:  No procedures performed Allergies: Enalapril   Assessment / Plan:     Visit Diagnoses: Temporal arteritis ([place]) - he presented with temporal headaches and January 2023.  He was found to have elevated sedimentation rate and CRP.  -He has been doing well on Actemra and prednisone combination.  He has been tapering prednisone gradually.  He has been on prednisone 5 mg p.o. daily.  He will be reducing it to 4 mg p.o. daily next week.  The plan is to taper prednisone by 1 mg every 2 weeks.  I will check sed rate with his next labs.  Plan: Sedimentation rate  High risk medication use - Actemra 162 mg into the skin every 7 (seven) days. prednisone 5 mg p.o. daily for the last 2 weeks he is tapering prednisone by 1 mg every 2 weeks.- Plan: CBC with Differential/Platelet, COMPLETE METABOLIC PANEL WITH GFR in August and every 3 months.  Labs obtained on November 29, 2021 were reviewed  which were within normal limits.  Sedimentation rate was 2.  TSH was normal.  TB gold was negative on August 09, 2021.  Lipid panel was obtained on November 29, 2021.  Long term (current) use of systemic steroids-long-term side effects of steroid use were discussed.  He is gradually tapering prednisone.  Dietary modifications and exercise was emphasized.  Psoriasis-he has intermittent mild rash of psoriasis.  HLA B27 positive  Primary osteoarthritis of both hands-DIP and PIP thickening with no synovitis was noted.  Paresthesia of both hands -he is concerned about possible exposure to Lyme disease.  Plan: B. burgdorfi antibodies with next labs.  Trochanteric bursitis of both hips-he is interim discomfort.  Stretching exercises were discussed.  Chondromalacia patellae, right knee-chronic pain.  Primary osteoarthritis of both feet-he denies any discomfort in his feet today.  Plantar fasciitis-he has not returned [place] fasciitis.  Bilateral calcaneal spurs  DDD (degenerative disc disease), lumbar-he continues to have some lower back pain.  History of basal cell carcinoma-followed by dermatologist.  History of hypercholesterolemia  History of anxiety  History of asthma  History of gastroesophageal reflux (GERD)  History of hypertension-blood pressure is normal today.  Orders: Orders Placed This Encounter  Procedures   CBC with Differential/Platelet   COMPLETE METABOLIC PANEL WITH GFR   Sedimentation rate   B. burgdorfi antibodies   No orders of the defined types were placed in this encounter.   Follow-Up Instructions: Return for Temporal arteritis.   Bo Merino, MD  Note - This record has been created using Editor, commissioning.  Chart creation errors have been sought, but may not always  have been located. Such creation errors do not reflect on  the standard of medical care.

## 2022-02-06 ENCOUNTER — Other Ambulatory Visit: Payer: Self-pay

## 2022-02-06 ENCOUNTER — Encounter: Payer: Self-pay | Admitting: Rheumatology

## 2022-02-06 ENCOUNTER — Ambulatory Visit: Payer: No Typology Code available for payment source | Attending: Rheumatology | Admitting: Rheumatology

## 2022-02-06 VITALS — BP 118/81 | HR 94 | Resp 14 | Ht 72.0 in | Wt 187.2 lb

## 2022-02-06 DIAGNOSIS — M19042 Primary osteoarthritis, left hand: Secondary | ICD-10-CM

## 2022-02-06 DIAGNOSIS — Z1589 Genetic susceptibility to other disease: Secondary | ICD-10-CM

## 2022-02-06 DIAGNOSIS — Z85828 Personal history of other malignant neoplasm of skin: Secondary | ICD-10-CM

## 2022-02-06 DIAGNOSIS — M7062 Trochanteric bursitis, left hip: Secondary | ICD-10-CM

## 2022-02-06 DIAGNOSIS — Z7952 Long term (current) use of systemic steroids: Secondary | ICD-10-CM | POA: Diagnosis not present

## 2022-02-06 DIAGNOSIS — M51369 Other intervertebral disc degeneration, lumbar region without mention of lumbar back pain or lower extremity pain: Secondary | ICD-10-CM

## 2022-02-06 DIAGNOSIS — M316 Other giant cell arteritis: Secondary | ICD-10-CM | POA: Diagnosis not present

## 2022-02-06 DIAGNOSIS — M2241 Chondromalacia patellae, right knee: Secondary | ICD-10-CM

## 2022-02-06 DIAGNOSIS — R202 Paresthesia of skin: Secondary | ICD-10-CM

## 2022-02-06 DIAGNOSIS — M19072 Primary osteoarthritis, left ankle and foot: Secondary | ICD-10-CM

## 2022-02-06 DIAGNOSIS — Z8639 Personal history of other endocrine, nutritional and metabolic disease: Secondary | ICD-10-CM

## 2022-02-06 DIAGNOSIS — Z8659 Personal history of other mental and behavioral disorders: Secondary | ICD-10-CM

## 2022-02-06 DIAGNOSIS — Z79899 Other long term (current) drug therapy: Secondary | ICD-10-CM

## 2022-02-06 DIAGNOSIS — Z8679 Personal history of other diseases of the circulatory system: Secondary | ICD-10-CM

## 2022-02-06 DIAGNOSIS — M5136 Other intervertebral disc degeneration, lumbar region: Secondary | ICD-10-CM

## 2022-02-06 DIAGNOSIS — L409 Psoriasis, unspecified: Secondary | ICD-10-CM

## 2022-02-06 DIAGNOSIS — M7061 Trochanteric bursitis, right hip: Secondary | ICD-10-CM

## 2022-02-06 DIAGNOSIS — Z8709 Personal history of other diseases of the respiratory system: Secondary | ICD-10-CM

## 2022-02-06 DIAGNOSIS — M722 Plantar fascial fibromatosis: Secondary | ICD-10-CM

## 2022-02-06 DIAGNOSIS — M19071 Primary osteoarthritis, right ankle and foot: Secondary | ICD-10-CM

## 2022-02-06 DIAGNOSIS — M7732 Calcaneal spur, left foot: Secondary | ICD-10-CM

## 2022-02-06 DIAGNOSIS — M7731 Calcaneal spur, right foot: Secondary | ICD-10-CM

## 2022-02-06 DIAGNOSIS — Z8719 Personal history of other diseases of the digestive system: Secondary | ICD-10-CM

## 2022-02-06 DIAGNOSIS — M19041 Primary osteoarthritis, right hand: Secondary | ICD-10-CM

## 2022-02-06 MED ORDER — PREDNISONE 1 MG PO TABS: 4.0000 mg | ORAL_TABLET | Freq: Every day | ORAL | 0 refills | Status: DC

## 2022-02-06 NOTE — Telephone Encounter (Signed)
Please review and sign pended prednisone refill (reflects dose change that you advised today at patient's appointment). Thanks!

## 2022-02-06 NOTE — Patient Instructions (Signed)
Standing Labs We placed an order today for your standing lab work.   Please have your standing labs drawn in August and every 3 months  If possible, please have your labs drawn 2 weeks prior to your appointment so that the provider can discuss your results at your appointment.  Please note that you may see your imaging and lab results in [place] before we have reviewed them. We may be awaiting multiple results to interpret others before contacting you. Please allow our office up to 72 hours to thoroughly review all of the results before contacting the office for clarification of your results.  We currently have open lab daily: Monday through Thursday from 1:30 PM-4:30 PM and Friday from 1:30 PM- 4:00 PM If possible, please come for your lab work on Monday, Thursday or Friday afternoons, as you may experience shorter wait times.   Effective April 16, 2022 the new lab hours will change to: Monday through Thursday from 1:30 PM-5:00 PM and Friday from 8:30 AM-12:00 PM If possible, please come for your lab work on Monday and Thursday afternoons, as you may experience shorter wait times.  Please be advised, all patients with office appointments requiring lab work will take precedent over walk-in lab work.    The office is located at [address], [place], [place], [place] [address] No appointment is necessary.   Labs are drawn by [facility]. Please bring your co-pay at the time of your lab draw.  You may receive a bill from [facility] for your lab work.  Please note if you are on Hydroxychloroquine and and an order has been placed for a Hydroxychloroquine level, you will need to have it drawn 4 hours or more after your last dose.  If you wish to have your labs drawn at another location, please call the office 24 hours in advance to send orders.  If you have any questions regarding directions or hours of operation,  please call [phone].   As a reminder, please drink plenty of water prior to  coming for your lab work. Thanks!   Vaccines You are taking a medication(s) that can suppress your immune system.  The following immunizations are recommended: Flu annually Covid-19  Td/Tdap (tetanus, diphtheria, pertussis) every 10 years Pneumonia (Prevnar 15 then Pneumovax 23 at least 1 year apart.  Alternatively, can take Prevnar 20 without needing additional dose) Shingrix: 2 doses from 4 weeks to 6 months apart  Please check with your PCP to make sure you are up to date.   COVID-19 vaccine recommendations:   COVID-19 vaccine is recommended for everyone (unless you are allergic to a vaccine component), even if you are on a medication that suppresses your immune system.   Do not take Tylenol or any anti-inflammatory medications (NSAIDs) 24 hours prior to the COVID-19 vaccination.   There is no direct evidence about the efficacy of the COVID-19 vaccine in individuals who are on medications that suppress the immune system.   Even if you are fully vaccinated, and you are on any medications that suppress your immune system, please continue to wear a mask, maintain at least six feet social distance and practice hand hygiene.    Please see the following web sites for updated information.   [email or web address]

## 2022-02-21 ENCOUNTER — Other Ambulatory Visit: Payer: Self-pay | Admitting: *Deleted

## 2022-02-21 DIAGNOSIS — Z79899 Other long term (current) drug therapy: Secondary | ICD-10-CM

## 2022-02-21 DIAGNOSIS — M316 Other giant cell arteritis: Secondary | ICD-10-CM

## 2022-02-21 DIAGNOSIS — R202 Paresthesia of skin: Secondary | ICD-10-CM

## 2022-02-22 LAB — COMPLETE METABOLIC PANEL WITH GFR
AG Ratio: 2.1 (calc) (ref 1.0–2.5)
ALT: 71 U/L — ABNORMAL HIGH (ref 9–46)
AST: 47 U/L — ABNORMAL HIGH (ref 10–35)
Albumin: 4.9 g/dL (ref 3.6–5.1)
Alkaline phosphatase (APISO): 28 U/L — ABNORMAL LOW (ref 35–144)
BUN: 17 mg/dL (ref 7–25)
CO2: 25 mmol/L (ref 20–32)
Calcium: 9.9 mg/dL (ref 8.6–10.3)
Chloride: 103 mmol/L (ref 98–110)
Creat: 1.12 mg/dL (ref 0.70–1.30)
Globulin: 2.3 g/dL (calc) (ref 1.9–3.7)
Glucose, Bld: 100 mg/dL — ABNORMAL HIGH (ref 65–99)
Potassium: 4.3 mmol/L (ref 3.5–5.3)
Sodium: 139 mmol/L (ref 135–146)
Total Bilirubin: 1.1 mg/dL (ref 0.2–1.2)
Total Protein: 7.2 g/dL (ref 6.1–8.1)
eGFR: 76 mL/min/{1.73_m2} (ref >=60)

## 2022-02-22 LAB — CBC WITH DIFFERENTIAL/PLATELET
Absolute Monocytes: 430 cells/uL (ref 200–950)
Basophils Absolute: 50 cells/uL (ref 0–200)
Basophils Relative: 1 %
Eosinophils Absolute: 80 cells/uL (ref 15–500)
Eosinophils Relative: 1.6 %
HCT: 42 % (ref 38.5–50.0)
Hemoglobin: 14.6 g/dL (ref 13.2–17.1)
Lymphs Abs: 1065 cells/uL (ref 850–3900)
MCH: 33.2 pg — ABNORMAL HIGH (ref 27.0–33.0)
MCHC: 34.8 g/dL (ref 32.0–36.0)
MCV: 95.5 fL (ref 80.0–100.0)
MPV: 11 fL (ref 7.5–12.5)
Monocytes Relative: 8.6 %
Neutro Abs: 3375 cells/uL (ref 1500–7800)
Neutrophils Relative %: 67.5 %
Platelets: 203 10*3/uL (ref 140–400)
RBC: 4.4 10*6/uL (ref 4.20–5.80)
RDW: 11.7 % (ref 11.0–15.0)
Total Lymphocyte: 21.3 %
WBC: 5 10*3/uL (ref 3.8–10.8)

## 2022-02-22 LAB — B. BURGDORFI ANTIBODIES: B burgdorferi Ab IgG+IgM: <0.9 index

## 2022-02-22 LAB — SEDIMENTATION RATE: Sed Rate: 2 mm/h (ref 0–20)

## 2022-02-24 NOTE — Progress Notes (Signed)
LFTs are elevated.  Most likely due to statin use.  Please advise patient to avoid all NSAIDs and alcohol intake.  Avoid fatty food.  CBC is normal.  Sed rate normal.  Lyme titer negative.  Please forward results to his PCP.

## 2022-02-28 ENCOUNTER — Telehealth: Payer: Self-pay | Admitting: Pharmacist

## 2022-02-28 NOTE — Telephone Encounter (Signed)
Received notification that Actemra prior authorization has expired. Submitted a Prior Authorization request to [facility] for ACTEMRA via CoverMyMeds. Will update once we receive a response.  Key: [id number]  Knox Saliva, PharmD, MPH, BCPS, CPP Clinical Pharmacist (Rheumatology and Pulmonology)

## 2022-03-03 ENCOUNTER — Other Ambulatory Visit: Payer: Self-pay | Admitting: Rheumatology

## 2022-03-03 DIAGNOSIS — Z79899 Other long term (current) drug therapy: Secondary | ICD-10-CM

## 2022-03-03 DIAGNOSIS — M316 Other giant cell arteritis: Secondary | ICD-10-CM

## 2022-03-03 MED ORDER — ACTEMRA ACTPEN 162 MG/0.9ML ~~LOC~~ SOAJ: SUBCUTANEOUS | 2 refills | Status: DC

## 2022-03-03 NOTE — Addendum Note (Signed)
Addended by: Bertram Savin on: 03/03/2022 04:49 PM   Modules accepted: Orders

## 2022-03-03 NOTE — Telephone Encounter (Signed)
Next Visit: 04/17/2022  Last Visit: 02/06/2022  Last Fill: 01/20/2022  DC:VUDTHYHO arteritis   Current Dose per office note 02/06/2022: Actemra 162 mg into the skin every 7 (seven) days  Labs: 02/24/2022 LFTs are elevated.  Most likely due to statin use.  Please advise patient to avoid all NSAIDs and alcohol intake.  Avoid fatty food.  CBC is normal.  Sed rate normal.  Lyme titer negative.    TB Gold: 08/09/2021 Negative   Okay to refill Actemra?

## 2022-03-03 NOTE — Telephone Encounter (Signed)
Received notification from [facility] regarding a prior authorization for [place]. Authorization has been APPROVED from 02/28/22 to 03/01/23.   Patient must continue to fill through [facility]: [phone]   Authorization # [id number]  Knox Saliva, PharmD, MPH, BCPS, CPP Clinical Pharmacist (Rheumatology and Pulmonology)

## 2022-03-03 NOTE — Telephone Encounter (Signed)
Patient called the office requesting a refill of Actemra '162mg'$ /0.57m be sent to [facility]

## 2022-03-04 MED ORDER — ACTEMRA ACTPEN 162 MG/0.9ML ~~LOC~~ SOAJ: SUBCUTANEOUS | 2 refills | Status: DC

## 2022-04-01 ENCOUNTER — Encounter: Payer: Self-pay | Admitting: Rheumatology

## 2022-04-03 ENCOUNTER — Telehealth: Payer: Self-pay | Admitting: Physical Medicine and Rehabilitation

## 2022-04-03 DIAGNOSIS — R202 Paresthesia of skin: Secondary | ICD-10-CM

## 2022-04-03 NOTE — Telephone Encounter (Signed)
Patient wants to sch an appointment with dr Ernestina Patches [facility] [phone]

## 2022-04-03 NOTE — Progress Notes (Signed)
Office Visit Note  Patient: Stephen Campbell             Date of Birth: 1961/05/11           MRN: [id number]             PCP: Unk Pinto, MD Referring: Unk Pinto, MD Visit Date: 04/17/2022 Occupation: @GUAROCC @  Subjective:  Medication management  History of Present Illness: Stephen Campbell is a 61 y.o. male history of temporal arteritis osteoarthritis and psoriasis.  He denies having any episodes of headaches since the last visit.  He denies any muscular weakness or tenderness.  He continues to have some stiffness in his joints due to underlying osteoarthritis.  He took his last prednisone dose on April 03, 2022.  He has been taking Actemra injections weekly without any interruption.  He continues to have some psoriasis patches on his lower extremities.  He continues to have paresthesias in his hands.  He has noticed decreased grip strength in his left hand.  He had nerve conduction velocities by Dr. Ernestina Patches which showed mild carpal tunnel syndrome.  He believes the symptoms are coming from the cervical spine.  He has stiffness in his cervical spine.  He has intermittent lower back pain.  He also has infrequent Planter fasciitis.  Activities of Daily Living:  Patient reports morning stiffness for 10 minutes.   Patient Reports nocturnal pain.  Difficulty dressing/grooming: Denies Difficulty climbing stairs: Denies Difficulty getting out of chair: Denies Difficulty using hands for taps, buttons, cutlery, and/or writing: Denies  Review of Systems  Constitutional:  Negative for fatigue.  HENT:  Positive for mouth sores. Negative for mouth dryness.   Eyes:  Negative for dryness.  Respiratory:  Negative for shortness of breath.   Cardiovascular:  Negative for chest pain and palpitations.  Gastrointestinal:  Negative for blood in stool, constipation and diarrhea.  Endocrine: Negative for increased urination.  Genitourinary:  Negative for involuntary urination.  Musculoskeletal:   Positive for joint pain, joint pain and morning stiffness. Negative for gait problem, joint swelling, myalgias, muscle weakness, muscle tenderness and myalgias.  Skin:  Positive for rash and hair loss. Negative for color change and sensitivity to sunlight.       Psoriasis  Allergic/Immunologic: Positive for susceptible to infections.  Neurological:  Positive for dizziness. Negative for headaches.  Hematological:  Positive for swollen glands.  Psychiatric/Behavioral:  Positive for sleep disturbance. Negative for depressed mood. The patient is not nervous/anxious.     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/2014   Hypertension    Hyperlipidemia    Allergy    Asthma    Depression    GERD (gastroesophageal reflux disease)    ED (erectile dysfunction)    Plantar fasciitis 01/06/2012    Past Medical History:  Diagnosis Date   Allergy    Anxiety    Asthma    Basal cell carcinoma    Depression    ED (erectile dysfunction)    GERD (gastroesophageal reflux disease)    Hyperlipidemia    Hypertension  Hypogonadism in male    Osteoarthritis    Temporal arteritis ([place])    Vitamin D deficiency     Family History  Problem Relation Age of Onset   Uterine cancer Mother    Asthma Father    Kidney disease Father    Rheum arthritis Father    Aortic stenosis Father        valve replacement   Ankylosing spondylitis Brother    Heart disease Brother    Cancer Paternal Grandmother    Heart attack Paternal Grandfather    Colon cancer Neg Hx     Colon polyps Neg Hx    Gallbladder disease Neg Hx    Esophageal cancer Neg Hx    Diabetes Neg Hx    Past Surgical History:  Procedure Laterality Date   HERNIA REPAIR Bilateral 1970   61 yrs old, inguinal    LASIK     ORTHOPEDIC SURGERY Left    left shoulder, bone spur, Dr. Denice Bors   PTOSIS REPAIR Bilateral 11/2020   Dr. Toy Cookey - [facility]   scalp biopsy     basal cell    VASECTOMY  2013   Social History   Social History Narrative   Not on file   Immunization History  Administered Date(s) Administered   Influenza Inj Mdck Quad Pf 04/24/2017   Influenza Inj Mdck Quad With Preservative 02/26/2018, 05/04/2020   Influenza Split 03/28/2013, 03/30/2014   Influenza, Seasonal, Injecte, Preservative Fre 05/04/2015   PFIZER Comirnaty(Gray Top)Covid-19 Tri-Sucrose Vaccine 03/21/2021   PFIZER(Purple Top)SARS-COV-2 Vaccination 06/24/2019, 07/15/2019, 06/10/2020   Tdap 02/20/2017     Objective: Vital Signs: BP 124/79 (BP Location: Left Arm, Patient Position: Sitting, Cuff Size: Normal)   Pulse 84   Resp 15   Ht 6' (1.829 m)   Wt 190 lb 6.4 oz (86.4 kg)   BMI 25.82 kg/m    Physical Exam Vitals and nursing note reviewed.  Constitutional:      Appearance: He is well-developed.  HENT:     Head: Normocephalic and atraumatic.  Eyes:     Conjunctiva/sclera: Conjunctivae normal.     Pupils: Pupils are equal, round, and reactive to light.  Cardiovascular:     Rate and Rhythm: Normal rate and regular rhythm.     Heart sounds: Normal heart sounds.  Pulmonary:     Effort: Pulmonary effort is normal.     Breath sounds: Normal breath sounds.  Abdominal:     General: Bowel sounds are normal.     Palpations: Abdomen is soft.  Musculoskeletal:     Cervical back: Normal range of motion and neck supple.  Skin:    General: Skin is warm and dry.     Capillary Refill: Capillary refill takes less than 2 seconds.  Neurological:     Mental Status: He is alert and oriented to person,  place, and time.     Comments: He had no temporal artery tenderness.  No muscular weakness or tenderness was noted.  Psychiatric:        Behavior: Behavior normal.      Musculoskeletal Exam: He had discomfort with range of motion of the cervical spine.  Shoulder joints, elbow joints, wrist joints, MCPs number and good range of motion with no synovitis.  He had bilateral PIP and DIP thickening.  Hip joints and knee joints in good range of motion.  No warmth swelling or effusion was noted.  There was no tenderness over ankles or MTPs.  CDAI Exam: CDAI Score: --  Patient Global: --; Provider Global: -- Swollen: --; Tender: -- Joint Exam 04/17/2022   No joint exam has been documented for this visit   There is currently no information documented on the homunculus. Go to the Rheumatology activity and complete the homunculus joint exam.  Investigation: No additional findings.  Imaging: XR Cervical Spine 2 or 3 views  Result Date: 04/17/2022 Multilevel spondylosis with anterior osteophytes was noted.  Most significant narrowing was noted between C6-C7.  Spondylolisthesis was noted between C6-C7.  Facet joint arthropathy was noted. Impression: Multilevel spondylosis with most significant narrowing between C6-C7 was noted.  NCV with EMG (electromyography)  Result Date: 04/11/2022 Magnus Sinning, MD     04/16/2022  6:48 AM EMG & NCV Findings: Evaluation of the left median motor nerve showed decreased conduction velocity (Elbow-Wrist, 49 m/s).  The left ulnar motor nerve showed decreased conduction velocity (B Elbow-Wrist, 50 m/s).  The left median (across palm) sensory and the right median (across palm) sensory nerves showed prolonged distal peak latency (Wrist, L4.3, R4.2 ms) and prolonged distal peak latency (Palm, L2.1, R2.1 ms).  The left ulnar sensory and the right ulnar sensory nerves showed prolonged distal peak latency (L3.9, R4.1 ms) and decreased conduction velocity (Wrist-5th Digit,  L36, R34 m/s).  All remaining nerves (as indicated in the following tables) were within normal limits.  All left vs. right side differences were within normal limits.  All examined muscles (as indicated in the following table) showed no evidence of electrical instability.  Impression: The above electrodiagnostic study is ABNORMAL and reveals evidence of a mild bilateral median nerve entrapment at the wrist (carpal tunnel syndrome) affecting sensory components.  There was also borderline slowing of the ulnar sensory nerves distally.  This is likely temperature artifact or technical artifact but could be an indication of a mild underlying sensory predominant polyneuropathy although this is very much less likely. There is no significant electrodiagnostic evidence of any other focal nerve entrapment, brachial plexopathy or cervical radiculopathy in either upper limb. Recommendations: 1.  Follow-up with referring physician. 2.  Continue current management of symptoms. 3.  Continue use of resting splint at night-time and as needed during the day. ___________________________ Wonda Olds Board Certified, American Board of Physical Medicine and Rehabilitation Nerve Conduction Studies Anti Sensory Summary Table  Stim Site NR Peak (ms) Norm Peak (ms) P-T Amp (V) Norm P-T Amp Site1 Site2 Delta-P (ms) Dist (cm) Vel (m/s) Norm Vel (m/s) Left Median Acr Palm Anti Sensory (2nd Digit)  29.8C Wrist    *4.3 <3.6 26.2 >10 Wrist Palm 2.2 0.0   Palm    *2.1 <2.0 22.6        Right Median Acr Palm Anti Sensory (2nd Digit)  29.1C Wrist    *4.2 <3.6 23.4 >10 Wrist Palm 2.1 0.0   Palm    *2.1 <2.0 30.8        Left Radial Anti Sensory (Base 1st Digit)  31.2C Wrist    2.2 <3.1 19.1  Wrist Base 1st Digit 2.2 0.0   Right Radial Anti Sensory (Base 1st Digit)  29C Wrist    2.5 <3.1 35.4  Wrist Base 1st Digit 2.5 0.0   Left Ulnar Anti Sensory (5th Digit)  31.8C Wrist    *3.9 <3.7 24.4 >15.0 Wrist 5th Digit 3.9 14.0 *36 >38 Right Ulnar  Anti Sensory (5th Digit)  29.2C Wrist    *4.1 <3.7 25.4 >15.0 Wrist 5th Digit 4.1 14.0 *34 >38 Motor Summary Table  Stim Site NR  Onset (ms) Norm Onset (ms) O-P Amp (mV) Norm O-P Amp Site1 Site2 Delta-0 (ms) Dist (cm) Vel (m/s) Norm Vel (m/s) Left Median Motor (Abd Poll Brev)  30.6C Wrist    4.0 <4.2 5.9 >5 Elbow Wrist 4.7 23.0 *49 >50 Elbow    8.7  5.5        Right Median Motor (Abd Poll Brev)  29C Wrist    4.0 <4.2 7.9 >5 Elbow Wrist 4.8 24.0 50 >50 Elbow    8.8  7.6        Left Ulnar Motor (Abd Dig Min)  30C Wrist    3.9 <4.2 8.7 >3 B Elbow Wrist 4.8 24.0 *50 >53 B Elbow    8.7  8.6  A Elbow B Elbow 1.9 10.0 53 >53 A Elbow    10.6  8.1        Right Ulnar Motor (Abd Dig Min)  28.9C Wrist    3.8 <4.2 8.4 >3 B Elbow Wrist 4.4 24.0 55 >53 B Elbow    8.2  7.7  A Elbow B Elbow 1.6 10.0 62 >53 A Elbow    9.8  7.3        EMG  Side Muscle Nerve Root Ins Act Fibs Psw Amp Dur Poly Recrt Int Fraser Din Comment Left Abd Poll Brev Median C8-T1 Nml Nml Nml Nml Nml 0 Nml Nml  Left 1stDorInt Ulnar C8-T1 Nml Nml Nml Nml Nml 0 Nml Nml  Left PronatorTeres Median C6-7 Nml Nml Nml Nml Nml 0 Nml Nml  Left Biceps Musculocut C5-6 Nml Nml Nml Nml Nml 0 Nml Nml  Left Deltoid Axillary C5-6 Nml Nml Nml Nml Nml 0 Nml Nml  Nerve Conduction Studies Anti Sensory Left/Right Comparison  Stim Site L Lat (ms) R Lat (ms) L-R Lat (ms) L Amp (V) R Amp (V) L-R Amp (%) Site1 Site2 L Vel (m/s) R Vel (m/s) L-R Vel (m/s) Median Acr Palm Anti Sensory (2nd Digit)  29.8C Wrist *4.3 *4.2 0.1 26.2 23.4 10.7 Wrist Palm    Palm *2.1 *2.1 0.0 22.6 30.8 26.6      Radial Anti Sensory (Base 1st Digit)  31.2C Wrist 2.2 2.5 0.3 19.1 35.4 46.0 Wrist Base 1st Digit    Ulnar Anti Sensory (5th Digit)  31.8C Wrist *3.9 *4.1 0.2 24.4 25.4 3.9 Wrist 5th Digit *36 *34 2 Motor Left/Right Comparison  Stim Site L Lat (ms) R Lat (ms) L-R Lat (ms) L Amp (mV) R Amp (mV) L-R Amp (%) Site1 Site2 L Vel (m/s) R Vel (m/s) L-R Vel (m/s) Median Motor (Abd Poll Brev)  30.6C Wrist 4.0 4.0  0.0 5.9 7.9 25.3 Elbow Wrist *49 50 1 Elbow 8.7 8.8 0.1 5.5 7.6 27.6      Ulnar Motor (Abd Dig Min)  30C Wrist 3.9 3.8 0.1 8.7 8.4 3.4 B Elbow Wrist *50 55 5 B Elbow 8.7 8.2 0.5 8.6 7.7 10.5 A Elbow B Elbow 53 62 9 A Elbow 10.6 9.8 0.8 8.1 7.3 9.9      Waveforms:              Recent Labs: Lab Results  Component Value Date   WBC 5.0 02/21/2022   HGB 14.6 02/21/2022   PLT 203 02/21/2022   NA 139 02/21/2022   K 4.3 02/21/2022   CL 103 02/21/2022   CO2 25 02/21/2022   GLUCOSE 100 (H) 02/21/2022   BUN 17 02/21/2022   CREATININE 1.12 02/21/2022   BILITOT 1.1 02/21/2022   ALKPHOS 39 (L)  08/15/2016   AST 47 (H) 02/21/2022   ALT 71 (H) 02/21/2022   PROT 7.2 02/21/2022   ALBUMIN 4.4 08/15/2016   CALCIUM 9.9 02/21/2022   GFRAA 101 11/16/2020   QFTBGOLDPLUS NEGATIVE 08/09/2021    Speciality Comments: Actemra started 08/15/21  Procedures:  No procedures performed Allergies: Enalapril   Assessment / Plan:     Visit Diagnoses: Temporal arteritis ([place]) - he presented with temporal headaches in January 2023.  He was found to have elevated sedimentation rate and CRP.  He has been on a prednisone taper which she finished a couple of weeks ago.  He has been on Actemra 162 mg subcu weekly since August 15, 2021 which she has been tolerating well.  He had no recurrence of headaches.  High risk medication use - Actemra 162 mg into the skin every 7 (seven) days.  He has been off prednisone since April 03, 2022.  Labs obtained on February 21, 2022 CBC was normal.  CMP showed elevated LFTs AST 47 and ALT 71.  Patient denies taking any NSAIDs.  Weight gain could be the reason for elevated LFTs.  He is also on statins.  Weight loss diet and exercise was emphasized.  We will recheck labs in December.  Information on immunization was placed in the AVS.  He was advised to hold Actemra if he develops an infection and resume after the infection resolves.  Long term (current) use of systemic steroids-he has been  off steroids now.  Elevated LFTs-we will continue to monitor LFTs.  Psoriasis-he has few psoriasis patches.  He will be using topical agents.  HLA B27 positive  Primary osteoarthritis of both hands-he had bilateral PIP and DIP thickening.  Joint protection muscle strengthening was discussed.  Paresthesia of both hands-nerve conduction velocities done by Dr. Ernestina Patches on April 11, 2022 showed bilateral mild median nerve entrapment.  Use of bilateral carpal tunnel braces was advised.  Patient believes his symptoms are coming from the cervical spine as he feels radiculopathy to the left side.  Trochanteric bursitis of both hips-he has intermittent symptoms.  IT band stretches were advised.  Chondromalacia patellae, right knee-chronic pain.  Primary osteoarthritis of both feet-proper fitting shoes were advised.  Plantar fasciitis-he has intermittent symptoms.  Bilateral calcaneal spurs  Neck pain -he continues to have neck pain and stiffness.  Plan: XR Cervical Spine 2 or 3 views.  X-rays showed multilevel spondylosis with C6-C7 narrowing and spondylolisthesis.  Physical therapy referral was placed .  Cervical radiculopathy-has been having left-sided radiculopathy with numbness in his left hand.  He states he has been dropping objects.  I will obtain MRI of the cervical spine to evaluate this further.  I also referred him to physical therapy.  DDD (degenerative disc disease), lumbar-he has intermittent lower back pain.  History of hypertension-blood pressure was normal today.  History of hypercholesterolemia  History of basal cell carcinoma - followed by dermatologist.  History of gastroesophageal reflux (GERD)  History of asthma  History of anxiety  Orders: Orders Placed This Encounter  Procedures   XR Cervical Spine 2 or 3 views   MR CERVICAL SPINE WO CONTRAST   Ambulatory referral to Physical Therapy   No orders of the defined types were placed in this  encounter.    Follow-Up Instructions: Return in about 3 months (around 07/18/2022) for Temporal arteritis.   Bo Merino, MD  Note - This record has been created using Editor, commissioning.  Chart creation errors have been sought, but may  not always  have been located. Such creation errors do not reflect on  the standard of medical care.

## 2022-04-04 NOTE — Telephone Encounter (Signed)
I left voicemail for patient requesting return call to let us know what he would like to see Dr. Ernestina Patches for--of note, patient did have referral in March for NCS/EMG but did not return call to schedule per chart. He sent message to Dr. [name] yesterday due to increasing numbness in his hands and wanting to know what facility he was being referred to. That referral was closed.   Does Dr. [name] need to enter new referral?

## 2022-04-07 ENCOUNTER — Telehealth: Payer: Self-pay | Admitting: Physical Medicine and Rehabilitation

## 2022-04-07 NOTE — Telephone Encounter (Signed)
Okay to place new referral for EMG/NCV?

## 2022-04-07 NOTE — Telephone Encounter (Signed)
New referral has been placed for Dr. Ernestina Patches. Thanks!

## 2022-04-07 NOTE — Telephone Encounter (Signed)
Ok to place new referral 

## 2022-04-07 NOTE — Telephone Encounter (Signed)
A new referral has been placed for NCS/EMG. Are you able to schedule for patient?

## 2022-04-07 NOTE — Telephone Encounter (Signed)
Patient has called back to see if we are able to get scheduled. Message sent to Isla Pence to see if she can schedule.

## 2022-04-07 NOTE — Telephone Encounter (Signed)
Pt returned call to [facility] to set an referral appt with Dr Ernestina Patches. Pt phone number is [phone].

## 2022-04-09 NOTE — Telephone Encounter (Signed)
Noted. Stephen Campbell has sent referral and has tried to reach patient to schedule.

## 2022-04-11 ENCOUNTER — Ambulatory Visit (INDEPENDENT_AMBULATORY_CARE_PROVIDER_SITE_OTHER): Payer: No Typology Code available for payment source | Admitting: Physical Medicine and Rehabilitation

## 2022-04-11 DIAGNOSIS — R202 Paresthesia of skin: Secondary | ICD-10-CM | POA: Diagnosis not present

## 2022-04-11 NOTE — Progress Notes (Unsigned)
Numeric Pain Rating Scale and Functional Assessment Average Pain 0   In the last MONTH (on 0-10 scale) has pain interfered with the following?  1. General activity like being  able to carry out your everyday physical activities such as walking, climbing stairs, carrying groceries, or moving a chair?  Rating(0)     Right handed. Numbness in both hands. Shooting pain at times from left bicep down to hand. Worse at night. Turning head a certain way can make numbness worse

## 2022-04-16 NOTE — Progress Notes (Signed)
Stephen Campbell - 61 y.o. male MRN [id number]  Date of birth: 10-08-1960  Office Visit Note: Visit Date: 04/11/2022 PCP: Unk Pinto, MD Referred by: Ofilia Neas, PA-C  Subjective: Chief Complaint  Patient presents with   Right Wrist - Numbness   Left Wrist - Numbness   HPI:  Stephen Campbell is a 61 y.o. male who comes in today at the request of Hazel Sams, PA-C for electrodiagnostic study of the Bilateral upper extremities.  Patient is Right hand dominant.  Chronic worsening severe numbness in both hands somewhat nondermatomal with some shooting pain from the left biceps down to the hand on the left.  He does get nocturnal complaints and worsening at night.  He does feel like if he turns his head different directions it seems to make symptoms in the arms worse.  But no frank radicular symptoms otherwise.  No prior electrodiagnostic studies.  He has a history of psoriasis as well as temporal arteritis.  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   ROS Otherwise per HPI.  Assessment & Plan: Visit Diagnoses:    ICD-10-CM   1. Paresthesia of skin  R20.2 NCV with EMG (electromyography)      Plan: Impression: The above electrodiagnostic study is ABNORMAL and reveals evidence of a mild bilateral median nerve entrapment at the wrist (carpal tunnel syndrome) affecting sensory components.  There was also borderline slowing of the ulnar sensory nerves distally.  This is likely temperature artifact or technical artifact but could be an indication of a mild underlying sensory predominant polyneuropathy although this is very much less likely.  There is no significant electrodiagnostic evidence of any other focal nerve entrapment, brachial plexopathy or cervical radiculopathy in either upper limb.   Recommendations: 1.  Follow-up with referring physician. 2.  Continue current management of symptoms. 3.  Continue use of resting splint at night-time and as needed during the day.  Meds &  Orders: No orders of the defined types were placed in this encounter.   Orders Placed This Encounter  Procedures   NCV with EMG (electromyography)    Follow-up: Return for Hazel Sams, PA-C.   Procedures: No procedures performed  EMG & NCV Findings: Evaluation of the left median motor nerve showed decreased conduction velocity (Elbow-Wrist, 49 m/s).  The left ulnar motor nerve showed decreased conduction velocity (B Elbow-Wrist, 50 m/s).  The left median (across palm) sensory and the right median (across palm) sensory nerves showed prolonged distal peak latency (Wrist, L4.3, R4.2 ms) and prolonged distal peak latency (Palm, L2.1, R2.1 ms).  The left ulnar sensory and the right ulnar sensory nerves showed prolonged distal peak latency (L3.9, R4.1 ms) and decreased conduction velocity (Wrist-5th Digit, L36, R34 m/s).  All remaining nerves (as indicated in the following tables) were within normal limits.  All left vs. right side differences were within normal limits.    All examined muscles (as indicated in the following table) showed no evidence of electrical instability.    Impression: The above electrodiagnostic study is ABNORMAL and reveals evidence of a mild bilateral median nerve entrapment at the wrist (carpal tunnel syndrome) affecting sensory components.  There was also borderline slowing of the ulnar sensory nerves distally.  This is likely temperature artifact or technical artifact but could be an indication of a mild underlying sensory predominant polyneuropathy although this is very much less likely.  There is no significant electrodiagnostic evidence of any other focal nerve entrapment, brachial plexopathy or cervical  radiculopathy in either upper limb.   Recommendations: 1.  Follow-up with referring physician. 2.  Continue current management of symptoms. 3.  Continue use of resting splint at night-time and as needed during the day.  ___________________________ Wonda Olds Board Certified, American Board of Physical Medicine and Rehabilitation    Nerve Conduction Studies Anti Sensory Summary Table   Stim Site NR Peak (ms) Norm Peak (ms) P-T Amp (V) Norm P-T Amp Site1 Site2 Delta-P (ms) Dist (cm) Vel (m/s) Norm Vel (m/s)  Left Median Acr Palm Anti Sensory (2nd Digit)  29.8C  Wrist    *4.3 <3.6 26.2 >10 Wrist Palm 2.2 0.0    Palm    *2.1 <2.0 22.6         Right Median Acr Palm Anti Sensory (2nd Digit)  29.1C  Wrist    *4.2 <3.6 23.4 >10 Wrist Palm 2.1 0.0    Palm    *2.1 <2.0 30.8         Left Radial Anti Sensory (Base 1st Digit)  31.2C  Wrist    2.2 <3.1 19.1  Wrist Base 1st Digit 2.2 0.0    Right Radial Anti Sensory (Base 1st Digit)  29C  Wrist    2.5 <3.1 35.4  Wrist Base 1st Digit 2.5 0.0    Left Ulnar Anti Sensory (5th Digit)  31.8C  Wrist    *3.9 <3.7 24.4 >15.0 Wrist 5th Digit 3.9 14.0 *36 >38  Right Ulnar Anti Sensory (5th Digit)  29.2C  Wrist    *4.1 <3.7 25.4 >15.0 Wrist 5th Digit 4.1 14.0 *34 >38   Motor Summary Table   Stim Site NR Onset (ms) Norm Onset (ms) O-P Amp (mV) Norm O-P Amp Site1 Site2 Delta-0 (ms) Dist (cm) Vel (m/s) Norm Vel (m/s)  Left Median Motor (Abd Poll Brev)  30.6C  Wrist    4.0 <4.2 5.9 >5 Elbow Wrist 4.7 23.0 *49 >50  Elbow    8.7  5.5         Right Median Motor (Abd Poll Brev)  29C  Wrist    4.0 <4.2 7.9 >5 Elbow Wrist 4.8 24.0 50 >50  Elbow    8.8  7.6         Left Ulnar Motor (Abd Dig Min)  30C  Wrist    3.9 <4.2 8.7 >3 B Elbow Wrist 4.8 24.0 *50 >53  B Elbow    8.7  8.6  A Elbow B Elbow 1.9 10.0 53 >53  A Elbow    10.6  8.1         Right Ulnar Motor (Abd Dig Min)  28.9C  Wrist    3.8 <4.2 8.4 >3 B Elbow Wrist 4.4 24.0 55 >53  B Elbow    8.2  7.7  A Elbow B Elbow 1.6 10.0 62 >53  A Elbow    9.8  7.3          EMG   Side Muscle Nerve Root Ins Act Fibs Psw Amp Dur Poly Recrt Int Fraser Din Comment  Left Abd Poll Brev Median C8-T1 Nml Nml Nml Nml Nml 0 Nml Nml   Left 1stDorInt Ulnar C8-T1 Nml Nml  Nml Nml Nml 0 Nml Nml   Left PronatorTeres Median C6-7 Nml Nml Nml Nml Nml 0 Nml Nml   Left Biceps Musculocut C5-6 Nml Nml Nml Nml Nml 0 Nml Nml   Left Deltoid Axillary C5-6 Nml Nml Nml Nml Nml 0 Nml Nml     Nerve Conduction  Studies Anti Sensory Left/Right Comparison   Stim Site L Lat (ms) R Lat (ms) L-R Lat (ms) L Amp (V) R Amp (V) L-R Amp (%) Site1 Site2 L Vel (m/s) R Vel (m/s) L-R Vel (m/s)  Median Acr Palm Anti Sensory (2nd Digit)  29.8C  Wrist *4.3 *4.2 0.1 26.2 23.4 10.7 Wrist Palm     Palm *2.1 *2.1 0.0 22.6 30.8 26.6       Radial Anti Sensory (Base 1st Digit)  31.2C  Wrist 2.2 2.5 0.3 19.1 35.4 46.0 Wrist Base 1st Digit     Ulnar Anti Sensory (5th Digit)  31.8C  Wrist *3.9 *4.1 0.2 24.4 25.4 3.9 Wrist 5th Digit *36 *34 2   Motor Left/Right Comparison   Stim Site L Lat (ms) R Lat (ms) L-R Lat (ms) L Amp (mV) R Amp (mV) L-R Amp (%) Site1 Site2 L Vel (m/s) R Vel (m/s) L-R Vel (m/s)  Median Motor (Abd Poll Brev)  30.6C  Wrist 4.0 4.0 0.0 5.9 7.9 25.3 Elbow Wrist *49 50 1  Elbow 8.7 8.8 0.1 5.5 7.6 27.6       Ulnar Motor (Abd Dig Min)  30C  Wrist 3.9 3.8 0.1 8.7 8.4 3.4 B Elbow Wrist *50 55 5  B Elbow 8.7 8.2 0.5 8.6 7.7 10.5 A Elbow B Elbow 53 62 9  A Elbow 10.6 9.8 0.8 8.1 7.3 9.9          Waveforms:                      Clinical History: No specialty comments available.     Objective:  VS:  HT:    WT:   BMI:     BP:   HR: bpm  TEMP: ( )  RESP:  Physical Exam Musculoskeletal:        General: No tenderness.     Comments: Inspection reveals no atrophy of the bilateral APB or FDI or hand intrinsics. There is no swelling, color changes, allodynia or dystrophic changes. There is 5 out of 5 strength in the bilateral wrist extension, finger abduction and long finger flexion. There is intact sensation to light touch in all dermatomal and peripheral nerve distributions. There is a negative Hoffmann's test bilaterally.  Skin:    General: Skin is warm and  dry.     Findings: No erythema or rash.  Neurological:     General: No focal deficit present.     Mental Status: He is alert and oriented to person, place, and time.     Sensory: No sensory deficit.     Motor: No weakness or abnormal muscle tone.     Coordination: Coordination normal.     Gait: Gait normal.  Psychiatric:        Mood and Affect: Mood normal.        Behavior: Behavior normal.        Thought Content: Thought content normal.      Imaging: No results found.

## 2022-04-16 NOTE — Procedures (Signed)
EMG & NCV Findings: Evaluation of the left median motor nerve showed decreased conduction velocity (Elbow-Wrist, 49 m/s).  The left ulnar motor nerve showed decreased conduction velocity (B Elbow-Wrist, 50 m/s).  The left median (across palm) sensory and the right median (across palm) sensory nerves showed prolonged distal peak latency (Wrist, L4.3, R4.2 ms) and prolonged distal peak latency (Palm, L2.1, R2.1 ms).  The left ulnar sensory and the right ulnar sensory nerves showed prolonged distal peak latency (L3.9, R4.1 ms) and decreased conduction velocity (Wrist-5th Digit, L36, R34 m/s).  All remaining nerves (as indicated in the following tables) were within normal limits.  All left vs. right side differences were within normal limits.    All examined muscles (as indicated in the following table) showed no evidence of electrical instability.    Impression: The above electrodiagnostic study is ABNORMAL and reveals evidence of a mild bilateral median nerve entrapment at the wrist (carpal tunnel syndrome) affecting sensory components.  There was also borderline slowing of the ulnar sensory nerves distally.  This is likely temperature artifact or technical artifact but could be an indication of a mild underlying sensory predominant polyneuropathy although this is very much less likely.  There is no significant electrodiagnostic evidence of any other focal nerve entrapment, brachial plexopathy or cervical radiculopathy in either upper limb.   Recommendations: 1.  Follow-up with referring physician. 2.  Continue current management of symptoms. 3.  Continue use of resting splint at night-time and as needed during the day.  ___________________________ Stephen Campbell Board Certified, American Board of Physical Medicine and Rehabilitation    Nerve Conduction Studies Anti Sensory Summary Table   Stim Site NR Peak (ms) Norm Peak (ms) P-T Amp (V) Norm P-T Amp Site1 Site2 Delta-P (ms) Dist (cm) Vel  (m/s) Norm Vel (m/s)  Left Median Acr Palm Anti Sensory (2nd Digit)  29.8C  Wrist    *4.3 <3.6 26.2 >10 Wrist Palm 2.2 0.0    Palm    *2.1 <2.0 22.6         Right Median Acr Palm Anti Sensory (2nd Digit)  29.1C  Wrist    *4.2 <3.6 23.4 >10 Wrist Palm 2.1 0.0    Palm    *2.1 <2.0 30.8         Left Radial Anti Sensory (Base 1st Digit)  31.2C  Wrist    2.2 <3.1 19.1  Wrist Base 1st Digit 2.2 0.0    Right Radial Anti Sensory (Base 1st Digit)  29C  Wrist    2.5 <3.1 35.4  Wrist Base 1st Digit 2.5 0.0    Left Ulnar Anti Sensory (5th Digit)  31.8C  Wrist    *3.9 <3.7 24.4 >15.0 Wrist 5th Digit 3.9 14.0 *36 >38  Right Ulnar Anti Sensory (5th Digit)  29.2C  Wrist    *4.1 <3.7 25.4 >15.0 Wrist 5th Digit 4.1 14.0 *34 >38   Motor Summary Table   Stim Site NR Onset (ms) Norm Onset (ms) O-P Amp (mV) Norm O-P Amp Site1 Site2 Delta-0 (ms) Dist (cm) Vel (m/s) Norm Vel (m/s)  Left Median Motor (Abd Poll Brev)  30.6C  Wrist    4.0 <4.2 5.9 >5 Elbow Wrist 4.7 23.0 *49 >50  Elbow    8.7  5.5         Right Median Motor (Abd Poll Brev)  29C  Wrist    4.0 <4.2 7.9 >5 Elbow Wrist 4.8 24.0 50 >50  Elbow    8.8  7.6  Left Ulnar Motor (Abd Dig Min)  30C  Wrist    3.9 <4.2 8.7 >3 B Elbow Wrist 4.8 24.0 *50 >53  B Elbow    8.7  8.6  A Elbow B Elbow 1.9 10.0 53 >53  A Elbow    10.6  8.1         Right Ulnar Motor (Abd Dig Min)  28.9C  Wrist    3.8 <4.2 8.4 >3 B Elbow Wrist 4.4 24.0 55 >53  B Elbow    8.2  7.7  A Elbow B Elbow 1.6 10.0 62 >53  A Elbow    9.8  7.3          EMG   Side Muscle Nerve Root Ins Act Fibs Psw Amp Dur Poly Recrt Int Fraser Din Comment  Left Abd Poll Brev Median C8-T1 Nml Nml Nml Nml Nml 0 Nml Nml   Left 1stDorInt Ulnar C8-T1 Nml Nml Nml Nml Nml 0 Nml Nml   Left PronatorTeres Median C6-7 Nml Nml Nml Nml Nml 0 Nml Nml   Left Biceps Musculocut C5-6 Nml Nml Nml Nml Nml 0 Nml Nml   Left Deltoid Axillary C5-6 Nml Nml Nml Nml Nml 0 Nml Nml     Nerve Conduction Studies Anti  Sensory Left/Right Comparison   Stim Site L Lat (ms) R Lat (ms) L-R Lat (ms) L Amp (V) R Amp (V) L-R Amp (%) Site1 Site2 L Vel (m/s) R Vel (m/s) L-R Vel (m/s)  Median Acr Palm Anti Sensory (2nd Digit)  29.8C  Wrist *4.3 *4.2 0.1 26.2 23.4 10.7 Wrist Palm     Palm *2.1 *2.1 0.0 22.6 30.8 26.6       Radial Anti Sensory (Base 1st Digit)  31.2C  Wrist 2.2 2.5 0.3 19.1 35.4 46.0 Wrist Base 1st Digit     Ulnar Anti Sensory (5th Digit)  31.8C  Wrist *3.9 *4.1 0.2 24.4 25.4 3.9 Wrist 5th Digit *36 *34 2   Motor Left/Right Comparison   Stim Site L Lat (ms) R Lat (ms) L-R Lat (ms) L Amp (mV) R Amp (mV) L-R Amp (%) Site1 Site2 L Vel (m/s) R Vel (m/s) L-R Vel (m/s)  Median Motor (Abd Poll Brev)  30.6C  Wrist 4.0 4.0 0.0 5.9 7.9 25.3 Elbow Wrist *49 50 1  Elbow 8.7 8.8 0.1 5.5 7.6 27.6       Ulnar Motor (Abd Dig Min)  30C  Wrist 3.9 3.8 0.1 8.7 8.4 3.4 B Elbow Wrist *50 55 5  B Elbow 8.7 8.2 0.5 8.6 7.7 10.5 A Elbow B Elbow 53 62 9  A Elbow 10.6 9.8 0.8 8.1 7.3 9.9          Waveforms:

## 2022-04-17 ENCOUNTER — Ambulatory Visit: Payer: No Typology Code available for payment source | Attending: Rheumatology | Admitting: Rheumatology

## 2022-04-17 ENCOUNTER — Ambulatory Visit (INDEPENDENT_AMBULATORY_CARE_PROVIDER_SITE_OTHER): Payer: No Typology Code available for payment source

## 2022-04-17 ENCOUNTER — Encounter: Payer: Self-pay | Admitting: Rheumatology

## 2022-04-17 VITALS — BP 124/79 | HR 84 | Resp 15 | Ht 72.0 in | Wt 190.4 lb

## 2022-04-17 DIAGNOSIS — Z8719 Personal history of other diseases of the digestive system: Secondary | ICD-10-CM

## 2022-04-17 DIAGNOSIS — M2241 Chondromalacia patellae, right knee: Secondary | ICD-10-CM

## 2022-04-17 DIAGNOSIS — M19071 Primary osteoarthritis, right ankle and foot: Secondary | ICD-10-CM

## 2022-04-17 DIAGNOSIS — Z79899 Other long term (current) drug therapy: Secondary | ICD-10-CM | POA: Diagnosis not present

## 2022-04-17 DIAGNOSIS — M5412 Radiculopathy, cervical region: Secondary | ICD-10-CM

## 2022-04-17 DIAGNOSIS — M542 Cervicalgia: Secondary | ICD-10-CM

## 2022-04-17 DIAGNOSIS — M5136 Other intervertebral disc degeneration, lumbar region: Secondary | ICD-10-CM

## 2022-04-17 DIAGNOSIS — Z85828 Personal history of other malignant neoplasm of skin: Secondary | ICD-10-CM

## 2022-04-17 DIAGNOSIS — Z7952 Long term (current) use of systemic steroids: Secondary | ICD-10-CM | POA: Diagnosis not present

## 2022-04-17 DIAGNOSIS — M19042 Primary osteoarthritis, left hand: Secondary | ICD-10-CM

## 2022-04-17 DIAGNOSIS — M316 Other giant cell arteritis: Secondary | ICD-10-CM

## 2022-04-17 DIAGNOSIS — Z1589 Genetic susceptibility to other disease: Secondary | ICD-10-CM

## 2022-04-17 DIAGNOSIS — M722 Plantar fascial fibromatosis: Secondary | ICD-10-CM

## 2022-04-17 DIAGNOSIS — R202 Paresthesia of skin: Secondary | ICD-10-CM

## 2022-04-17 DIAGNOSIS — Z8639 Personal history of other endocrine, nutritional and metabolic disease: Secondary | ICD-10-CM

## 2022-04-17 DIAGNOSIS — Z8659 Personal history of other mental and behavioral disorders: Secondary | ICD-10-CM

## 2022-04-17 DIAGNOSIS — Z8679 Personal history of other diseases of the circulatory system: Secondary | ICD-10-CM

## 2022-04-17 DIAGNOSIS — M7061 Trochanteric bursitis, right hip: Secondary | ICD-10-CM

## 2022-04-17 DIAGNOSIS — M7062 Trochanteric bursitis, left hip: Secondary | ICD-10-CM

## 2022-04-17 DIAGNOSIS — M19041 Primary osteoarthritis, right hand: Secondary | ICD-10-CM

## 2022-04-17 DIAGNOSIS — M7731 Calcaneal spur, right foot: Secondary | ICD-10-CM

## 2022-04-17 DIAGNOSIS — M7732 Calcaneal spur, left foot: Secondary | ICD-10-CM

## 2022-04-17 DIAGNOSIS — L409 Psoriasis, unspecified: Secondary | ICD-10-CM

## 2022-04-17 DIAGNOSIS — R7989 Other specified abnormal findings of blood chemistry: Secondary | ICD-10-CM

## 2022-04-17 DIAGNOSIS — M19072 Primary osteoarthritis, left ankle and foot: Secondary | ICD-10-CM

## 2022-04-17 DIAGNOSIS — Z8709 Personal history of other diseases of the respiratory system: Secondary | ICD-10-CM

## 2022-04-17 NOTE — Patient Instructions (Signed)
Standing Labs We placed an order today for your standing lab work.   Please have your standing labs drawn in December and every 3 months  Please have your labs drawn 2 weeks prior to your appointment so that the provider can discuss your lab results at your appointment.  Please note that you may see your imaging and lab results in [facility] before we have reviewed them. We will contact you once all results are reviewed. Please allow our office up to 72 hours to thoroughly review all of the results before contacting the office for clarification of your results.  Lab hours are:   Monday through Thursday from 8:00 am -12:30 pm and 1:00 pm-5:00 pm and Friday from 8:00 am-12:00 pm.  Please be advised, all patients with office appointments requiring lab work will take precedent over walk-in lab work.   Labs are drawn by [facility]. Please bring your co-pay at the time of your lab draw.  You may receive a bill from [facility] for your lab work.  Please note if you are on Hydroxychloroquine and and an order has been placed for a Hydroxychloroquine level, you will need to have it drawn 4 hours or more after your last dose.  If you wish to have your labs drawn at another location, please call the office 24 hours in advance so we can fax the orders.  The office is located at [address] [place], [place], [place] [address] No appointment is necessary.    If you have any questions regarding directions or hours of operation,  please call [phone].   As a reminder, please drink plenty of water prior to coming for your lab work. Thanks!   Vaccines You are taking a medication(s) that can suppress your immune system.  The following immunizations are recommended: Flu annually Covid-19  Td/Tdap (tetanus, diphtheria, pertussis) every 10 years Pneumonia (Prevnar 15 then Pneumovax 23 at least 1 year apart.  Alternatively, can take Prevnar 20 without needing additional dose) Shingrix: 2 doses from 4 weeks  to 6 months apart  Please check with your PCP to make sure you are up to date.   If you have signs or symptoms of an infection or start antibiotics: First, call your PCP for workup of your infection. Hold your medication through the infection, until you complete your antibiotics, and until symptoms resolve if you take the following: Injectable medication (Actemra, Benlysta, Cimzia, Cosentyx, Enbrel, Humira, Kevzara, Orencia, Remicade, Simponi, Stelara, Taltz, Tremfya) Methotrexate Leflunomide (Arava) Mycophenolate (Cellcept) Morrie Sheldon, Olumiant, or Rinvoq

## 2022-04-18 ENCOUNTER — Telehealth: Payer: Self-pay | Admitting: Pharmacist

## 2022-04-18 NOTE — Telephone Encounter (Addendum)
ATC patient. Copay card funds should reload with every calendar year  Left detailed VM and advised of above. Also asked him to reach out to [place] if he ever runs out of funds  Knox Saliva, PharmD, MPH, BCPS, CPP Clinical Pharmacist (Rheumatology and Pulmonology)   ----- Message from Carole Binning, LPN sent at 88/01/7578  3:35 PM EDT ----- Patient states he was made aware his co-pay assistance card is running low on funds. Wants to be sure of when funds will be reloaded.

## 2022-05-04 ENCOUNTER — Ambulatory Visit (HOSPITAL_COMMUNITY)
Admission: RE | Admit: 2022-05-04 | Discharge: 2022-05-04 | Disposition: A | Discharge status: Home / Self Care | Payer: No Typology Code available for payment source | Source: Ambulatory Visit | Attending: Rheumatology | Admitting: Rheumatology

## 2022-05-04 DIAGNOSIS — M5412 Radiculopathy, cervical region: Secondary | ICD-10-CM | POA: Diagnosis present

## 2022-05-04 DIAGNOSIS — M542 Cervicalgia: Secondary | ICD-10-CM | POA: Insufficient documentation

## 2022-05-06 NOTE — Progress Notes (Signed)
I left a message for the patient on the answering machine to call back.  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  He with need evaluation by a neurosurgeon.

## 2022-05-07 ENCOUNTER — Telehealth: Payer: Self-pay | Admitting: *Deleted

## 2022-05-07 DIAGNOSIS — M542 Cervicalgia: Secondary | ICD-10-CM

## 2022-05-07 DIAGNOSIS — M5412 Radiculopathy, cervical region: Secondary | ICD-10-CM

## 2022-05-07 DIAGNOSIS — M4800 Spinal stenosis, site unspecified: Secondary | ICD-10-CM

## 2022-05-07 DIAGNOSIS — M5136 Other intervertebral disc degeneration, lumbar region: Secondary | ICD-10-CM

## 2022-05-07 NOTE — Progress Notes (Signed)
I discussed the results with the patient.  Please refer him to neurosurgery Dr. Reatha Armour.

## 2022-05-07 NOTE — Telephone Encounter (Signed)
-----   Message from Bo Merino, MD sent at 05/07/2022 11:43 AM EST ----- I discussed the results with the patient.  Please refer him to neurosurgery Dr. Reatha Armour.

## 2022-05-15 ENCOUNTER — Other Ambulatory Visit: Payer: Self-pay | Admitting: Physician Assistant

## 2022-05-15 DIAGNOSIS — Z79899 Other long term (current) drug therapy: Secondary | ICD-10-CM

## 2022-05-15 DIAGNOSIS — M316 Other giant cell arteritis: Secondary | ICD-10-CM

## 2022-05-15 NOTE — Telephone Encounter (Signed)
Next Visit: 07/31/2022  Last Visit: 04/17/2022  Last Fill: 03/04/2022  YE:[id number] arteritis   Current Dose per office note 04/17/2022: Actemra 162 mg into the skin every 7 (seven) days   Labs: 02/21/2022 LFTs are elevated.   CBC is normal.  Sed rate normal.   TB Gold: 08/09/2021 Neg    Okay to refill Actemra?

## 2022-05-27 ENCOUNTER — Encounter: Payer: No Typology Code available for payment source | Admitting: Nurse Practitioner

## 2022-05-30 ENCOUNTER — Ambulatory Visit: Payer: No Typology Code available for payment source | Admitting: Nurse Practitioner

## 2022-05-30 ENCOUNTER — Encounter: Payer: Self-pay | Admitting: Nurse Practitioner

## 2022-05-30 VITALS — BP 112/82 | HR 75 | Temp 97.3°F | Ht 71.5 in | Wt 193.0 lb

## 2022-05-30 DIAGNOSIS — N401 Enlarged prostate with lower urinary tract symptoms: Secondary | ICD-10-CM

## 2022-05-30 DIAGNOSIS — Z0000 Encounter for general adult medical examination without abnormal findings: Secondary | ICD-10-CM | POA: Diagnosis not present

## 2022-05-30 DIAGNOSIS — Z136 Encounter for screening for cardiovascular disorders: Secondary | ICD-10-CM

## 2022-05-30 DIAGNOSIS — I10 Essential (primary) hypertension: Secondary | ICD-10-CM

## 2022-05-30 DIAGNOSIS — Z13220 Encounter for screening for lipoid disorders: Secondary | ICD-10-CM

## 2022-05-30 DIAGNOSIS — Z1329 Encounter for screening for other suspected endocrine disorder: Secondary | ICD-10-CM

## 2022-05-30 DIAGNOSIS — Z79899 Other long term (current) drug therapy: Secondary | ICD-10-CM | POA: Diagnosis not present

## 2022-05-30 DIAGNOSIS — Z131 Encounter for screening for diabetes mellitus: Secondary | ICD-10-CM | POA: Diagnosis not present

## 2022-05-30 DIAGNOSIS — J45909 Unspecified asthma, uncomplicated: Secondary | ICD-10-CM

## 2022-05-30 DIAGNOSIS — E559 Vitamin D deficiency, unspecified: Secondary | ICD-10-CM

## 2022-05-30 DIAGNOSIS — Z1389 Encounter for screening for other disorder: Secondary | ICD-10-CM | POA: Diagnosis not present

## 2022-05-30 DIAGNOSIS — E538 Deficiency of other specified B group vitamins: Secondary | ICD-10-CM

## 2022-05-30 DIAGNOSIS — Z130 Encounter for screening for diseases of the blood and blood-forming organs and certain disorders involving the immune mechanism: Secondary | ICD-10-CM

## 2022-05-30 DIAGNOSIS — L409 Psoriasis, unspecified: Secondary | ICD-10-CM

## 2022-05-30 DIAGNOSIS — Z23 Encounter for immunization: Secondary | ICD-10-CM | POA: Diagnosis not present

## 2022-05-30 DIAGNOSIS — M316 Other giant cell arteritis: Secondary | ICD-10-CM | POA: Diagnosis not present

## 2022-05-30 DIAGNOSIS — E291 Testicular hypofunction: Secondary | ICD-10-CM

## 2022-05-30 DIAGNOSIS — E785 Hyperlipidemia, unspecified: Secondary | ICD-10-CM

## 2022-05-30 DIAGNOSIS — R350 Frequency of micturition: Secondary | ICD-10-CM | POA: Diagnosis not present

## 2022-05-30 DIAGNOSIS — Z85828 Personal history of other malignant neoplasm of skin: Secondary | ICD-10-CM

## 2022-05-30 DIAGNOSIS — Z125 Encounter for screening for malignant neoplasm of prostate: Secondary | ICD-10-CM | POA: Diagnosis not present

## 2022-05-30 DIAGNOSIS — K2100 Gastro-esophageal reflux disease with esophagitis, without bleeding: Secondary | ICD-10-CM

## 2022-05-30 DIAGNOSIS — Z0001 Encounter for general adult medical examination with abnormal findings: Secondary | ICD-10-CM

## 2022-05-30 NOTE — Patient Instructions (Signed)

## 2022-05-30 NOTE — Progress Notes (Signed)
Complete Physical  Assessment and Plan:  Encounter for Annual Physical Exam with abnormal findings Due annually  Health Maintenance reviewed Healthy lifestyle reviewed and goals set  Primary hypertension Discussed DASH (Dietary Approaches to Stop Hypertension) DASH diet is lower in sodium than a typical American diet. Cut back on foods that are high in saturated fat, cholesterol, and trans fats. Eat more whole-grain foods, fish, poultry, and nuts Remain active and exercise as tolerated daily.  Monitor BP at home-Call if greater than 130/80.  Check CMP/CBC   Hyperlipidemia Discussed lifestyle modifications. Recommended diet heavy in fruits and veggies, omega 3's. Decrease consumption of animal meats, cheeses, and dairy products. Remain active and exercise as tolerated. Continue to monitor. Check lipids/TSH   Hypogonadism male Hypogonadism- continue replacement therapy, check testosterone levels as needed.    Asthma, mild intermittent, uncomplicated Controlled Continue inhaler as needed.  Depression, remission Off of meds, doing well, continues counseling for maintenance  Gastroesophageal reflux disease with esophagitis No suspected reflux complications (Barret/stricture). Lifestyle modification:  wt loss, avoid meals 2-3h before bedtime. Consider eliminating food triggers:  chocolate, caffeine, EtOH, acid/spicy food.   Erectile dysfunction, unspecified erectile dysfunction type Continue Levitra Discussed changing to a different medication if no longer effective.   Vitamin D deficiency Continue supplement Monitor levels  History of basal cell carcinoma Follows Derm, reminded to schedule  Psoriasis Follows derm annually and rheum PRN Continue Actemra  B12 deficiency -     Vitamin B12  Screening PSA (prostate specific antigen) -     PSA  Screening for cardiovascular condiiton Monitor EKG  Screening for DM Monitor A1c  Screening for hematuria and  proteinuria Monitor Microalbumin/UA  Screening for thyroid  Monitor TSH  Orders Placed This Encounter  Procedures   Flu Vaccine QUAD 36+ mos IM (Fluarix, Fluzone & Afluria Quad PF   CBC with Differential/Platelet   COMPLETE METABOLIC PANEL WITH GFR   Magnesium   Lipid panel   TSH   Hemoglobin A1c   Insulin, random   VITAMIN D 25 Hydroxy (Vit-D Deficiency, Fractures)   Urinalysis, Routine w reflex microscopic   Microalbumin / creatinine urine ratio   Vitamin B12   PSA   Testosterone   Sedimentation rate   C-reactive protein   EKG 12-Lead    Notify office for further evaluation and treatment, questions or concerns if any reported s/s fail to improve.   The patient was advised to call back or seek an in-person evaluation if any symptoms worsen or if the condition fails to improve as anticipated.   Further disposition pending results of labs. Discussed med's effects and SE's.    I discussed the assessment and treatment plan with the patient. The patient was provided an opportunity to ask questions and all were answered. The patient agreed with the plan and demonstrated an understanding of the instructions.  Discussed med's effects and SE's. Screening labs and tests as requested with regular follow-up as recommended.  I provided 35 minutes of face-to-face time during this encounter including counseling, chart review, and critical decision making was preformed.   Future Appointments  Date Time Provider [place]  07/31/2022  3:00 PM Bo Merino, MD [facility] None  06/02/2023  9:00 AM Darrol Jump, NP [facility] None    HPI 61 y.o. male presents for CPE. He has Plantar fasciitis; Allergy; Asthma; Depression; GERD (gastroesophageal reflux disease); ED (erectile dysfunction); Hypertension; Hyperlipidemia; Testicular hypofunction; DJD of shoulder; History of basal cell carcinoma; Psoriasis; Medication management; Vitamin D deficiency; B12 deficiency;  HLA B27 (HLA B27  positive); Temporal arteritis ([place]); Squamous cell carcinoma of right thigh; and Mild concentric left ventricular hypertrophy (LVH) on their problem list.   Married, no children, 2 dogs. He is a Pharmacist, community.  Questioning retirement.   He has history of HLAB27+, joint pain, has psoriasis and possible psoriatic arthritis, He follows intermittently with Dr. Patrecia Pour, at this time he is only doing topical treatment and mobic PRN, no DMARD.  Feels well controlled.  Hx of depression in remission off of meds, continues with counseling with Jackelyn Hoehn and finds this beneficial. Uses xanax rarely as needed. Denies extreme mood swings.   He has allergies, well controlled on current regimen.  Hx of GERD, known hiatal hernia per patient,  on omeprazole daily PM and famotidine PRN.  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 He continues to follow as scheduled.  BMI is Body mass index is 26.54 kg/m., he has been working on diet and exercise. Wt Readings from Last 3 Encounters:  05/30/22 193 lb (87.5 kg)  04/17/22 190 lb 6.4 oz (86.4 kg)  02/06/22 187 lb 3.2 oz (84.9 kg)    His blood pressure has been controlled at home (110s-120s/60s), he is taking 20 mg olmesartan, today their BP is BP: 112/82.  He does workout. He denies chest pain, shortness of breath, dizziness.  He had negative at home screening sleep study with dentist.  He is on cholesterol medication, atorvastatin 10 mg daily and denies myalgias. His cholesterol is at goal. The cholesterol last visit was:   Lab Results  Component Value Date   CHOL 176 11/29/2021   HDL 78 11/29/2021   LDLCALC 83 11/29/2021   TRIG 71 11/29/2021   CHOLHDL 2.3 11/29/2021    Last A1C in the office was:  Lab Results  Component Value Date   HGBA1C 5.6 09/12/2021   He has a history of testosterone deficiency and is on testosterone replacement (natesto topical- he reports does  forget to take occasionally). He states that the testosterone helps with his energy, libido, muscle mass. He has ED, back on levitra 20 mg which seemed to work better for him. Interested in changing medications d/t cost.  Lab Results  Component Value Date   TESTOSTERONE 499 05/24/2021    He does have ongoing dribbling (life long) and the stream has gotten mildly slower, denies sense of incomplete emptying.  Lab Results  Component Value Date   PSA 0.77 05/24/2021   PSA 0.88 05/18/2020   PSA 0.8 05/06/2019   Patient is on Vitamin D supplement.   Lab Results  Component Value Date   VD25OH 51 (L) 11/29/2021   He admits not regularly taking supplement -  Lab Results  Component Value Date   VITAMINB12 311 05/24/2021     Current Medications:  Current Outpatient Medications on File Prior to Visit  Medication Sig Dispense Refill   ACTEMRA ACTPEN 162 MG/0.9ML SOAJ INJECT 162MG SUBCUTANEOUSLY  EVERY WEEK 3.6 mL 2   albuterol (VENTOLIN HFA) 108 (90 BASE) MCG/ACT inhaler Inhale 2 puffs into the lungs every 4 (four) hours as needed for wheezing or shortness of breath. 1 Inhaler 3   atorvastatin (LIPITOR) 10 MG tablet TAKE 1 TABLET ONCE DAILY FOR CHOLESTEROL. 90 tablet 3   azelastine (ASTELIN) 0.1 % nasal spray Place 1 spray into both nostrils 2 (two) times daily.     diclofenac sodium (VOLTAREN) 1 % GEL  3 g to 3 large joints three times daily (Patient taking differently: as needed. 3 g to 3 large joints three times daily) 3 Tube 3   famotidine (PEPCID) 40 MG tablet TAKE 1 TABLET AT BEDTIME FOR ACID REFLUX AND INDIGESTION. 90 tablet 3   levocetirizine (XYZAL) 5 MG tablet Take 5 mg by mouth as needed.     MAGNESIUM PO Take by mouth daily.     NATESTO 5.5 MG/ACT GEL use 1 pump in each nostril three times daily *wash hands after use 21.96 g 5   olmesartan (BENICAR) 40 MG tablet Take 1 tablet (40 mg total) by mouth daily. 30 tablet 11   omeprazole (PRILOSEC) 40 MG capsule Take  1 capsule  Daily   to Prevent Heartburn & Indigestion 90 capsule 3   Polyethylene Glycol 3350 (MIRALAX PO) Take by mouth.     triamcinolone cream (KENALOG) 0.1 % Apply 1 application topically 2 (two) times daily. 45 g 1   vardenafil (LEVITRA) 20 MG tablet Take 1 tablet Daily if Needed for XXXX                                            /                    TAKE ONE TABLET BY MOUTH 30 tablet 1   predniSONE (DELTASONE) 1 MG tablet Take 4 tablets (4 mg total) by mouth daily with breakfast. 360 tablet 0   No current facility-administered medications on file prior to visit.   Health Maintenance:  Immunization History  Administered Date(s) Administered   Influenza Inj Mdck Quad Pf 04/24/2017   Influenza Inj Mdck Quad With Preservative 02/26/2018, 05/04/2020   Influenza Split 03/28/2013, 03/30/2014   Influenza, Seasonal, Injecte, Preservative Fre 05/04/2015   PFIZER Comirnaty(Gray Top)Covid-19 Tri-Sucrose Vaccine 03/21/2021   PFIZER(Purple Top)SARS-COV-2 Vaccination 06/24/2019, 07/15/2019, 06/10/2020   Tdap 02/20/2017   Tetanus: 2018 Prevnar 20: age 40 Flu vaccine: 03/2022 Shingrix: - discussed, will get at pharmacy  Covid 19: 2/2, 2021, pfizer   Colonoscopy: 02/23/2015 due 10 years EGD:  Last eye: 2023, annually, glasses, had ptosis  Last dental: 2023, q38m is a dentist  [facility] 20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   Temporal arteritis ([place] 07/11/2021   HLA B27 (HLA B27 positive) 11/16/2020   B12 deficiency 05/19/2020   Medication management 05/04/2019   Vitamin D deficiency 05/04/2019   History of basal cell carcinoma 02/09/2015   Psoriasis 02/09/2015   Testicular hypofunction 08/18/2014   DJD of shoulder 08/18/2014   Hypertension    Hyperlipidemia    Allergy    Asthma    Depression    GERD (gastroesophageal reflux  disease)    ED (erectile dysfunction)    Plantar fasciitis 01/06/2012   Allergies Allergies  Allergen Reactions   Enalapril Cough    SURGICAL HISTORY He  has a past surgical history that includes Vasectomy (2013); orthopedic surgery (Left); Hernia repair (Bilateral, 1970); LASIK; scalp biopsy; and Ptosis repair (Bilateral, 11/2020). FAMILY HISTORY His family history includes Ankylosing spondylitis in his brother; Aortic stenosis in his father; Asthma in his father; Cancer in his paternal grandmother; Heart attack in his paternal grandfather; Heart  disease in his brother; Kidney disease in his father; Rheum arthritis in his father; Uterine cancer in his mother. SOCIAL HISTORY He  reports that he has quit smoking. His smoking use included cigars. He has never been exposed to tobacco smoke. He has never used smokeless tobacco. He reports current alcohol use of about 14.0 standard drinks of alcohol per week. He reports that he does not use drugs.   Review of Systems  Constitutional:  Negative for malaise/fatigue and weight loss.  HENT:  Negative for congestion, hearing loss, sore throat and tinnitus.   Eyes:  Negative for blurred vision and double vision.  Respiratory:  Negative for cough, shortness of breath and wheezing.   Cardiovascular:  Negative for chest pain, palpitations, orthopnea, claudication and leg swelling.  Gastrointestinal:  Negative for abdominal pain, blood in stool, constipation, diarrhea, heartburn, melena, nausea and vomiting.  Genitourinary: Negative.   Musculoskeletal:  Negative for joint pain (intermittent R hip, working with massage) and myalgias.  Skin:  Negative for rash.  Neurological:  Negative for dizziness, tingling, sensory change, weakness and headaches.  Endo/Heme/Allergies:  Negative for polydipsia.  Psychiatric/Behavioral:  Negative for depression, memory loss and suicidal ideas. The patient is nervous/anxious (intermittent). The patient does not have  insomnia.   All other systems reviewed and are negative.   Physical Exam: Estimated body mass index is 26.54 kg/m as calculated from the following:   Height as of this encounter: 5' 11.5" (1.816 m).   Weight as of this encounter: 193 lb (87.5 kg). BP 112/82   Pulse 75   Temp (!) 97.3 F (36.3 C)   Ht 5' 11.5" (1.816 m)   Wt 193 lb (87.5 kg)   SpO2 98%   BMI 26.54 kg/m  General Appearance: Well nourished, in no apparent distress. Eyes: PERRLA, EOMs, conjunctiva no swelling or erythema. Mild left lid ptosis.  Sinuses: No Frontal/maxillary tenderness ENT/Mouth: Ext aud canals clear, normal light reflex with TMs without erythema, bulging. Good dentition. No erythema, swelling, or exudate on post pharynx. Tonsils not swollen or erythematous. Hearing normal.  Neck: Supple, thyroid normal. No bruits Respiratory: Respiratory effort normal, BS equal bilaterally without rales, rhonchi, wheezing or stridor. Cardio: RRR without murmurs, rubs or gallops. Brisk peripheral pulses without edema.  Chest: symmetric, with normal excursions and percussion. Abdomen: Soft, +BS. Non tender, no guarding, rebound, hernias, masses, or organomegaly.  Lymphatics: Non tender without lymphadenopathy.  Genitourinary: defer Musculoskeletal: Full ROM all peripheral extremities,5/5 strength, and normal gait. Mildly tender R medial epicondyle without erythema or swelling. Wrist full ROM without crepitus, bony enlargement, neg phalen's, intact strength and distal sensation Skin: Warm, dry without rashes, lesions, ecchymosis.  Neuro: Cranial nerves intact, reflexes equal bilaterally. Normal muscle tone, no cerebellar symptoms. Sensation intact.  Psych: Awake and oriented X 3, normal affect, Insight and Judgment appropriate.   EKG: Sinus rhythm, WNL  Gricelda Foland 9:12 AM

## 2022-05-31 LAB — MICROALBUMIN / CREATININE URINE RATIO
Creatinine, Urine: 124 mg/dL (ref 20–320)
Microalb Creat Ratio: 6 mcg/mg creat (ref <30)
Microalb, Ur: 0.7 mg/dL

## 2022-05-31 LAB — CBC WITH DIFFERENTIAL/PLATELET
Absolute Monocytes: 404 cells/uL (ref 200–950)
Basophils Absolute: 72 cells/uL (ref 0–200)
Basophils Relative: 1.8 %
Eosinophils Absolute: 300 cells/uL (ref 15–500)
Eosinophils Relative: 7.5 %
HCT: 41.9 % (ref 38.5–50.0)
Hemoglobin: 14.3 g/dL (ref 13.2–17.1)
Lymphs Abs: 1416 cells/uL (ref 850–3900)
MCH: 32.8 pg (ref 27.0–33.0)
MCHC: 34.1 g/dL (ref 32.0–36.0)
MCV: 96.1 fL (ref 80.0–100.0)
MPV: 11.2 fL (ref 7.5–12.5)
Monocytes Relative: 10.1 %
Neutro Abs: 1808 cells/uL (ref 1500–7800)
Neutrophils Relative %: 45.2 %
Platelets: 209 10*3/uL (ref 140–400)
RBC: 4.36 10*6/uL (ref 4.20–5.80)
RDW: 11.7 % (ref 11.0–15.0)
Total Lymphocyte: 35.4 %
WBC: 4 10*3/uL (ref 3.8–10.8)

## 2022-05-31 LAB — LIPID PANEL
Cholesterol: 170 mg/dL (ref <200)
HDL: 59 mg/dL (ref >=40)
LDL Cholesterol (Calc): 92 mg/dL (calc)
Non-HDL Cholesterol (Calc): 111 mg/dL (calc) (ref <130)
Total CHOL/HDL Ratio: 2.9 (calc) (ref <5.0)
Triglycerides: 91 mg/dL (ref <150)

## 2022-05-31 LAB — HEMOGLOBIN A1C
Hgb A1c MFr Bld: 5.4 % of total Hgb (ref <5.7)
Mean Plasma Glucose: 108 mg/dL
eAG (mmol/L): 6 mmol/L

## 2022-05-31 LAB — COMPLETE METABOLIC PANEL WITH GFR
AG Ratio: 1.9 (calc) (ref 1.0–2.5)
ALT: 41 U/L (ref 9–46)
AST: 29 U/L (ref 10–35)
Albumin: 4.6 g/dL (ref 3.6–5.1)
Alkaline phosphatase (APISO): 31 U/L — ABNORMAL LOW (ref 35–144)
BUN/Creatinine Ratio: 20 (calc) (ref 6–22)
BUN: 27 mg/dL — ABNORMAL HIGH (ref 7–25)
CO2: 26 mmol/L (ref 20–32)
Calcium: 9.3 mg/dL (ref 8.6–10.3)
Chloride: 104 mmol/L (ref 98–110)
Creat: 1.32 mg/dL — ABNORMAL HIGH (ref 0.70–1.30)
Globulin: 2.4 g/dL (calc) (ref 1.9–3.7)
Glucose, Bld: 102 mg/dL — ABNORMAL HIGH (ref 65–99)
Potassium: 4.8 mmol/L (ref 3.5–5.3)
Sodium: 138 mmol/L (ref 135–146)
Total Bilirubin: 1.1 mg/dL (ref 0.2–1.2)
Total Protein: 7 g/dL (ref 6.1–8.1)
eGFR: 62 mL/min/{1.73_m2} (ref >=60)

## 2022-05-31 LAB — URINALYSIS, ROUTINE W REFLEX MICROSCOPIC
Bilirubin Urine: NEGATIVE
Glucose, UA: NEGATIVE
Hgb urine dipstick: NEGATIVE
Ketones, ur: NEGATIVE
Leukocytes,Ua: NEGATIVE
Nitrite: NEGATIVE
Protein, ur: NEGATIVE
Specific Gravity, Urine: 1.016 (ref 1.001–1.035)
pH: 5.5 (ref 5.0–8.0)

## 2022-05-31 LAB — VITAMIN D 25 HYDROXY (VIT D DEFICIENCY, FRACTURES): Vit D, 25-Hydroxy: 33 ng/mL (ref 30–100)

## 2022-05-31 LAB — PSA: PSA: 1.05 ng/mL (ref <=4.00)

## 2022-05-31 LAB — MAGNESIUM: Magnesium: 1.8 mg/dL (ref 1.5–2.5)

## 2022-05-31 LAB — INSULIN, RANDOM: Insulin: 9.6 u[IU]/mL

## 2022-05-31 LAB — SEDIMENTATION RATE: Sed Rate: 2 mm/h (ref 0–20)

## 2022-05-31 LAB — TESTOSTERONE: Testosterone: 668 ng/dL (ref 250–827)

## 2022-05-31 LAB — TSH: TSH: 0.86 mIU/L (ref 0.40–4.50)

## 2022-05-31 LAB — VITAMIN B12: Vitamin B-12: 334 pg/mL (ref 200–1100)

## 2022-05-31 LAB — C-REACTIVE PROTEIN: CRP: <0.2 mg/L (ref <8.0)

## 2022-06-02 NOTE — Progress Notes (Signed)
CBC and lipid panel are within normal limits. Creatinine is mildly elevated. Pt. should avoid all NSAID and increase water intake.

## 2022-06-03 ENCOUNTER — Encounter: Payer: Self-pay | Admitting: Nurse Practitioner

## 2022-06-03 DIAGNOSIS — N529 Male erectile dysfunction, unspecified: Secondary | ICD-10-CM

## 2022-06-05 MED ORDER — VARDENAFIL HCL 20 MG PO TABS: ORAL_TABLET | ORAL | 1 refills | Status: AC

## 2022-06-10 ENCOUNTER — Other Ambulatory Visit: Payer: Self-pay

## 2022-06-10 DIAGNOSIS — I10 Essential (primary) hypertension: Secondary | ICD-10-CM

## 2022-06-10 MED ORDER — ATORVASTATIN CALCIUM 10 MG PO TABS: ORAL_TABLET | ORAL | 3 refills | Status: DC

## 2022-06-10 MED ORDER — OLMESARTAN MEDOXOMIL 40 MG PO TABS: 40.0000 mg | ORAL_TABLET | Freq: Every day | ORAL | 11 refills | Status: DC

## 2022-06-17 ENCOUNTER — Telehealth: Payer: Self-pay

## 2022-06-17 NOTE — Telephone Encounter (Signed)
Refill request for [facility]

## 2022-07-10 ENCOUNTER — Ambulatory Visit: Payer: No Typology Code available for payment source | Admitting: Internal Medicine

## 2022-07-10 ENCOUNTER — Other Ambulatory Visit: Payer: Self-pay

## 2022-07-10 ENCOUNTER — Encounter: Payer: Self-pay | Admitting: Internal Medicine

## 2022-07-10 VITALS — BP 140/80 | HR 90 | Temp 98.0°F | Resp 17 | Ht 71.5 in | Wt 188.6 lb

## 2022-07-10 DIAGNOSIS — J069 Acute upper respiratory infection, unspecified: Secondary | ICD-10-CM | POA: Diagnosis not present

## 2022-07-10 DIAGNOSIS — J0410 Acute tracheitis without obstruction: Secondary | ICD-10-CM

## 2022-07-10 DIAGNOSIS — G4483 Primary cough headache: Secondary | ICD-10-CM | POA: Diagnosis not present

## 2022-07-10 LAB — POC INFLUENZA A&B (BINAX/QUICKVUE)
Influenza A, POC: NEGATIVE
Influenza B, POC: NEGATIVE

## 2022-07-10 LAB — POC COVID19 BINAXNOW: SARS Coronavirus 2 Ag: NEGATIVE

## 2022-07-10 MED ORDER — DEXAMETHASONE 4 MG PO TABS: ORAL_TABLET | ORAL | 0 refills | Status: DC

## 2022-07-10 MED ORDER — ALBUTEROL SULFATE HFA 108 (90 BASE) MCG/ACT IN AERS: INHALATION_SPRAY | RESPIRATORY_TRACT | 3 refills | Status: AC

## 2022-07-10 MED ORDER — BENZONATATE 200 MG PO CAPS: ORAL_CAPSULE | ORAL | 1 refills | Status: DC

## 2022-07-10 NOTE — Progress Notes (Signed)
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   Sx' s initially improved, but have persisted.   Covid & Flu A/B are Negative today.    Current Outpatient Medications on File Prior to Visit  Medication Sig   ACTEMRA ACTPEN 162 MG/0.9ML SOAJ INJECT '162MG'$  SUBCUTANEOUSLY  EVERY WEEK   VENTOLIN HFA  inhaler Inhale 2 puffs  every 4 hours as needed   atorvastatin 10 MG tablet TAKE 1 TABLET ONCE DAILY FOR CHOLESTEROL.   azelastine  0.1 % nasal spray Place 1 spray into both nostrils 2 (two) times daily.   diclofenac s 1 % GEL 3 g to 3 large joints three times daily (Patient taking differently: as needed. 3 g to 3 large joints three times daily)   famotidine (PEPCID) 40 MG tablet TAKE 1 TABLET AT BEDTIME FOR ACID REFLUX AND INDIGESTION.   levocetirizine (XYZAL) 5 MG tablet Take 5 mg by mouth as needed.   MAGNESIUM PO Take by mouth daily.   NATESTO 5.5 MG/ACT GEL use 1 pump in each nostril three times daily *wash hands after use   olmesartan (BENICAR) 40 MG tablet Take 1 tablet (40 mg total) by mouth daily.   omeprazole (PRILOSEC) 40 MG capsule Take  1 capsule  Daily  to Prevent Heartburn & Indigestion   Polyethylene Glycol 3350 (MIRALAX PO) Take by mouth.   triamcinolone cream (KENALOG) 0.1 % Apply 1 application topically 2 (two) times daily.   vardenafil (LEVITRA) 20 MG tablet Take 1 tablet Daily if Needed for XXXX                                            /                    TAKE ONE TABLET BY MOUTH     Allergies   Allergen Reactions   Enalapril Cough     Problem list He has Plantar fasciitis; Allergy; Asthma; Depression; GERD (gastroesophageal reflux disease); ED (erectile dysfunction); Hypertension; Hyperlipidemia; Testicular hypofunction; DJD of shoulder; History of basal cell carcinoma; Psoriasis; Medication management; Vitamin D deficiency; B12 deficiency; HLA B27 (HLA B27 positive); Temporal arteritis ([place]); Squamous cell carcinoma of right thigh; and Mild concentric left ventricular hypertrophy (LVH) on their problem list.   Observations/Objective:  BP (!) 140/80   Pulse 90   Temp 98 F (36.7 C)   Resp 17   Ht 5' 11.5" (1.816 m)   Wt 188 lb 9.6 oz (85.5 kg)   SpO2 96%   BMI 25.94 kg/m   Tracheal dry cough. No Stridor  HEENT - WNL. Neck - supple.  Chest - Equal BS.   ? Forced end post tussive end terminal wheezes.  Cor - Nl HS. RRR w/o sig MGR. PP 1(+). No edema. MS- FROM w/o deformities.  Gait Nl. Neuro -  Nl w/o focal abnormalities.  Assessment and Plan:  1. Upper respiratory tract infection, unspecified type  - dexamethasone (DECADRON) 4 MG tablet; Take 1 tab 3 x day - 3 days, then 2 x day - 3 days, then 1 tab daily  Dispense: 20 tablet; Refill: 0 - albuterol (VENTOLIN HFA) 108 (90 Base) MCG/ACT inhaler; Use 2 inhalations    15 minutes apart     every 4 hours     to rescue Asthma Attack  Dispense: 48 g; Refill: 3  2. Tracheitis  - dexamethasone  4 MG tab Take 1 tab 3 x day - 3 days, then 2 x day - 3 days, then 1 tab daily   Dispense: 20 tablet  - albuterol HFA inhaler;  Use 2 inhalations  every 4 hours to rescue Asthma Attack    3. Cough headache  - POC Influenza A&B (Binax test) - -> Negative - POC COVID-19 -->  Negative  Follow Up Instructions:        I discussed the assessment and treatment plan with the patient. The patient was provided an opportunity to ask questions and all were answered. The patient agreed with the plan and demonstrated an understanding  of the instructions.       The patient was advised to call back or seek an in-person evaluation if the symptoms worsen or if the condition fails to improve as anticipated.   Kirtland Bouchard, MD

## 2022-07-17 NOTE — Progress Notes (Signed)
Office Visit Note  Patient: Stephen Campbell             Date of Birth: 04/23/1961           MRN: [id number]             PCP: Unk Pinto, MD Referring: Unk Pinto, MD Visit Date: 07/31/2022 Occupation: @GUAROCC$ @  Subjective:  Medication management  History of Present Illness: Sharod Zimmerly is a 62 y.o. male with history of temporal arteritis, osteoarthritis and psoriasis.  He had been taking Actemra on a regular basis until December 2023 .  Left COVID-19 virus infection around Christmas and had to come off Actemra for 1 week.  He states his symptoms got worse towards the end of January and was given a prednisone taper for about 10 days.  He skipped Actemra for 2 more doses towards the end of January.  He took most recent Actemra dose about days ago.  Not had a flare of temporal arteritis.  He was evaluated by Dr. Reatha Armour for cervical radiculopathy.  He for physical therapy currently.  He is not experiencing much numbness currently.  Intermittent flares of psoriasis.  He has not had recent episodes of [place] fasciitis.    Activities of Daily Living:  Patient reports morning stiffness for 10 minutes.   Patient Reports nocturnal pain.  Difficulty dressing/grooming: Denies Difficulty climbing stairs: Denies Difficulty getting out of chair: Denies Difficulty using hands for taps, buttons, cutlery, and/or writing: Denies  Review of Systems  Constitutional:  Negative for fatigue.  HENT:  Negative for mouth dryness.   Eyes:  Positive for dryness.  Respiratory:  Positive for shortness of breath.        Recent flare of asthma  Cardiovascular:  Negative for chest pain and palpitations.  Gastrointestinal:  Negative for blood in stool, constipation and diarrhea.  Endocrine: Negative for increased urination.  Genitourinary:  Negative for difficulty urinating.  Musculoskeletal:  Positive for myalgias, morning stiffness and myalgias. Negative for joint pain and joint pain.       Related to  work  Skin:  Positive for rash. Negative for color change and sensitivity to sunlight.  Allergic/Immunologic: Negative for susceptible to infections.  Neurological:  Negative for weakness.  Hematological:  Negative for swollen glands.  Psychiatric/Behavioral:  Positive for sleep disturbance. Negative for depressed mood. The patient is not nervous/anxious.     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/18/2014   Hypertension    Hyperlipidemia    Allergy    Asthma    Depression    GERD (gastroesophageal reflux disease)    ED (erectile dysfunction)    Plantar fasciitis 01/06/2012    Past Medical History:  Diagnosis Date   Allergy    Anxiety    Asthma    Basal cell carcinoma    Depression    ED (erectile dysfunction)    GERD (gastroesophageal reflux disease)    Hyperlipidemia    Hypertension    Hypogonadism in male    Osteoarthritis    Temporal arteritis ([place])  Vitamin D deficiency     Family History  Problem Relation Age of Onset   Uterine cancer Mother    Asthma Father    Kidney disease Father    Rheum arthritis Father    Aortic stenosis Father        valve replacement   Ankylosing spondylitis Brother    Heart disease Brother    Cancer Paternal Grandmother    Heart attack Paternal Grandfather    Colon cancer Neg Hx    Colon polyps Neg Hx    Gallbladder disease Neg Hx    Esophageal cancer Neg Hx    Diabetes Neg Hx    Past Surgical History:  Procedure Laterality Date   HERNIA REPAIR Bilateral 1970   62 yrs old, inguinal     LASIK     ORTHOPEDIC SURGERY Left    left shoulder, bone spur, Dr. Denice Bors   PTOSIS REPAIR Bilateral 11/2020   Dr. Toy Cookey - [facility]   scalp biopsy     basal cell    VASECTOMY  2013   Social History   Social History Narrative   Not on file   Immunization History  Administered Date(s) Administered   Influenza Inj Mdck Quad Pf 04/24/2017   Influenza Inj Mdck Quad With Preservative 02/26/2018, 05/04/2020   Influenza Split 03/28/2013, 03/30/2014   Influenza, Seasonal, Injecte, Preservative Fre 05/04/2015   Influenza,inj,Quad PF,6+ Mos 05/30/2022   PFIZER Comirnaty(Gray Top)Covid-19 Tri-Sucrose Vaccine 03/21/2021   PFIZER(Purple Top)SARS-COV-2 Vaccination 06/24/2019, 07/15/2019, 06/10/2020   Tdap 02/20/2017     Objective: Vital Signs: BP 138/80 (BP Location: Right Arm, Patient Position: Sitting, Cuff Size: Normal)   Pulse 87   Resp 12   Ht 5' 11.5" (1.816 m)   Wt 186 lb (84.4 kg)   BMI 25.58 kg/m    Physical Exam Vitals and nursing note reviewed.  Constitutional:      Appearance: He is well-developed.  HENT:     Head: Normocephalic and atraumatic.  Eyes:     Conjunctiva/sclera: Conjunctivae normal.     Pupils: Pupils are equal, round, and reactive to light.  Cardiovascular:     Rate and Rhythm: Normal rate and regular rhythm.     Heart sounds: Normal heart sounds.  Pulmonary:     Effort: Pulmonary effort is normal.     Breath sounds: Normal breath sounds.  Abdominal:     General: Bowel sounds are normal.     Palpations: Abdomen is soft.  Musculoskeletal:     Cervical back: Normal range of motion and neck supple.  Skin:    General: Skin is warm and dry.     Capillary Refill: Capillary refill takes less than 2 seconds.  Neurological:     Mental Status: He is alert and oriented to person, place, and time.  Psychiatric:        Behavior: Behavior normal.      Musculoskeletal Exam: He had limited range of motion of the cervical spine with some  stiffness.  Thoracic and lumbar spine were in good range of motion.  He had no SI joint tenderness.  Shoulder joints, elbow joints, wrist joints, MCPs PIPs and DIPs been good range of motion with no synovitis.  Hip joints and knee joints were in good range of motion.  He had no tenderness over ankles or MTPs.  CDAI Exam: CDAI Score: -- Patient Global: --; Provider Global: -- Swollen: --; Tender: -- Joint Exam 07/31/2022   No joint exam has been documented for  this visit   There is currently no information documented on the homunculus. Go to the Rheumatology activity and complete the homunculus joint exam.  Investigation: No additional findings.  Imaging: No results found.  Recent Labs: Lab Results  Component Value Date   WBC 4.0 05/30/2022   HGB 14.3 05/30/2022   PLT 209 05/30/2022   NA 138 05/30/2022   K 4.8 05/30/2022   CL 104 05/30/2022   CO2 26 05/30/2022   GLUCOSE 102 (H) 05/30/2022   BUN 27 (H) 05/30/2022   CREATININE 1.32 (H) 05/30/2022   BILITOT 1.1 05/30/2022   ALKPHOS 39 (L) 08/15/2016   AST 29 05/30/2022   ALT 41 05/30/2022   PROT 7.0 05/30/2022   ALBUMIN 4.4 08/15/2016   CALCIUM 9.3 05/30/2022   GFRAA 101 11/16/2020   QFTBGOLDPLUS NEGATIVE 08/09/2021    Speciality Comments: Actemra started 08/15/21  Procedures:  No procedures performed Allergies: Enalapril   Assessment / Plan:     Visit Diagnoses: Temporal arteritis ([place]) - he presented with temporal headaches in January 2023. -He has been off prednisone since October 2023.  He had to take a prednisone taper at the end of January due to bronchitis and asthma flare.  He continues to take Actemra 162 mg subcu every 7 days.  He had to stop Actemra when he had COVID-19 virus infection in December.  He also had another interruption while he was traveling.  He resumed Actemra about a week ago.  He states he had mild headache which he relates to eating certain food.Marland Kitchen  He did not have any temporal artery tenderness.   Plan: Sedimentation rate  High risk medication use - Actemra 162 mg into the skin every 7 (seven) days.  He has been off prednisone since April 03, 2022. -May 30, 2022 CBC was normal CMP showed creatinine elevation at 1.32.  Will recheck labs next month.  Plan: CBC with Differential/Platelet, COMPLETE METABOLIC PANEL WITH GFR.  Information about immunization was placed in the AVS.  He was also advised to hold Actemra if he develops an infection and resume after the infection resolves.  Long term (current) use of systemic steroids-patient was on prednisone for several months.  Will check DEXA scan to evaluate BMD.  Osteoporosis screening-will get DEXA scan.  Elevated LFTs-LFTs are normal now.  Elevated serum creatinine-creatinine was elevated at 1.32 on May 30, 2022.  Will recheck labs in March.  Psoriasis-he gets occasional lesions which respond to topical therapy.  HLA B27 positive  Primary osteoarthritis of both hands-had bilateral PIP and DIP thickening.  No synovitis was noted.  Paresthesia of both hands - nerve conduction velocities done by Dr. Ernestina Patches on April 11, 2022 showed bilateral mild median nerve entrapment.  Trochanteric bursitis of both hips-he continues to have some discomfort in the trochanteric region.  IT band stretches were discussed.  Chondromalacia patellae, right knee-intermittent pain.  Lower extremity strength exercises were discussed.  Primary osteoarthritis of both feet-symptoms have improved since he has been using proper fitting shoes.  Plantar fasciitis-he denies any recent episodes.  Bilateral calcaneal spurs  DDD (degenerative disc disease), cervical-he had limited range of motion of the cervical spine.  He has been seeing Dr. Reatha Armour.  He is going to physical therapy currently.  DDD (degenerative disc disease), lumbar-he has intermittent discomfort.  History of hypertension-blood pressure was 130/80 today.  History of anxiety-related  to the diagnosis.  History of asthma-he had recent DEXA the patient after COVID-19 virus infection in December.  History  of basal cell carcinoma - followed by dermatologist.  History of hypercholesterolemia  History of gastroesophageal reflux (GERD)  Vitamin D deficiency-his vitamin D was low in June 2023.  He is not taking vitamin D.  He was advised to take vitamin D 2000 units daily.  Orders: Orders Placed This Encounter  Procedures   CBC with Differential/Platelet   COMPLETE METABOLIC PANEL WITH GFR   Sedimentation rate   No orders of the defined types were placed in this encounter.   Follow-Up Instructions: Return in about 5 months (around 12/29/2022) for Temporal arteritis.   Bo Merino, MD  Note - This record has been created using Editor, commissioning.  Chart creation errors have been sought, but may not always  have been located. Such creation errors do not reflect on  the standard of medical care.

## 2022-07-21 ENCOUNTER — Other Ambulatory Visit: Payer: Self-pay | Admitting: Physician Assistant

## 2022-07-21 DIAGNOSIS — M316 Other giant cell arteritis: Secondary | ICD-10-CM

## 2022-07-21 DIAGNOSIS — Z79899 Other long term (current) drug therapy: Secondary | ICD-10-CM

## 2022-07-21 NOTE — Telephone Encounter (Signed)
Next Visit: 07/31/2022  Last Visit: 04/17/2022  Last Fill: 05/15/2022  XF:FKVQOHCO arteritis   Current Dose per office note on 04/17/2022: Actemra 162 mg into the skin every 7 (seven) days.   Labs: 05/30/2022 CBC and lipid panel are within normal limits. Creatinine is mildly elevated. Pt. should avoid all NSAID and increase water intake.   TB Gold: 08/09/2021 negative    Okay to refill actemra?

## 2022-07-31 ENCOUNTER — Encounter: Payer: Self-pay | Admitting: Rheumatology

## 2022-07-31 ENCOUNTER — Ambulatory Visit: Payer: No Typology Code available for payment source | Attending: Rheumatology | Admitting: Rheumatology

## 2022-07-31 ENCOUNTER — Other Ambulatory Visit: Payer: Self-pay | Admitting: *Deleted

## 2022-07-31 VITALS — BP 138/80 | HR 87 | Resp 12 | Ht 71.5 in | Wt 186.0 lb

## 2022-07-31 DIAGNOSIS — Z13820 Encounter for screening for osteoporosis: Secondary | ICD-10-CM

## 2022-07-31 DIAGNOSIS — M7732 Calcaneal spur, left foot: Secondary | ICD-10-CM

## 2022-07-31 DIAGNOSIS — M7061 Trochanteric bursitis, right hip: Secondary | ICD-10-CM

## 2022-07-31 DIAGNOSIS — Z8709 Personal history of other diseases of the respiratory system: Secondary | ICD-10-CM

## 2022-07-31 DIAGNOSIS — M51369 Other intervertebral disc degeneration, lumbar region without mention of lumbar back pain or lower extremity pain: Secondary | ICD-10-CM

## 2022-07-31 DIAGNOSIS — M19071 Primary osteoarthritis, right ankle and foot: Secondary | ICD-10-CM

## 2022-07-31 DIAGNOSIS — M19072 Primary osteoarthritis, left ankle and foot: Secondary | ICD-10-CM

## 2022-07-31 DIAGNOSIS — M5136 Other intervertebral disc degeneration, lumbar region: Secondary | ICD-10-CM

## 2022-07-31 DIAGNOSIS — M5030 Other cervical disc degeneration, unspecified cervical region: Secondary | ICD-10-CM

## 2022-07-31 DIAGNOSIS — Z85828 Personal history of other malignant neoplasm of skin: Secondary | ICD-10-CM

## 2022-07-31 DIAGNOSIS — M5412 Radiculopathy, cervical region: Secondary | ICD-10-CM

## 2022-07-31 DIAGNOSIS — M316 Other giant cell arteritis: Secondary | ICD-10-CM | POA: Diagnosis not present

## 2022-07-31 DIAGNOSIS — R7989 Other specified abnormal findings of blood chemistry: Secondary | ICD-10-CM

## 2022-07-31 DIAGNOSIS — Z79899 Other long term (current) drug therapy: Secondary | ICD-10-CM

## 2022-07-31 DIAGNOSIS — Z1589 Genetic susceptibility to other disease: Secondary | ICD-10-CM

## 2022-07-31 DIAGNOSIS — E559 Vitamin D deficiency, unspecified: Secondary | ICD-10-CM

## 2022-07-31 DIAGNOSIS — M19041 Primary osteoarthritis, right hand: Secondary | ICD-10-CM

## 2022-07-31 DIAGNOSIS — L409 Psoriasis, unspecified: Secondary | ICD-10-CM

## 2022-07-31 DIAGNOSIS — Z7952 Long term (current) use of systemic steroids: Secondary | ICD-10-CM

## 2022-07-31 DIAGNOSIS — Z8659 Personal history of other mental and behavioral disorders: Secondary | ICD-10-CM

## 2022-07-31 DIAGNOSIS — Z8679 Personal history of other diseases of the circulatory system: Secondary | ICD-10-CM

## 2022-07-31 DIAGNOSIS — R202 Paresthesia of skin: Secondary | ICD-10-CM

## 2022-07-31 DIAGNOSIS — M542 Cervicalgia: Secondary | ICD-10-CM

## 2022-07-31 DIAGNOSIS — M2241 Chondromalacia patellae, right knee: Secondary | ICD-10-CM

## 2022-07-31 DIAGNOSIS — M7731 Calcaneal spur, right foot: Secondary | ICD-10-CM

## 2022-07-31 DIAGNOSIS — Z8719 Personal history of other diseases of the digestive system: Secondary | ICD-10-CM

## 2022-07-31 DIAGNOSIS — Z8639 Personal history of other endocrine, nutritional and metabolic disease: Secondary | ICD-10-CM

## 2022-07-31 DIAGNOSIS — M722 Plantar fascial fibromatosis: Secondary | ICD-10-CM

## 2022-07-31 DIAGNOSIS — M19042 Primary osteoarthritis, left hand: Secondary | ICD-10-CM

## 2022-07-31 DIAGNOSIS — M7062 Trochanteric bursitis, left hip: Secondary | ICD-10-CM

## 2022-07-31 NOTE — Patient Instructions (Signed)
Standing Labs We placed an order today for your standing lab work.   Please have your standing labs drawn in March and every 3 months  Please have your labs drawn 2 weeks prior to your appointment so that the provider can discuss your lab results at your appointment.  Please note that you may see your imaging and lab results in [facility] before we have reviewed them. We will contact you once all results are reviewed. Please allow our office up to 72 hours to thoroughly review all of the results before contacting the office for clarification of your results.  Lab hours are:   Monday through Thursday from 8:00 am -12:30 pm and 1:00 pm-5:00 pm and Friday from 8:00 am-12:00 pm.  Please be advised, all patients with office appointments requiring lab work will take precedent over walk-in lab work.   Labs are drawn by [facility]. Please bring your co-pay at the time of your lab draw.  You may receive a bill from [facility] for your lab work.  Please note if you are on Hydroxychloroquine and and an order has been placed for a Hydroxychloroquine level, you will need to have it drawn 4 hours or more after your last dose.  If you wish to have your labs drawn at another location, please call the office 24 hours in advance so we can fax the orders.  The office is located at [address] [place], [place], [place] [address] No appointment is necessary.    If you have any questions regarding directions or hours of operation,  please call [phone].   As a reminder, please drink plenty of water prior to coming for your lab work. Thanks!   Vaccines You are taking a medication(s) that can suppress your immune system.  The following immunizations are recommended: Flu annually Covid-19  RSV Td/Tdap (tetanus, diphtheria, pertussis) every 10 years Pneumonia (Prevnar 15 then Pneumovax 23 at least 1 year apart.  Alternatively, can take Prevnar 20 without needing additional dose) Shingrix: 2 doses from 4  weeks to 6 months apart  Please check with your PCP to make sure you are up to date.   If you have signs or symptoms of an infection or start antibiotics: First, call your PCP for workup of your infection. Hold your medication through the infection, until you complete your antibiotics, and until symptoms resolve if you take the following: Injectable medication (Actemra, Benlysta, Cimzia, Cosentyx, Enbrel, Humira, Kevzara, Orencia, Remicade, Simponi, Stelara, Taltz, Tremfya) Methotrexate Leflunomide (Arava) Mycophenolate (Cellcept) Morrie Sheldon, Olumiant, or Rinvoq

## 2022-08-28 ENCOUNTER — Ambulatory Visit (HOSPITAL_BASED_OUTPATIENT_CLINIC_OR_DEPARTMENT_OTHER)
Admission: RE | Admit: 2022-08-28 | Discharge: 2022-08-28 | Disposition: A | Discharge status: Home / Self Care | Payer: No Typology Code available for payment source | Source: Ambulatory Visit | Attending: Rheumatology | Admitting: Rheumatology

## 2022-08-28 ENCOUNTER — Other Ambulatory Visit: Payer: Self-pay | Admitting: *Deleted

## 2022-08-28 DIAGNOSIS — M316 Other giant cell arteritis: Secondary | ICD-10-CM

## 2022-08-28 DIAGNOSIS — Z13820 Encounter for screening for osteoporosis: Secondary | ICD-10-CM | POA: Diagnosis present

## 2022-08-28 DIAGNOSIS — Z7952 Long term (current) use of systemic steroids: Secondary | ICD-10-CM | POA: Diagnosis not present

## 2022-08-28 DIAGNOSIS — Z8262 Family history of osteoporosis: Secondary | ICD-10-CM | POA: Diagnosis not present

## 2022-08-28 DIAGNOSIS — Z79899 Other long term (current) drug therapy: Secondary | ICD-10-CM

## 2022-08-28 NOTE — Progress Notes (Signed)
DEXA scan is normal

## 2022-09-06 LAB — CBC WITH DIFFERENTIAL/PLATELET
Absolute Monocytes: 465 cells/uL (ref 200–950)
Basophils Absolute: 80 cells/uL (ref 0–200)
Basophils Relative: 1.7 %
Eosinophils Absolute: 291 cells/uL (ref 15–500)
Eosinophils Relative: 6.2 %
HCT: 41.4 % (ref 38.5–50.0)
Hemoglobin: 13.8 g/dL (ref 13.2–17.1)
Lymphs Abs: 1664 cells/uL (ref 850–3900)
MCH: 31.6 pg (ref 27.0–33.0)
MCHC: 33.3 g/dL (ref 32.0–36.0)
MCV: 94.7 fL (ref 80.0–100.0)
MPV: 12 fL (ref 7.5–12.5)
Monocytes Relative: 9.9 %
Neutro Abs: 2200 cells/uL (ref 1500–7800)
Neutrophils Relative %: 46.8 %
Platelets: 167 10*3/uL (ref 140–400)
RBC: 4.37 10*6/uL (ref 4.20–5.80)
RDW: 12.4 % (ref 11.0–15.0)
Total Lymphocyte: 35.4 %
WBC: 4.7 10*3/uL (ref 3.8–10.8)

## 2022-09-06 LAB — COMPLETE METABOLIC PANEL WITH GFR
AG Ratio: 2.2 (calc) (ref 1.0–2.5)
ALT: 55 U/L — ABNORMAL HIGH (ref 9–46)
AST: 37 U/L — ABNORMAL HIGH (ref 10–35)
Albumin: 4.4 g/dL (ref 3.6–5.1)
Alkaline phosphatase (APISO): 30 U/L — ABNORMAL LOW (ref 35–144)
BUN: 14 mg/dL (ref 7–25)
CO2: 27 mmol/L (ref 20–32)
Calcium: 9.5 mg/dL (ref 8.6–10.3)
Chloride: 104 mmol/L (ref 98–110)
Creat: 1.04 mg/dL (ref 0.70–1.30)
Globulin: 2 g/dL (calc) (ref 1.9–3.7)
Glucose, Bld: 98 mg/dL (ref 65–99)
Potassium: 4.2 mmol/L (ref 3.5–5.3)
Sodium: 140 mmol/L (ref 135–146)
Total Bilirubin: 0.9 mg/dL (ref 0.2–1.2)
Total Protein: 6.4 g/dL (ref 6.1–8.1)
eGFR: 83 mL/min/{1.73_m2} (ref >=60)

## 2022-09-06 LAB — SEDIMENTATION RATE: Sed Rate: 6 mm/h (ref 0–20)

## 2022-10-03 ENCOUNTER — Encounter: Payer: Self-pay | Admitting: *Deleted

## 2022-11-05 ENCOUNTER — Encounter: Payer: Self-pay | Admitting: *Deleted

## 2022-11-05 ENCOUNTER — Other Ambulatory Visit: Payer: Self-pay | Admitting: Physician Assistant

## 2022-11-05 DIAGNOSIS — Z79899 Other long term (current) drug therapy: Secondary | ICD-10-CM

## 2022-11-05 DIAGNOSIS — M316 Other giant cell arteritis: Secondary | ICD-10-CM

## 2022-11-05 NOTE — Telephone Encounter (Signed)
Last Fill: 07/21/2022  Labs: 09/05/2022 CBC WNL. Alk phos is borderline low-stable. AST and ALT are elevated.     TB Gold: 08/09/2021 Neg   Next Visit: 01/08/2023  Last Visit: 07/31/2022  OZ:HYQMVHQI arteritis   Current Dose per office note 07/31/2022:  Actemra 162 mg into the skin every 7 (seven) days   Sent message via my chart to advise patient he is due to update his TB Gold.   Okay to refill Actemra?

## 2022-11-06 ENCOUNTER — Other Ambulatory Visit: Payer: Self-pay | Admitting: *Deleted

## 2022-11-06 DIAGNOSIS — R7989 Other specified abnormal findings of blood chemistry: Secondary | ICD-10-CM

## 2022-11-06 DIAGNOSIS — Z1589 Genetic susceptibility to other disease: Secondary | ICD-10-CM

## 2022-11-06 DIAGNOSIS — Z79899 Other long term (current) drug therapy: Secondary | ICD-10-CM

## 2022-11-06 DIAGNOSIS — Z7952 Long term (current) use of systemic steroids: Secondary | ICD-10-CM

## 2022-11-06 DIAGNOSIS — R700 Elevated erythrocyte sedimentation rate: Secondary | ICD-10-CM

## 2022-11-06 DIAGNOSIS — M316 Other giant cell arteritis: Secondary | ICD-10-CM

## 2022-11-06 DIAGNOSIS — L409 Psoriasis, unspecified: Secondary | ICD-10-CM

## 2022-11-08 LAB — QUANTIFERON-TB GOLD PLUS
Mitogen-NIL: >10 IU/mL
NIL: 0.03 IU/mL
QuantiFERON-TB Gold Plus: NEGATIVE
TB1-NIL: 0 IU/mL
TB2-NIL: <0 IU/mL

## 2022-11-11 NOTE — Progress Notes (Signed)
TB Gold is negative.

## 2022-12-11 ENCOUNTER — Encounter: Payer: Self-pay | Admitting: Nurse Practitioner

## 2022-12-11 ENCOUNTER — Ambulatory Visit: Payer: No Typology Code available for payment source | Admitting: Nurse Practitioner

## 2022-12-11 DIAGNOSIS — E559 Vitamin D deficiency, unspecified: Secondary | ICD-10-CM | POA: Diagnosis not present

## 2022-12-11 DIAGNOSIS — N529 Male erectile dysfunction, unspecified: Secondary | ICD-10-CM

## 2022-12-11 DIAGNOSIS — J45909 Unspecified asthma, uncomplicated: Secondary | ICD-10-CM | POA: Diagnosis not present

## 2022-12-11 DIAGNOSIS — Z85828 Personal history of other malignant neoplasm of skin: Secondary | ICD-10-CM

## 2022-12-11 DIAGNOSIS — I10 Essential (primary) hypertension: Secondary | ICD-10-CM

## 2022-12-11 DIAGNOSIS — K2100 Gastro-esophageal reflux disease with esophagitis, without bleeding: Secondary | ICD-10-CM

## 2022-12-11 DIAGNOSIS — F3342 Major depressive disorder, recurrent, in full remission: Secondary | ICD-10-CM

## 2022-12-11 DIAGNOSIS — E785 Hyperlipidemia, unspecified: Secondary | ICD-10-CM | POA: Diagnosis not present

## 2022-12-11 DIAGNOSIS — R350 Frequency of micturition: Secondary | ICD-10-CM

## 2022-12-11 DIAGNOSIS — Z79899 Other long term (current) drug therapy: Secondary | ICD-10-CM

## 2022-12-11 DIAGNOSIS — E291 Testicular hypofunction: Secondary | ICD-10-CM | POA: Diagnosis not present

## 2022-12-11 DIAGNOSIS — E538 Deficiency of other specified B group vitamins: Secondary | ICD-10-CM

## 2022-12-11 DIAGNOSIS — R351 Nocturia: Secondary | ICD-10-CM

## 2022-12-11 DIAGNOSIS — L409 Psoriasis, unspecified: Secondary | ICD-10-CM

## 2022-12-11 LAB — CBC WITH DIFFERENTIAL/PLATELET
Basophils Absolute: 50 cells/uL (ref 0–200)
Eosinophils Absolute: 168 cells/uL (ref 15–500)
Eosinophils Relative: 4 %
Hemoglobin: 15.4 g/dL (ref 13.2–17.1)
MCV: 95.5 fL (ref 80.0–100.0)
RBC: 4.69 10*6/uL (ref 4.20–5.80)

## 2022-12-11 MED ORDER — TAMSULOSIN HCL 0.4 MG PO CAPS
ORAL_CAPSULE | ORAL | 3 refills | Status: DC
Start: 2022-12-11 — End: 2023-06-25

## 2022-12-11 MED ORDER — FAMOTIDINE 40 MG PO TABS
ORAL_TABLET | ORAL | 3 refills | Status: AC
Start: 2022-12-11 — End: ?

## 2022-12-11 MED ORDER — OMEPRAZOLE 40 MG PO CPDR
DELAYED_RELEASE_CAPSULE | ORAL | 3 refills | Status: AC
Start: 2022-12-11 — End: ?

## 2022-12-11 NOTE — Progress Notes (Addendum)
Follow Up  Assessment and Plan:  Primary hypertension Discussed DASH (Dietary Approaches to Stop Hypertension) DASH diet is lower in sodium than a typical American diet. Cut back on foods that are high in saturated fat, cholesterol, and trans fats. Eat more whole-grain foods, fish, poultry, and nuts Remain active and exercise as tolerated daily.  Monitor BP at home-Call if greater than 130/80.  Check CMP/CBC   Hyperlipidemia Discussed lifestyle modifications. Recommended diet heavy in fruits and veggies, omega 3's. Decrease consumption of animal meats, cheeses, and dairy products. Remain active and exercise as tolerated. Continue to monitor. Check lipids/TSH  Hypogonadism male Hypogonadism- continue replacement therapy, check testosterone levels as needed.   Asthma, mild intermittent, uncomplicated Controlled Continue inhaler as needed.  Depression, remission Off of meds, doing well, continues counseling for maintenance  Gastroesophageal reflux disease with esophagitis No suspected reflux complications (Barret/stricture). Lifestyle modification:  wt loss, avoid meals 2-3h before bedtime. Consider eliminating food triggers:  chocolate, caffeine, EtOH, acid/spicy food.  Erectile dysfunction, unspecified erectile dysfunction type Continue Levitra Discussed changing to a different medication if no longer effective.   Vitamin D deficiency Continue supplement Monitor levels  History of basal cell carcinoma Follows Derm, reminded to schedule  Psoriasis Follows derm annually and rheum PRN Continue Actemra  Palpitations EKG - PAC's NSR Stay well hydrated Limit caffeine, alcohol  Discussed holter monitor if s/s fail to improve  Nocturia/Frequency of micturatino  Start Tamsulosin 0.4 mg Monitor PSA - WNL during CPE Continue to monitor  Orders Placed This Encounter  Procedures   CBC with Differential/Platelet   COMPLETE METABOLIC PANEL WITH GFR   Lipid panel    VITAMIN D 25 Hydroxy (Vit-D Deficiency, Fractures)    Notify office for further evaluation and treatment, questions or concerns if any reported s/s fail to improve.   The patient was advised to call back or seek an in-person evaluation if any symptoms worsen or if the condition fails to improve as anticipated.   Further disposition pending results of labs. Discussed med's effects and SE's.    I discussed the assessment and treatment plan with the patient. The patient was provided an opportunity to ask questions and all were answered. The patient agreed with the plan and demonstrated an understanding of the instructions.  Discussed med's effects and SE's. Screening labs and tests as requested with regular follow-up as recommended.  I provided 35 minutes of face-to-face time during this encounter including counseling, chart review, and critical decision making was preformed.  Today's Plan of Care is based on a patient-centered health care approach known as shared decision making - the decisions, tests and treatments allow for patient preferences and values to be balanced with clinical evidence.    Future Appointments  Date Time Provider Department Center  01/08/2023  2:40 PM Pollyann Savoy, MD [facility] None  06/15/2023 10:00 AM Adela Glimpse, NP [facility] None    HPI 62 y.o. male presents for a general follow up. He has Plantar fasciitis; Allergy; Asthma; Depression; GERD (gastroesophageal reflux disease); ED (erectile dysfunction); Hypertension; Hyperlipidemia; Testicular hypofunction; DJD of shoulder; History of basal cell carcinoma; Psoriasis; Medication management; Vitamin D deficiency; B12 deficiency; HLA B27 (HLA B27 positive); Temporal arteritis (HCC); Squamous cell carcinoma of right thigh; and Mild concentric left ventricular hypertrophy (LVH) on their problem list.   Married, no children, 2 dogs. He is a Education officer, community.  Questioning retirement.   Has noticed increased in palpitations  over the last several months.  Possibly related to less hydration, or SSE  to Actemra.  Does drink occasionally more than two cups a coffee daily.  No other associated s/s including SOB, chest pain.  He has noticed increase in nocturia.  Increased to 3-4 times nightly.  Associated frequency throughout the day.  He has history of HLAB27+, joint pain, has psoriasis and possible psoriatic arthritis, He follows intermittently with Dr. Titus Dubin, at this time he is only doing topical treatment and mobic PRN, no DMARD.  Feels well controlled.  Hx of depression in remission off of meds, continues with counseling with Vernell Leep and finds this beneficial. Uses xanax rarely as needed. Denies extreme mood swings.   He has allergies, well controlled on current regimen.  Hx of GERD, known hiatal hernia per patient,  on omeprazole daily PM and famotidine PRN.  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 He continues to follow as scheduled.  BMI is Body mass index is 25.64 kg/m., he has been working on diet and exercise. Wt Readings from Last 3 Encounters:  12/11/22 186 lb 6.4 oz (84.6 kg)  07/31/22 186 lb (84.4 kg)  07/10/22 188 lb 9.6 oz (85.5 kg)    His blood pressure has been controlled at home (110s-120s/60s), he is taking 20 mg olmesartan, today their BP is BP: 122/84.  He does workout. He denies chest pain, shortness of breath, dizziness.  He had negative at home screening sleep study with dentist.  He is on cholesterol medication, atorvastatin 10 mg daily and denies myalgias. His cholesterol is at goal. The cholesterol last visit was:   Lab Results  Component Value Date   CHOL 170 05/30/2022   HDL 59 05/30/2022   LDLCALC 92 05/30/2022   TRIG 91 05/30/2022   CHOLHDL 2.9 05/30/2022    Last A1C in the office was:  Lab Results  Component Value Date   HGBA1C 5.4 05/30/2022   He has a history of testosterone  deficiency and is on testosterone replacement (natesto topical- he reports does forget to take occasionally). He states that the testosterone helps with his energy, libido, muscle mass. He has ED, back on levitra 20 mg which seemed to work better for him. Interested in changing medications d/t cost.  Lab Results  Component Value Date   TESTOSTERONE 668 05/30/2022    He does have ongoing dribbling (life long) and the stream has gotten mildly slower, denies sense of incomplete emptying.  Lab Results  Component Value Date   PSA 1.05 05/30/2022   PSA 0.77 05/24/2021   PSA 0.88 05/18/2020   Patient is on Vitamin D supplement.   Lab Results  Component Value Date   VD25OH 33 05/30/2022   He admits not regularly taking supplement -  Lab Results  Component Value Date   VITAMINB12 334 05/30/2022     Current Medications:  Current Outpatient Medications on File Prior to Visit  Medication Sig Dispense Refill   ACTEMRA ACTPEN 162 MG/0.9ML SOAJ INJECT 162MG  SUBCUTANEOUSLY  EVERY WEEK 3.6 mL 0   albuterol (VENTOLIN HFA) 108 (90 Base) MCG/ACT inhaler Use 2 inhalations    15 minutes apart     every 4 hours     to rescue Asthma Attack 48 g 3   atorvastatin (LIPITOR) 10 MG tablet TAKE 1 TABLET ONCE DAILY FOR CHOLESTEROL. 90 tablet 3   azelastine (ASTELIN) 0.1 % nasal spray Place 1 spray into both nostrils 2 (two) times daily.     diclofenac  sodium (VOLTAREN) 1 % GEL 3 g to 3 large joints three times daily (Patient taking differently: as needed. 3 g to 3 large joints three times daily) 3 Tube 3   levocetirizine (XYZAL) 5 MG tablet Take 5 mg by mouth as needed.     MAGNESIUM PO Take by mouth daily.     NATESTO 5.5 MG/ACT GEL use 1 pump in each nostril three times daily *wash hands after use 21.96 g 5   olmesartan (BENICAR) 40 MG tablet Take 1 tablet (40 mg total) by mouth daily. 30 tablet 11   Polyethylene Glycol 3350 (MIRALAX PO) Take by mouth.     triamcinolone cream (KENALOG) 0.1 % Apply 1  application topically 2 (two) times daily. 45 g 1   vardenafil (LEVITRA) 20 MG tablet Take 1 tablet Daily if Needed for XXXX                                            /                    TAKE ONE TABLET BY MOUTH 30 tablet 1   benzonatate (TESSALON) 200 MG capsule Take 1 perle 3 x / day to prevent cough (Patient not taking: Reported on 12/11/2022) 30 capsule 1   dexamethasone (DECADRON) 4 MG tablet Take 1 tab 3 x day - 3 days, then 2 x day - 3 days, then 1 tab daily 20 tablet 0   No current facility-administered medications on file prior to visit.   Health Maintenance:  Immunization History  Administered Date(s) Administered   Influenza Inj Mdck Quad Pf 04/24/2017   Influenza Inj Mdck Quad With Preservative 02/26/2018, 05/04/2020   Influenza Split 03/28/2013, 03/30/2014   Influenza, Seasonal, Injecte, Preservative Fre 05/04/2015   Influenza,inj,Quad PF,6+ Mos 05/30/2022   PFIZER Comirnaty(Gray Top)Covid-19 Tri-Sucrose Vaccine 03/21/2021   PFIZER(Purple Top)SARS-COV-2 Vaccination 06/24/2019, 07/15/2019, 06/10/2020   Tdap 02/20/2017   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   Temporal arteritis (HCC) 07/11/2021   HLA B27 (HLA B27 positive) 11/16/2020   B12 deficiency 05/19/2020   Medication management 05/04/2019   Vitamin D deficiency 05/04/2019   History of basal cell carcinoma 02/09/2015   Psoriasis 02/09/2015   Testicular hypofunction 08/18/2014   DJD of shoulder 08/18/2014   Hypertension    Hyperlipidemia    Allergy    Asthma    Depression    GERD (gastroesophageal reflux disease)    ED (erectile dysfunction)    Plantar fasciitis 01/06/2012   Allergies Allergies  Allergen Reactions   Enalapril Cough    SURGICAL HISTORY He  has a past surgical history that includes Vasectomy (2013); orthopedic surgery (Left); Hernia repair  (Bilateral, 1970); LASIK; scalp biopsy; and Ptosis repair (Bilateral, 11/2020). FAMILY HISTORY His family history includes Ankylosing spondylitis in his brother; Aortic stenosis in his father; Asthma in his father; Cancer in his paternal grandmother; Heart attack in his paternal grandfather; Heart disease in his brother; Kidney disease in his father; Rheum arthritis in his father; Uterine cancer in his mother. SOCIAL HISTORY He  reports that he has quit smoking. His smoking use included cigars. He has never been exposed to tobacco smoke. He has never used smokeless tobacco. He reports current  alcohol use of about 14.0 standard drinks of alcohol per week. He reports that he does not use drugs.   Review of Systems  Constitutional:  Negative for malaise/fatigue and weight loss.  HENT:  Negative for congestion, hearing loss, sore throat and tinnitus.   Eyes:  Negative for blurred vision and double vision.  Respiratory:  Negative for cough, shortness of breath and wheezing.   Cardiovascular:  Negative for chest pain, palpitations, orthopnea, claudication and leg swelling.  Gastrointestinal:  Negative for abdominal pain, blood in stool, constipation, diarrhea, heartburn, melena, nausea and vomiting.  Genitourinary: Negative.   Musculoskeletal:  Negative for joint pain (intermittent R hip, working with massage) and myalgias.  Skin:  Negative for rash.  Neurological:  Negative for dizziness, tingling, sensory change, weakness and headaches.  Endo/Heme/Allergies:  Negative for polydipsia.  Psychiatric/Behavioral:  Negative for depression, memory loss and suicidal ideas. The patient is nervous/anxious (intermittent). The patient does not have insomnia.   All other systems reviewed and are negative.   Physical Exam: Estimated body mass index is 25.64 kg/m as calculated from the following:   Height as of this encounter: 5' 11.5" (1.816 m).   Weight as of this encounter: 186 lb 6.4 oz (84.6 kg). BP  122/84   Pulse 75   Temp 97.8 F (36.6 C)   Ht 5' 11.5" (1.816 m)   Wt 186 lb 6.4 oz (84.6 kg)   SpO2 98%   BMI 25.64 kg/m  General Appearance: Well nourished, in no apparent distress. Eyes: PERRLA, EOMs, conjunctiva no swelling or erythema. Mild left lid ptosis.  Sinuses: No Frontal/maxillary tenderness ENT/Mouth: Ext aud canals clear, normal light reflex with TMs without erythema, bulging. Good dentition. No erythema, swelling, or exudate on post pharynx. Tonsils not swollen or erythematous. Hearing normal.  Neck: Supple, thyroid normal. No bruits Respiratory: Respiratory effort normal, BS equal bilaterally without rales, rhonchi, wheezing or stridor. Cardio: RRR without murmurs, rubs or gallops. Brisk peripheral pulses without edema.  Chest: symmetric, with normal excursions and percussion. Abdomen: Soft, +BS. Non tender, no guarding, rebound, hernias, masses, or organomegaly.  Lymphatics: Non tender without lymphadenopathy.  Genitourinary: defer Musculoskeletal: Full ROM all peripheral extremities,5/5 strength, and normal gait. Mildly tender R medial epicondyle without erythema or swelling. Wrist full ROM without crepitus, bony enlargement, neg phalen's, intact strength and distal sensation Skin: Warm, dry without rashes, lesions, ecchymosis.  Neuro: Cranial nerves intact, reflexes equal bilaterally. Normal muscle tone, no cerebellar symptoms. Sensation intact.  Psych: Awake and oriented X 3, normal affect, Insight and Judgment appropriate.   EKG:  NSR PAC  Stephen Campbell 1:59 PM

## 2022-12-11 NOTE — Patient Instructions (Signed)

## 2022-12-12 LAB — COMPLETE METABOLIC PANEL WITH GFR
AG Ratio: 2.3 (calc) (ref 1.0–2.5)
ALT: 101 U/L — ABNORMAL HIGH (ref 9–46)
AST: 74 U/L — ABNORMAL HIGH (ref 10–35)
Albumin: 5.2 g/dL — ABNORMAL HIGH (ref 3.6–5.1)
Alkaline phosphatase (APISO): 37 U/L (ref 35–144)
BUN: 18 mg/dL (ref 7–25)
CO2: 26 mmol/L (ref 20–32)
Calcium: 10.4 mg/dL — ABNORMAL HIGH (ref 8.6–10.3)
Chloride: 102 mmol/L (ref 98–110)
Creat: 1.06 mg/dL (ref 0.70–1.30)
Globulin: 2.3 g/dL (calc) (ref 1.9–3.7)
Glucose, Bld: 104 mg/dL — ABNORMAL HIGH (ref 65–99)
Potassium: 4.6 mmol/L (ref 3.5–5.3)
Sodium: 140 mmol/L (ref 135–146)
Total Bilirubin: 1.9 mg/dL — ABNORMAL HIGH (ref 0.2–1.2)
Total Protein: 7.5 g/dL (ref 6.1–8.1)
eGFR: 81 mL/min/{1.73_m2} (ref >=60)

## 2022-12-12 LAB — LIPID PANEL
Cholesterol: 203 mg/dL — ABNORMAL HIGH (ref <200)
HDL: 57 mg/dL (ref >=40)
LDL Cholesterol (Calc): 118 mg/dL (calc) — ABNORMAL HIGH
Non-HDL Cholesterol (Calc): 146 mg/dL (calc) — ABNORMAL HIGH (ref <130)
Total CHOL/HDL Ratio: 3.6 (calc) (ref <5.0)
Triglycerides: 160 mg/dL — ABNORMAL HIGH (ref <150)

## 2022-12-12 LAB — CBC WITH DIFFERENTIAL/PLATELET
Absolute Monocytes: 521 cells/uL (ref 200–950)
Basophils Relative: 1.2 %
HCT: 44.8 % (ref 38.5–50.0)
Lymphs Abs: 1302 cells/uL (ref 850–3900)
MCH: 32.8 pg (ref 27.0–33.0)
MCHC: 34.4 g/dL (ref 32.0–36.0)
MPV: 11.8 fL (ref 7.5–12.5)
Monocytes Relative: 12.4 %
Neutro Abs: 2159 cells/uL (ref 1500–7800)
Neutrophils Relative %: 51.4 %
Platelets: 167 10*3/uL (ref 140–400)
RDW: 12 % (ref 11.0–15.0)
Total Lymphocyte: 31 %
WBC: 4.2 10*3/uL (ref 3.8–10.8)

## 2022-12-12 LAB — VITAMIN D 25 HYDROXY (VIT D DEFICIENCY, FRACTURES): Vit D, 25-Hydroxy: 42 ng/mL (ref 30–100)

## 2022-12-15 NOTE — Addendum Note (Signed)
Addended by: Adela Glimpse on: 12/15/2022 01:49 PM   Modules accepted: Orders

## 2022-12-25 NOTE — Progress Notes (Signed)
Office Visit Note  Patient: Stephen Campbell             Date of Birth: 1961-05-04           MRN: [id number]             PCP: Lucky Cowboy, MD Referring: Lucky Cowboy, MD Visit Date: 01/08/2023 Occupation: @GUAROCC @  Subjective:  Paresthesias in bilateral hands  History of Present Illness: Stephen Campbell is a 62 y.o. male with temporal arteritis, osteoarthritis and psoriasis.  He continues to be on Actemra current 162 mg subcutaneous every 7 days since December 2023.  He had been off steroids for a while.  He had no recurrence of temporal arteritis.  He gets occasional psoriasis lesions which respond to topical agents.  He has mild carpal tunnel syndrome based on the nerve conduction velocities done in 2023 which causes paresthesias intermittently.  He also has underlying osteoarthritis which causes stiffness.  He continues to have some discomfort in his lower back.  He states has been using muscle relaxer and meloxicam intermittently.  In June prior to having his labs he was taking Tylenol.  He had recent labs with his PCP which also showed elevated LFTs.  He plans to have repeat LFTs in 6 weeks.  He reports drinking 2 glasses of wine at night. He has cut back on the alcohol intake since his labs.  He states he has been purulence increase oral ulcers.    Activities of Daily Living:  Patient reports morning stiffness for a few minutes.   Patient Reports nocturnal pain.  Difficulty dressing/grooming: Denies Difficulty climbing stairs: Denies Difficulty getting out of chair: Denies Difficulty using hands for taps, buttons, cutlery, and/or writing: Reports  Review of Systems  Constitutional:  Negative for fatigue.  HENT:  Positive for mouth sores. Negative for mouth dryness.   Eyes:  Negative for dryness.  Respiratory:  Negative for shortness of breath.   Cardiovascular:  Negative for chest pain and palpitations.  Gastrointestinal:  Positive for constipation. Negative for blood in stool  and diarrhea.  Endocrine: Negative for increased urination.  Genitourinary:  Negative for involuntary urination.  Musculoskeletal:  Positive for joint pain, joint pain, myalgias, morning stiffness and myalgias. Negative for gait problem, joint swelling, muscle weakness and muscle tenderness.  Skin:  Positive for rash. Negative for color change, hair loss and sensitivity to sunlight.  Allergic/Immunologic: Negative for susceptible to infections.  Neurological:  Positive for numbness and parasthesias. Negative for dizziness and headaches.  Hematological:  Negative for swollen glands.  Psychiatric/Behavioral:  Negative for depressed mood and sleep disturbance. The patient is not nervous/anxious.     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   Temporal arteritis (HCC) 07/11/2021   HLA B27 (HLA B27 positive) 11/16/2020   B12 deficiency 05/19/2020   Medication management 05/04/2019   Vitamin D deficiency 05/04/2019   History of basal cell carcinoma 02/09/2015   Psoriasis 02/09/2015   Testicular hypofunction 08/18/2014   DJD of shoulder 08/18/2014   Hypertension    Hyperlipidemia    Allergy    Asthma    Depression    GERD (gastroesophageal reflux disease)    ED (erectile dysfunction)    Plantar fasciitis 01/06/2012    Past Medical History:  Diagnosis Date   Allergy    Anxiety    Asthma    Basal cell  carcinoma    Depression    ED (erectile dysfunction)    GERD (gastroesophageal reflux disease)    Hyperlipidemia    Hypertension    Hypogonadism in male    Osteoarthritis    Temporal arteritis (HCC)    Vitamin D deficiency     Family History  Problem Relation Age of Onset   Uterine cancer Mother    Asthma Father    Kidney disease Father    Rheum arthritis Father    Aortic stenosis Father        valve replacement   Ankylosing  spondylitis Brother    Heart disease Brother    Cancer Paternal Grandmother    Heart attack Paternal Grandfather    Colon cancer Neg Hx    Colon polyps Neg Hx    Gallbladder disease Neg Hx    Esophageal cancer Neg Hx    Diabetes Neg Hx    Past Surgical History:  Procedure Laterality Date   HERNIA REPAIR Bilateral 1970   62 yrs old, inguinal    LASIK     ORTHOPEDIC SURGERY Left    left shoulder, bone spur, Dr. Samul Dada   PTOSIS REPAIR Bilateral 11/2020   Dr. Toni Arthurs - [facility]   scalp biopsy     basal cell    VASECTOMY  2013   Social History   Social History Narrative   Not on file   Immunization History  Administered Date(s) Administered   Influenza Inj Mdck Quad Pf 04/24/2017   Influenza Inj Mdck Quad With Preservative 02/26/2018, 05/04/2020   Influenza Split 03/28/2013, 03/30/2014   Influenza, Seasonal, Injecte, Preservative Fre 05/04/2015   Influenza,inj,Quad PF,6+ Mos 05/30/2022   PFIZER Comirnaty(Gray Top)Covid-19 Tri-Sucrose Vaccine 03/21/2021   PFIZER(Purple Top)SARS-COV-2 Vaccination 06/24/2019, 07/15/2019, 06/10/2020   Tdap 02/20/2017     Objective: Vital Signs: BP 127/82 (BP Location: Left Arm, Patient Position: Sitting, Cuff Size: Normal)   Pulse 79   Resp 16   Ht 6' (1.829 m)   Wt 190 lb (86.2 kg)   BMI 25.77 kg/m    Physical Exam Vitals and nursing note reviewed.  Constitutional:      Appearance: He is well-developed.  HENT:     Head: Normocephalic and atraumatic.  Eyes:     Conjunctiva/sclera: Conjunctivae normal.     Pupils: Pupils are equal, round, and reactive to light.  Cardiovascular:     Rate and Rhythm: Normal rate and regular rhythm.     Heart sounds: Normal heart sounds.  Pulmonary:     Effort: Pulmonary effort is normal.     Breath sounds: Normal breath sounds.  Abdominal:     General: Bowel sounds are normal.     Palpations: Abdomen is soft.  Musculoskeletal:     Cervical back: Normal range of motion and neck supple.   Skin:    General: Skin is warm and dry.     Capillary Refill: Capillary refill takes less than 2 seconds.  Neurological:     Mental Status: He is alert and oriented to person, place, and time.  Psychiatric:        Behavior: Behavior normal.      Musculoskeletal Exam: He had good range of motion of the cervical spine with discomfort.  Thoracic and lumbar spine were in good range of motion.  Shoulder joints, elbow joints, wrist joints, MCPs PIPs and DIPs were in good range of motion with no synovitis.  Hip joints and knee joints in good range of motion.  No warmth swelling or effusion was noted.  There was no tenderness over ankles or MTPs.  CDAI Exam: CDAI Score: -- Patient Global: --; Provider Global: -- Swollen: --; Tender: -- Joint Exam 01/08/2023   No joint exam has been documented for this visit   There is currently no information documented on the homunculus. Go to the Rheumatology activity and complete the homunculus joint exam.  Investigation: No additional findings.  Imaging: No results found.  Recent Labs: Lab Results  Component Value Date   WBC 4.2 12/11/2022   HGB 15.4 12/11/2022   PLT 167 12/11/2022   NA 140 12/11/2022   K 4.6 12/11/2022   CL 102 12/11/2022   CO2 26 12/11/2022   GLUCOSE 104 (H) 12/11/2022   BUN 18 12/11/2022   CREATININE 1.06 12/11/2022   BILITOT 1.9 (H) 12/11/2022   ALKPHOS 39 (L) 08/15/2016   AST 74 (H) 12/11/2022   ALT 101 (H) 12/11/2022   PROT 7.5 12/11/2022   ALBUMIN 4.4 08/15/2016   CALCIUM 10.4 (H) 12/11/2022   GFRAA 101 11/16/2020   QFTBGOLDPLUS NEGATIVE 11/06/2022    Speciality Comments: Actemra started 08/15/21  Procedures:  No procedures performed Allergies: Enalapril   Assessment / Plan:     Visit Diagnoses: Temporal arteritis (HCC)-he has had no recurrence of temporal headaches.  He has been off prednisone since October 2023.  He continues to take Actemra 162 mg subcu every 7 days.  He denies any side effects from  Actemra.  He states he has been having frequent oral ulcers he is uncertain if they are related to Actemra.  He has had history of canker sores for many years.  High risk medication use-Actemra 162 mg subcu every 7 days.  He has been off prednisone since April 03, 2022.  CBC and CMP obtained on December 11, 2022 were remarkable for elevated LFTs AST 74 and ALT 101.  Calcium was elevated 10.4.  He was advised to get labs every 3 months.  He will follow-up with his PCP regarding elevated LFTs.  He may need GI evaluation if the LFTs stay elevated.  Information guarding vaccination was placed in the obese.  He was advised to hold Actemra if he develops an infection resume after the infection resolves.  Elevated LFTs-patient states he drinks 2 glasses of wine at night and also has been taking meloxicam for lower back pain.  He had recent labs done by his PCP.  He plans to repeat labs after cutting back on NSAIDs and alcohol intake.  He is also on statins.  Psoriasis-he gets occasional lesions of psoriasis for which she uses topical agents.  HLA B27 positive  Long term (current) use of systemic steroids-has been off steroids now.  Primary osteoarthritis of both hands-he had no synovitis on the examination.  Mild PIP and DIP thickening was noted.  Paresthesia of both hands-he states he continues to have paresthesias in his bilateral hands.  Previous nerve conduction velocity showed mild carpal tunnel syndrome.  He has neck is stiffness and is concerned if the paresthesias are coming from his cervical spine.  I advised him to discuss with the neurosurgeon.  Trochanteric bursitis of both hips-intermittent pain..  Chondromalacia patellae, right knee-currently asymptomatic.  Primary osteoarthritis of both feet-he is not having any discomfort today.  Plantar fasciitis-no recent flare.  Has been using proper fitting shoes.  Bilateral calcaneal spurs  DDD (degenerative disc disease), cervical-he is  moderate to spinal Canal stenosis and degenerative disc disease.  He has  been followed by neurosurgery.  He is going to physical therapy.  He states his neck pain is getting worse.  His symptoms get worse when he is working.  He is also noticed decreased grip strength.  Need for regular exercise was emphasized.  DDD (degenerative disc disease), lumbar-he has chronic lower back pain.  The back pain is also exacerbated by his work.  He has been taking meloxicam on as needed basis for lower back pain.  History of hypertension-blood pressure was normal at 127/82 today.  Other medical problems listed as follows:  Vitamin D deficiency  History of gastroesophageal reflux (GERD)  History of hypercholesterolemia  History of asthma  History of basal cell carcinoma  History of anxiety  Orders: No orders of the defined types were placed in this encounter.  No orders of the defined types were placed in this encounter.    Follow-Up Instructions: Return in about 5 months (around 06/10/2023) for TA.   Pollyann Savoy, MD  Note - This record has been created using Animal nutritionist.  Chart creation errors have been sought, but may not always  have been located. Such creation errors do not reflect on  the standard of medical care.

## 2022-12-28 ENCOUNTER — Other Ambulatory Visit: Payer: Self-pay | Admitting: Rheumatology

## 2022-12-28 DIAGNOSIS — Z79899 Other long term (current) drug therapy: Secondary | ICD-10-CM

## 2022-12-28 DIAGNOSIS — M316 Other giant cell arteritis: Secondary | ICD-10-CM

## 2022-12-29 NOTE — Telephone Encounter (Signed)
Last Fill: 11/05/2022 (30 day supply)  Labs: 12/11/2022 Glucose 104  TB Gold: 11/06/2022 Neg, Calcium 10.4, Albumin 5.2, Total Bilirubin 1.9, AST 74, ALT 101  Next Visit: 01/08/2023  Last Visit: 07/31/2022  RU:[id number] arteritis   Current Dose per office note 07/31/2022: Actemra 162 mg into the skin every 7 (seven) days.   Okay to refill Actemra?

## 2023-01-05 ENCOUNTER — Encounter: Payer: Self-pay | Admitting: Nurse Practitioner

## 2023-01-05 ENCOUNTER — Other Ambulatory Visit: Payer: Self-pay | Admitting: Internal Medicine

## 2023-01-05 DIAGNOSIS — M545 Low back pain, unspecified: Secondary | ICD-10-CM

## 2023-01-05 MED ORDER — MELOXICAM 15 MG PO TABS
ORAL_TABLET | ORAL | 3 refills | Status: DC
Start: 2023-01-05 — End: 2023-06-25

## 2023-01-08 ENCOUNTER — Encounter: Payer: Self-pay | Admitting: Rheumatology

## 2023-01-08 ENCOUNTER — Ambulatory Visit: Payer: No Typology Code available for payment source | Attending: Rheumatology | Admitting: Rheumatology

## 2023-01-08 VITALS — BP 127/82 | HR 79 | Resp 16 | Ht 72.0 in | Wt 190.0 lb

## 2023-01-08 DIAGNOSIS — R202 Paresthesia of skin: Secondary | ICD-10-CM

## 2023-01-08 DIAGNOSIS — M19042 Primary osteoarthritis, left hand: Secondary | ICD-10-CM

## 2023-01-08 DIAGNOSIS — M722 Plantar fascial fibromatosis: Secondary | ICD-10-CM

## 2023-01-08 DIAGNOSIS — Z1589 Genetic susceptibility to other disease: Secondary | ICD-10-CM

## 2023-01-08 DIAGNOSIS — Z8639 Personal history of other endocrine, nutritional and metabolic disease: Secondary | ICD-10-CM

## 2023-01-08 DIAGNOSIS — M316 Other giant cell arteritis: Secondary | ICD-10-CM | POA: Diagnosis not present

## 2023-01-08 DIAGNOSIS — M51369 Other intervertebral disc degeneration, lumbar region without mention of lumbar back pain or lower extremity pain: Secondary | ICD-10-CM

## 2023-01-08 DIAGNOSIS — L409 Psoriasis, unspecified: Secondary | ICD-10-CM

## 2023-01-08 DIAGNOSIS — M7731 Calcaneal spur, right foot: Secondary | ICD-10-CM

## 2023-01-08 DIAGNOSIS — M5136 Other intervertebral disc degeneration, lumbar region: Secondary | ICD-10-CM

## 2023-01-08 DIAGNOSIS — M5030 Other cervical disc degeneration, unspecified cervical region: Secondary | ICD-10-CM

## 2023-01-08 DIAGNOSIS — Z79899 Other long term (current) drug therapy: Secondary | ICD-10-CM | POA: Diagnosis not present

## 2023-01-08 DIAGNOSIS — M19072 Primary osteoarthritis, left ankle and foot: Secondary | ICD-10-CM

## 2023-01-08 DIAGNOSIS — Z8679 Personal history of other diseases of the circulatory system: Secondary | ICD-10-CM

## 2023-01-08 DIAGNOSIS — Z7952 Long term (current) use of systemic steroids: Secondary | ICD-10-CM

## 2023-01-08 DIAGNOSIS — M2241 Chondromalacia patellae, right knee: Secondary | ICD-10-CM

## 2023-01-08 DIAGNOSIS — M19041 Primary osteoarthritis, right hand: Secondary | ICD-10-CM

## 2023-01-08 DIAGNOSIS — Z8659 Personal history of other mental and behavioral disorders: Secondary | ICD-10-CM

## 2023-01-08 DIAGNOSIS — M7732 Calcaneal spur, left foot: Secondary | ICD-10-CM

## 2023-01-08 DIAGNOSIS — Z8709 Personal history of other diseases of the respiratory system: Secondary | ICD-10-CM

## 2023-01-08 DIAGNOSIS — M19071 Primary osteoarthritis, right ankle and foot: Secondary | ICD-10-CM

## 2023-01-08 DIAGNOSIS — E559 Vitamin D deficiency, unspecified: Secondary | ICD-10-CM

## 2023-01-08 DIAGNOSIS — M7062 Trochanteric bursitis, left hip: Secondary | ICD-10-CM

## 2023-01-08 DIAGNOSIS — R7989 Other specified abnormal findings of blood chemistry: Secondary | ICD-10-CM

## 2023-01-08 DIAGNOSIS — M7061 Trochanteric bursitis, right hip: Secondary | ICD-10-CM

## 2023-01-08 DIAGNOSIS — Z85828 Personal history of other malignant neoplasm of skin: Secondary | ICD-10-CM

## 2023-01-08 DIAGNOSIS — Z8719 Personal history of other diseases of the digestive system: Secondary | ICD-10-CM

## 2023-01-08 NOTE — Patient Instructions (Signed)
Standing Labs We placed an order today for your standing lab work.   Please have your standing labs drawn (CBC with differential and CMP with GFR) in September and every 3 months  Please have your labs drawn 2 weeks prior to your appointment so that the provider can discuss your lab results at your appointment, if possible.  Please note that you may see your imaging and lab results in MyChart before we have reviewed them. We will contact you once all results are reviewed. Please allow our office up to 72 hours to thoroughly review all of the results before contacting the office for clarification of your results.  WALK-IN LAB HOURS  Monday through Thursday from 8:00 am -12:30 pm and 1:00 pm-5:00 pm and Friday from 8:00 am-12:00 pm.  Patients with office visits requiring labs will be seen before walk-in labs.  You may encounter longer than normal wait times. Please allow additional time. Wait times may be shorter on  Monday and Thursday afternoons.  We do not book appointments for walk-in labs. We appreciate your patience and understanding with our staff.   Labs are drawn by [facility]. Please bring your co-pay at the time of your lab draw.  You may receive a bill from [facility] for your lab work.  Please note if you are on Hydroxychloroquine and and an order has been placed for a Hydroxychloroquine level,  you will need to have it drawn 4 hours or more after your last dose.  If you wish to have your labs drawn at another location, please call the office 24 hours in advance so we can fax the orders.  The office is located at [address], [place], [place] [address]   If you have any questions regarding directions or hours of operation,  please call [phone].   As a reminder, please drink plenty of water prior to coming for your lab work. Thanks!   Vaccines You are taking a medication(s) that can suppress your immune system.  The following immunizations are recommended: Flu  annually Covid-19  RSV Td/Tdap (tetanus, diphtheria, pertussis) every 10 years Pneumonia (Prevnar 15 then Pneumovax 23 at least 1 year apart.  Alternatively, can take Prevnar 20 without needing additional dose) Shingrix: 2 doses from 4 weeks to 6 months apart  If you have signs or symptoms of an infection or start antibiotics: First, call your PCP for workup of your infection. Hold your medication through the infection, until you complete your antibiotics, and until symptoms resolve if you take the following: Injectable medication (Actemra, Benlysta, Cimzia, Cosentyx, Enbrel, Humira, Kevzara, Orencia, Remicade, Simponi, Stelara, Taltz, Tremfya) Methotrexate Leflunomide (Arava) Mycophenolate (Cellcept) Osborne [place], or Rinvoq   Please check with your PCP to make sure you are up to date.

## 2023-01-30 ENCOUNTER — Telehealth: Payer: Self-pay | Admitting: Pharmacist

## 2023-01-30 NOTE — Telephone Encounter (Signed)
Received notification from [facility] regarding a prior authorization for ACTEMRA. Authorization has been APPROVED from 01/30/23 to 01/30/24. Approval letter sent to scan center.  Patient must continue to fill through [facility]: [phone]   Authorization #  [id number]  Chesley Mires, PharmD, MPH, BCPS, CPP Clinical Pharmacist (Rheumatology and Pulmonology)

## 2023-01-30 NOTE — Telephone Encounter (Signed)
Submitted a Prior Authorization renewal request to [facility] for ACTEMRA via CoverMyMeds. Will update once we receive a response.  Key: [id number]

## 2023-03-16 ENCOUNTER — Telehealth: Payer: Self-pay | Admitting: *Deleted

## 2023-03-16 NOTE — Telephone Encounter (Signed)
Left message to advise patient we received fax from [facility] stating they have been trying to reach patient to refill Actemra. They have been unsuccessful in reaching patient. Call back number is [phone]. Patient advised to contact the pharmacy to set up shipment.

## 2023-04-05 ENCOUNTER — Other Ambulatory Visit: Payer: Self-pay | Admitting: Physician Assistant

## 2023-04-05 DIAGNOSIS — Z79899 Other long term (current) drug therapy: Secondary | ICD-10-CM

## 2023-04-05 DIAGNOSIS — M316 Other giant cell arteritis: Secondary | ICD-10-CM

## 2023-04-08 ENCOUNTER — Encounter: Payer: Self-pay | Admitting: Nurse Practitioner

## 2023-04-08 ENCOUNTER — Other Ambulatory Visit: Payer: Self-pay | Admitting: Internal Medicine

## 2023-04-08 DIAGNOSIS — I10 Essential (primary) hypertension: Secondary | ICD-10-CM

## 2023-04-21 ENCOUNTER — Encounter: Payer: Self-pay | Admitting: Rheumatology

## 2023-04-21 ENCOUNTER — Other Ambulatory Visit: Payer: Self-pay | Admitting: Physician Assistant

## 2023-04-21 DIAGNOSIS — M316 Other giant cell arteritis: Secondary | ICD-10-CM

## 2023-04-21 DIAGNOSIS — Z79899 Other long term (current) drug therapy: Secondary | ICD-10-CM

## 2023-04-24 ENCOUNTER — Other Ambulatory Visit: Payer: Self-pay

## 2023-04-24 DIAGNOSIS — Z79899 Other long term (current) drug therapy: Secondary | ICD-10-CM

## 2023-04-25 LAB — CBC WITH DIFFERENTIAL/PLATELET
Absolute Lymphocytes: 1450 {cells}/uL (ref 850–3900)
Absolute Monocytes: 539 {cells}/uL (ref 200–950)
Basophils Absolute: 49 {cells}/uL (ref 0–200)
Basophils Relative: 1 %
Eosinophils Absolute: 348 {cells}/uL (ref 15–500)
Eosinophils Relative: 7.1 %
HCT: 41.8 % (ref 38.5–50.0)
Hemoglobin: 14.2 g/dL (ref 13.2–17.1)
MCH: 32.3 pg (ref 27.0–33.0)
MCHC: 34 g/dL (ref 32.0–36.0)
MCV: 95 fL (ref 80.0–100.0)
MPV: 11.8 fL (ref 7.5–12.5)
Monocytes Relative: 11 %
Neutro Abs: 2514 {cells}/uL (ref 1500–7800)
Neutrophils Relative %: 51.3 %
Platelets: 169 10*3/uL (ref 140–400)
RBC: 4.4 10*6/uL (ref 4.20–5.80)
RDW: 12 % (ref 11.0–15.0)
Total Lymphocyte: 29.6 %
WBC: 4.9 10*3/uL (ref 3.8–10.8)

## 2023-04-25 LAB — COMPLETE METABOLIC PANEL WITH GFR
AG Ratio: 1.9 (calc) (ref 1.0–2.5)
ALT: 104 U/L — ABNORMAL HIGH (ref 9–46)
AST: 64 U/L — ABNORMAL HIGH (ref 10–35)
Albumin: 4.5 g/dL (ref 3.6–5.1)
Alkaline phosphatase (APISO): 35 U/L (ref 35–144)
BUN: 18 mg/dL (ref 7–25)
CO2: 25 mmol/L (ref 20–32)
Calcium: 9.4 mg/dL (ref 8.6–10.3)
Chloride: 103 mmol/L (ref 98–110)
Creat: 1.17 mg/dL (ref 0.70–1.35)
Globulin: 2.4 g/dL (ref 1.9–3.7)
Glucose, Bld: 106 mg/dL — ABNORMAL HIGH (ref 65–99)
Potassium: 4.6 mmol/L (ref 3.5–5.3)
Sodium: 137 mmol/L (ref 135–146)
Total Bilirubin: 0.9 mg/dL (ref 0.2–1.2)
Total Protein: 6.9 g/dL (ref 6.1–8.1)
eGFR: 71 mL/min/{1.73_m2} (ref >=60)

## 2023-04-26 NOTE — Progress Notes (Signed)
CBC is normal.  Liver functions remain elevated.  Please check if patient has stopped taking NSAIDs and alcohol use.  I recommend referral to a gastroenterologist for the evaluation of elevated LFTs if patient is in agreement.

## 2023-04-28 ENCOUNTER — Other Ambulatory Visit: Payer: Self-pay | Admitting: *Deleted

## 2023-04-28 DIAGNOSIS — Z79899 Other long term (current) drug therapy: Secondary | ICD-10-CM

## 2023-04-28 DIAGNOSIS — M316 Other giant cell arteritis: Secondary | ICD-10-CM

## 2023-04-28 DIAGNOSIS — R7989 Other specified abnormal findings of blood chemistry: Secondary | ICD-10-CM

## 2023-04-28 MED ORDER — ACTEMRA ACTPEN 162 MG/0.9ML ~~LOC~~ SOAJ
SUBCUTANEOUS | 0 refills | Status: DC
Start: 2023-04-28 — End: 2023-07-24

## 2023-04-28 NOTE — Telephone Encounter (Signed)
Last Fill: 12/29/2022  Labs: 04/24/2023  CBC is normal.  Liver functions remain elevated.  Please check if patient has stopped taking NSAIDs and alcohol use.  I recommend referral to a gastroenterologist for the evaluation of elevated LFTs if patient is in agreement.   TB Gold: 11/06/2022  TB Gold is negative.    Next Visit: 06/26/2023  Last Visit: 01/08/2023    DX: Temporal arteritis   Current Dose per office note 01/08/2023: Actemra 162 mg subcu every 7 days   Okay to refill Actemra?

## 2023-05-08 ENCOUNTER — Other Ambulatory Visit: Payer: Self-pay | Admitting: Internal Medicine

## 2023-05-26 ENCOUNTER — Encounter: Payer: Self-pay | Admitting: Gastroenterology

## 2023-06-02 ENCOUNTER — Encounter: Payer: No Typology Code available for payment source | Admitting: Nurse Practitioner

## 2023-06-12 NOTE — Progress Notes (Deleted)
 Office Visit Note  Patient: Stephen Campbell             Date of Birth: [birth year]           MRN: [id number]             PCP: Lucky Cowboy, MD Referring: Lucky Cowboy, MD Visit Date: 06/26/2023 Occupation: @GUAROCC @  Subjective:  No chief complaint on file.   History of Present Illness: Stephen Campbell is a 62 y.o. male ***     Activities of Daily Living:  Patient reports morning stiffness for *** {minute/hour:19697}.   Patient {ACTIONS;DENIES/REPORTS:21021675::"Denies"} nocturnal pain.  Difficulty dressing/grooming: {ACTIONS;DENIES/REPORTS:21021675::"Denies"} Difficulty climbing stairs: {ACTIONS;DENIES/REPORTS:21021675::"Denies"} Difficulty getting out of chair: {ACTIONS;DENIES/REPORTS:21021675::"Denies"} Difficulty using hands for taps, buttons, cutlery, and/or writing: {ACTIONS;DENIES/REPORTS:21021675::"Denies"}  No Rheumatology ROS completed.   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   Temporal arteritis (HCC) 07/11/2021   HLA B27 (HLA B27 positive) 11/16/2020   B12 deficiency 05/19/2020   Medication management 05/04/2019   Vitamin D deficiency 05/04/2019   History of basal cell carcinoma 02/09/2015   Psoriasis 02/09/2015   Testicular hypofunction 08/18/2014   DJD of shoulder 08/18/2014   Hypertension    Hyperlipidemia    Allergy    Asthma    Depression    GERD (gastroesophageal reflux disease)    ED (erectile dysfunction)    Plantar fasciitis 01/06/2012    Past Medical History:  Diagnosis Date   Allergy    Anxiety    Asthma    Basal cell carcinoma    Depression    ED (erectile dysfunction)    GERD (gastroesophageal reflux disease)    Hyperlipidemia    Hypertension    Hypogonadism in male    Osteoarthritis    Temporal arteritis (HCC)    Vitamin D deficiency     Family History  Problem Relation Age of  Onset   Uterine cancer Mother    Asthma Father    Kidney disease Father    Rheum arthritis Father    Aortic stenosis Father        valve replacement   Ankylosing spondylitis Brother    Heart disease Brother    Cancer Paternal Grandmother    Heart attack Paternal Grandfather    Colon cancer Neg Hx    Colon polyps Neg Hx    Gallbladder disease Neg Hx    Esophageal cancer Neg Hx    Diabetes Neg Hx    Past Surgical History:  Procedure Laterality Date   HERNIA REPAIR Bilateral 1970   62 yrs old, inguinal    LASIK     ORTHOPEDIC SURGERY Left    left shoulder, bone spur, Dr. Samul Dada   PTOSIS REPAIR Bilateral 11/2020   Dr. Toni Arthurs - [facility]   scalp biopsy     basal cell    VASECTOMY  2013   Social History   Social History Narrative   Not on file   Immunization History  Administered Date(s) Administered   Influenza Inj Mdck Quad Pf 04/24/2017   Influenza Inj Mdck Quad With Preservative 02/26/2018, 05/04/2020   Influenza Split 03/28/2013, 03/30/2014   Influenza, Seasonal, Injecte, Preservative Fre 05/04/2015   Influenza,inj,Quad PF,6+ Mos 05/30/2022   PFIZER Comirnaty(Gray Top)Covid-19 Tri-Sucrose Vaccine 03/21/2021   PFIZER(Purple Top)SARS-COV-2 Vaccination 06/24/2019, 07/15/2019, 06/10/2020   Tdap 02/20/2017     Objective: Vital Signs: There were no  vitals taken for this visit.   Physical Exam   Musculoskeletal Exam: ***  CDAI Exam: CDAI Score: -- Patient Global: --; Provider Global: -- Swollen: --; Tender: -- Joint Exam 06/26/2023   No joint exam has been documented for this visit   There is currently no information documented on the homunculus. Go to the Rheumatology activity and complete the homunculus joint exam.  Investigation: No additional findings.  Imaging: No results found.  Recent Labs: Lab Results  Component Value Date   WBC 4.9 04/24/2023   HGB 14.2 04/24/2023   PLT 169 04/24/2023   NA 137 04/24/2023   K 4.6 04/24/2023   CL 103  04/24/2023   CO2 25 04/24/2023   GLUCOSE 106 (H) 04/24/2023   BUN 18 04/24/2023   CREATININE 1.17 04/24/2023   BILITOT 0.9 04/24/2023   ALKPHOS 39 (L) 08/15/2016   AST 64 (H) 04/24/2023   ALT 104 (H) 04/24/2023   PROT 6.9 04/24/2023   ALBUMIN 4.4 08/15/2016   CALCIUM 9.4 04/24/2023   GFRAA 101 11/16/2020   QFTBGOLDPLUS NEGATIVE 11/06/2022    Speciality Comments: Actemra started 08/15/21  Procedures:  No procedures performed Allergies: Enalapril   Assessment / Plan:     Visit Diagnoses: Temporal arteritis (HCC)  High risk medication use  Psoriasis  HLA B27 positive  Long term (current) use of systemic steroids  Primary osteoarthritis of both hands  Paresthesia of both hands  Trochanteric bursitis of both hips  Chondromalacia patellae, right knee  Primary osteoarthritis of both feet  Plantar fasciitis  Bilateral calcaneal spurs  DDD (degenerative disc disease), cervical  Degeneration of intervertebral disc of lumbar region without discogenic back pain or lower extremity pain  History of hypertension  Vitamin D deficiency  History of gastroesophageal reflux (GERD)  History of hypercholesterolemia  History of asthma  History of basal cell carcinoma  History of anxiety  Elevated LFTs  Orders: No orders of the defined types were placed in this encounter.  No orders of the defined types were placed in this encounter.   Face-to-face time spent with patient was *** minutes. Greater than 50% of time was spent in counseling and coordination of care.  Follow-Up Instructions: No follow-ups on file.   Gearldine Bienenstock, PA-C  Note - This record has been created using Dragon software.  Chart creation errors have been sought, but may not always  have been located. Such creation errors do not reflect on  the standard of medical care.

## 2023-06-15 ENCOUNTER — Ambulatory Visit (INDEPENDENT_AMBULATORY_CARE_PROVIDER_SITE_OTHER): Payer: No Typology Code available for payment source | Admitting: Nurse Practitioner

## 2023-06-15 ENCOUNTER — Encounter: Payer: Self-pay | Admitting: Nurse Practitioner

## 2023-06-15 VITALS — BP 128/88 | HR 73 | Temp 98.0°F | Ht 72.0 in | Wt 195.8 lb

## 2023-06-15 DIAGNOSIS — N401 Enlarged prostate with lower urinary tract symptoms: Secondary | ICD-10-CM

## 2023-06-15 DIAGNOSIS — Z131 Encounter for screening for diabetes mellitus: Secondary | ICD-10-CM | POA: Diagnosis not present

## 2023-06-15 DIAGNOSIS — N529 Male erectile dysfunction, unspecified: Secondary | ICD-10-CM

## 2023-06-15 DIAGNOSIS — Z1329 Encounter for screening for other suspected endocrine disorder: Secondary | ICD-10-CM | POA: Diagnosis not present

## 2023-06-15 DIAGNOSIS — F3342 Major depressive disorder, recurrent, in full remission: Secondary | ICD-10-CM

## 2023-06-15 DIAGNOSIS — Z1389 Encounter for screening for other disorder: Secondary | ICD-10-CM | POA: Diagnosis not present

## 2023-06-15 DIAGNOSIS — I10 Essential (primary) hypertension: Secondary | ICD-10-CM | POA: Diagnosis not present

## 2023-06-15 DIAGNOSIS — R350 Frequency of micturition: Secondary | ICD-10-CM | POA: Diagnosis not present

## 2023-06-15 DIAGNOSIS — Z136 Encounter for screening for cardiovascular disorders: Secondary | ICD-10-CM | POA: Diagnosis not present

## 2023-06-15 DIAGNOSIS — Z79899 Other long term (current) drug therapy: Secondary | ICD-10-CM | POA: Diagnosis not present

## 2023-06-15 DIAGNOSIS — Z13220 Encounter for screening for lipoid disorders: Secondary | ICD-10-CM | POA: Diagnosis not present

## 2023-06-15 DIAGNOSIS — Z0000 Encounter for general adult medical examination without abnormal findings: Secondary | ICD-10-CM | POA: Diagnosis not present

## 2023-06-15 DIAGNOSIS — Z125 Encounter for screening for malignant neoplasm of prostate: Secondary | ICD-10-CM

## 2023-06-15 DIAGNOSIS — Z85828 Personal history of other malignant neoplasm of skin: Secondary | ICD-10-CM

## 2023-06-15 DIAGNOSIS — Z0001 Encounter for general adult medical examination with abnormal findings: Secondary | ICD-10-CM

## 2023-06-15 DIAGNOSIS — E559 Vitamin D deficiency, unspecified: Secondary | ICD-10-CM

## 2023-06-15 DIAGNOSIS — E785 Hyperlipidemia, unspecified: Secondary | ICD-10-CM

## 2023-06-15 DIAGNOSIS — E538 Deficiency of other specified B group vitamins: Secondary | ICD-10-CM

## 2023-06-15 DIAGNOSIS — J45909 Unspecified asthma, uncomplicated: Secondary | ICD-10-CM

## 2023-06-15 DIAGNOSIS — R002 Palpitations: Secondary | ICD-10-CM

## 2023-06-15 DIAGNOSIS — K2100 Gastro-esophageal reflux disease with esophagitis, without bleeding: Secondary | ICD-10-CM

## 2023-06-15 DIAGNOSIS — L409 Psoriasis, unspecified: Secondary | ICD-10-CM

## 2023-06-15 DIAGNOSIS — E291 Testicular hypofunction: Secondary | ICD-10-CM

## 2023-06-15 NOTE — Progress Notes (Signed)
Complete Physical  Assessment and Plan:  Encounter for Annual Physical Exam with abnormal findings Due annually  Health Maintenance reviewed Healthy lifestyle reviewed and goals set  Primary hypertension Discussed DASH (Dietary Approaches to Stop Hypertension) DASH diet is lower in sodium than a typical American diet. Cut back on foods that are high in saturated fat, cholesterol, and trans fats. Eat more whole-grain foods, fish, poultry, and nuts Remain active and exercise as tolerated daily.  Monitor BP at home-Call if greater than 130/80.  Check CMP/CBC   Hyperlipidemia Discussed lifestyle modifications. Recommended diet heavy in fruits and veggies, omega 3's. Decrease consumption of animal meats, cheeses, and dairy products. Remain active and exercise as tolerated. Continue to monitor. Check lipids/TSH   Hypogonadism male Hypogonadism- continue replacement therapy, check testosterone levels as needed.    Asthma, mild intermittent, uncomplicated Controlled Continue inhaler as needed.  Depression, remission Off of meds, doing well, continues counseling for maintenance  Gastroesophageal reflux disease with esophagitis No suspected reflux complications (Barret/stricture). Lifestyle modification:  wt loss, avoid meals 2-3h before bedtime. Consider eliminating food triggers:  chocolate, caffeine, EtOH, acid/spicy food.   Erectile dysfunction, unspecified erectile dysfunction type Continue Levitra Discussed changing to a different medication if no longer effective.   Vitamin D deficiency Continue supplement Monitor levels  History of basal cell carcinoma Follows Derm, reminded to schedule  Psoriasis Follows derm annually and rheum PRN Continue Actemra  B12 deficiency -     Vitamin B12  Screening PSA (prostate specific antigen) -     PSA  Screening for cardiovascular condition/Palpitations Monitor EKG Stay well hydrated Reduce caffeine intake Monitor  stress levels Discussed placement of cardiac holter monitor if s/s fail to improve.   Screening for DM Monitor A1c  Screening for hematuria and proteinuria Monitor Microalbumin/UA  Medication management All medications discussed and reviewed in full. All questions and concerns regarding medications addressed.    Screening for thyroid  Monitor TSH  Orders Placed This Encounter  Procedures   CBC with Differential/Platelet   COMPLETE METABOLIC PANEL WITH GFR   Magnesium   Lipid panel   TSH   Hemoglobin A1c   Insulin, random   VITAMIN D 25 Hydroxy (Vit-D Deficiency, Fractures)   Urinalysis, Routine w reflex microscopic   Microalbumin / creatinine urine ratio   PSA   Testosterone   Sedimentation rate   C-reactive protein   EKG 12-Lead   Notify office for further evaluation and treatment, questions or concerns if any reported s/s fail to improve.   The patient was advised to call back or seek an in-person evaluation if any symptoms worsen or if the condition fails to improve as anticipated.   Further disposition pending results of labs. Discussed med's effects and SE's.    I discussed the assessment and treatment plan with the patient. The patient was provided an opportunity to ask questions and all were answered. The patient agreed with the plan and demonstrated an understanding of the instructions.  Discussed med's effects and SE's. Screening labs and tests as requested with regular follow-up as recommended.  I provided 40 minutes of face-to-face time during this encounter including counseling, chart review, and critical decision making was preformed.   Future Appointments  Date Time Provider Department Center  06/26/2023 11:20 AM Pollyann Savoy, MD [facility] None  08/07/2023 10:00 AM Legrand Como, PA-C [facility]  06/14/2024 10:00 AM Adela Glimpse, NP [facility] None    HPI 62 y.o. male presents for CPE. He has Plantar fasciitis; Allergy;  Asthma;  Depression; GERD (gastroesophageal reflux disease); ED (erectile dysfunction); Hypertension; Hyperlipidemia; Testicular hypofunction; DJD of shoulder; History of basal cell carcinoma; Psoriasis; Medication management; Vitamin D deficiency; B12 deficiency; HLA B27 (HLA B27 positive); Temporal arteritis (HCC); Squamous cell carcinoma of right thigh; and Mild concentric left ventricular hypertrophy (LVH) on their problem list.   Married, no children, 2 dogs. He is a Education officer, community.  Questioning retirement.   Has noticed increase in LUE weakness with numbness and tingling.  Dropping things.  Currently underlying observation and intervention with Neurology - had recent steroid injection without much relief or improvement.    He has history of HLAB27+, joint pain, has psoriasis and possible psoriatic arthritis, recently diagnosed with temporal arteritis.  He follows intermittently with Dr. Titus Dubin, at this time he is only doing topical treatment and mobic PRN, no DMARD.  Feels well controlled.  Hx of depression in remission off of meds, continues with counseling with Vernell Leep and finds this beneficial. Uses xanax rarely as needed. Denies extreme mood swings.   He has allergies, well controlled on current regimen.  Hx of GERD, known hiatal hernia per patient,  on omeprazole daily PM and famotidine PRN.  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 He continues to follow as scheduled.  BMI is Body mass index is 26.56 kg/m., he has been working on diet and exercise. Wt Readings from Last 3 Encounters:  06/15/23 195 lb 12.8 oz (88.8 kg)  01/08/23 190 lb (86.2 kg)  12/11/22 186 lb 6.4 oz (84.6 kg)    His blood pressure has been controlled at home (110s-120s/60s), he is taking 20 mg olmesartan, today their BP is BP: 128/88.  He does workout. He denies chest pain, shortness of breath, dizziness. Does note occasional palpitations,  felt this may be due to lack of hydration and sleep.   He had negative at home screening sleep study with dentist.  He is on cholesterol medication, atorvastatin 10 mg daily and denies myalgias. His cholesterol is at goal. The cholesterol last visit was:   Lab Results  Component Value Date   CHOL 203 (H) 12/11/2022   HDL 57 12/11/2022   LDLCALC 118 (H) 12/11/2022   TRIG 160 (H) 12/11/2022   CHOLHDL 3.6 12/11/2022    Last A1C in the office was:  Lab Results  Component Value Date   HGBA1C 5.4 05/30/2022   He has a history of testosterone deficiency and has been on in the past testosterone replacement (natesto topical- he reports does forget to take occasionally). He states that the testosterone helps with his energy, libido, muscle mass. He has ED, back on levitra 20 mg which seemed to work better for him. Interested in changing medications d/t cost.  Lab Results  Component Value Date   TESTOSTERONE 668 05/30/2022    He does have ongoing dribbling (life long) and the stream has gotten mildly slower, denies sense of incomplete emptying. Discussed flomax but did not start taking d/t SE.  Lab Results  Component Value Date   PSA 1.05 05/30/2022   PSA 0.77 05/24/2021   PSA 0.88 05/18/2020   Patient is on Vitamin D supplement.   Lab Results  Component Value Date   VD25OH 9 12/11/2022   He admits not regularly taking supplement -  Lab Results  Component Value Date   VITAMINB12 334 05/30/2022   History of elevated LFTs - has consultation with GI.  Lab Results  Component Value Date   ALT 104 (H) 04/24/2023   AST 64 (H) 04/24/2023   ALKPHOS 39 (L) 08/15/2016   BILITOT 0.9 04/24/2023    Current Medications:  Current Outpatient Medications on File Prior to Visit  Medication Sig Dispense Refill   albuterol (VENTOLIN HFA) 108 (90 Base) MCG/ACT inhaler Use 2 inhalations    15 minutes apart     every 4 hours     to rescue Asthma Attack 48 g 3   atorvastatin (LIPITOR) 10 MG tablet  Take  1 tablet Daily for Cholesterol                                            /                                                                   TAKE                                         BY                                                 MOUTH                          ? ? ?  ONCE ? ? ?  DAILY ? ? ? 90 tablet 3   azelastine (ASTELIN) 0.1 % nasal spray Place 1 spray into both nostrils 2 (two) times daily.     famotidine (PEPCID) 40 MG tablet TAKE 1 TABLET AT BEDTIME FOR ACID REFLUX AND INDIGESTION. 90 tablet 3   levocetirizine (XYZAL) 5 MG tablet Take 5 mg by mouth as needed.     MAGNESIUM PO Take by mouth daily.     NATESTO 5.5 MG/ACT GEL use 1 pump in each nostril three times daily *wash hands after use 21.96 g 5   olmesartan (BENICAR) 40 MG tablet TAKE 1 TABLET BY MOUTH DAILY 90 tablet 3   Polyethylene Glycol 3350 (MIRALAX PO) Take by mouth.     Tocilizumab (ACTEMRA ACTPEN) 162 MG/0.9ML SOAJ INJECT 1 PEN SUBCUTANEOUSLY  WEEKLY 10.8 mL 0   triamcinolone cream (KENALOG) 0.1 % Apply 1 application topically 2 (two) times daily. 45 g 1   vardenafil (LEVITRA) 20 MG tablet Take 1 tablet Daily if Needed for XXXX                                            /                    TAKE ONE TABLET BY MOUTH 30 tablet 1   benzonatate (TESSALON) 200 MG capsule Take 1 perle 3 x / day to prevent cough (Patient not taking: Reported  on 06/15/2023) 30 capsule 1   diclofenac sodium (VOLTAREN) 1 % GEL 3 g to 3 large joints three times daily (Patient not taking: Reported on 06/15/2023) 3 Tube 3   meloxicam (MOBIC) 15 MG tablet Take 1/2 to 1 tablet Daily for Pain & Inflammation                                                                          /                                                                   TAKE                                         BY                                                 MOUTH (Patient not taking: Reported on 06/15/2023) 90 tablet 3   omeprazole (PRILOSEC) 40 MG capsule Take  1  capsule  Daily  to Prevent Heartburn & Indigestion 90 capsule 3   tamsulosin (FLOMAX) 0.4 MG CAPS capsule Take 1 tablet at Bedtime for Prostate (Patient not taking: Reported on 06/15/2023) 90 capsule 3   No current facility-administered medications on file prior to visit.   Health Maintenance:  Immunization History  Administered Date(s) Administered   Influenza Inj Mdck Quad Pf 04/24/2017   Influenza Inj Mdck Quad With Preservative 02/26/2018, 05/04/2020   Influenza Split 03/28/2013, 03/30/2014   Influenza, Seasonal, Injecte, Preservative Fre 05/04/2015   Influenza,inj,Quad PF,6+ Mos 05/30/2022   Influenza-Unspecified 03/17/2023   PFIZER Comirnaty(Gray Top)Covid-19 Tri-Sucrose Vaccine 03/21/2021   PFIZER(Purple Top)SARS-COV-2 Vaccination 06/24/2019, 07/15/2019, 06/10/2020   Tdap 02/20/2017   Unspecified SARS-COV-2 Vaccination 03/17/2023   Zoster Recombinant(Shingrix) 03/17/2023   Tetanus: 2018 Prevnar 20: age 40 Flu vaccine: 03/2023 Shingrix: - 1st dose completed - due for second  Covid 19: 2/2, 2021, pfizer 05/22/23  Colonoscopy: 02/23/2015 due 10 years EGD:  Last eye: 2024, annually, glasses, had ptosis - giant cell art 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, 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, is a dentist  Last derm: 2024, derm, dx with [facility]  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   Temporal arteritis (HCC) 07/11/2021   HLA B27 (HLA B27 positive) 11/16/2020   B12 deficiency 05/19/2020   Medication management 05/04/2019   Vitamin D deficiency 05/04/2019   History of basal cell carcinoma 02/09/2015   Psoriasis 02/09/2015   Testicular hypofunction 08/18/2014   DJD of shoulder 08/18/2014   Hypertension    Hyperlipidemia    Allergy    Asthma    Depression    GERD (gastroesophageal reflux  disease)    ED (erectile dysfunction)    Plantar fasciitis 01/06/2012    Allergies Allergies  Allergen Reactions   Enalapril Cough    SURGICAL HISTORY He  has a past surgical history that includes Vasectomy (2013); orthopedic surgery (Left); Hernia repair (Bilateral, 1970); LASIK; scalp biopsy; and Ptosis repair (Bilateral, 11/2020). FAMILY HISTORY His family history includes Ankylosing spondylitis in his brother; Aortic stenosis in his father; Asthma in his father; Cancer in his paternal grandmother; Heart attack in his paternal grandfather; Heart disease in his brother; Kidney disease in his father; Rheum arthritis in his father; Uterine cancer in his mother. SOCIAL HISTORY He  reports that he has quit smoking. His smoking use included cigars. He has never been exposed to tobacco smoke. He has never used smokeless tobacco. He reports current alcohol use of about 14.0 standard drinks of alcohol per week. He reports that he does not use drugs.   Review of Systems  Constitutional:  Negative for malaise/fatigue and weight loss.  HENT:  Negative for congestion, hearing loss, sore throat and tinnitus.   Eyes:  Negative for blurred vision and double vision.  Respiratory:  Negative for cough, shortness of breath and wheezing.   Cardiovascular:  Negative for chest pain, palpitations, orthopnea, claudication and leg swelling.  Gastrointestinal:  Negative for abdominal pain, blood in stool, constipation, diarrhea, heartburn, melena, nausea and vomiting.  Genitourinary: Negative.   Musculoskeletal:  Negative for joint pain (intermittent R hip, working with massage) and myalgias.  Skin:  Negative for rash.  Neurological:  Negative for dizziness, tingling, sensory change, weakness and headaches.  Endo/Heme/Allergies:  Negative for polydipsia.  Psychiatric/Behavioral:  Negative for depression, memory loss and suicidal ideas. The patient is nervous/anxious (intermittent). The patient does not have insomnia.   All other systems reviewed and are negative.   Physical  Exam: Estimated body mass index is 26.56 kg/m as calculated from the following:   Height as of this encounter: 6' (1.829 m).   Weight as of this encounter: 195 lb 12.8 oz (88.8 kg). BP 128/88   Pulse 73   Temp 98 F (36.7 C)   Ht 6' (1.829 m)   Wt 195 lb 12.8 oz (88.8 kg)   SpO2 98%   BMI 26.56 kg/m  General Appearance: Well nourished, in no apparent distress. Eyes: PERRLA, EOMs, conjunctiva no swelling or erythema. Mild left lid ptosis.  Sinuses: No Frontal/maxillary tenderness ENT/Mouth: Ext aud canals clear, normal light reflex with TMs without erythema, bulging. Good dentition. No erythema, swelling, or exudate on post pharynx. Tonsils not swollen or erythematous. Hearing normal.  Neck: Supple, thyroid normal. No bruits Respiratory: Respiratory effort normal, BS equal bilaterally without rales, rhonchi, wheezing or stridor. Cardio: RRR without murmurs, rubs or gallops. Brisk peripheral pulses without edema.  Chest: symmetric, with normal excursions and percussion. Abdomen: Soft, +BS. Non tender, no guarding, rebound, hernias, masses, or organomegaly.  Lymphatics: Non tender without lymphadenopathy.  Genitourinary: defer Musculoskeletal: Full ROM all peripheral extremities,5/5 strength, and normal gait. Mildly tender R medial epicondyle without erythema or swelling. Wrist full ROM without crepitus, bony enlargement, neg phalen's, intact strength and distal sensation Skin: Warm, dry without rashes, lesions, ecchymosis.  Neuro: Cranial nerves intact, reflexes equal bilaterally. Normal muscle tone, no cerebellar symptoms. Sensation intact.  Psych: Awake and oriented X 3, normal affect, Insight and Judgment appropriate.   EKG: Sinus rhythm, WNL  Luisa Louk 10:31 AM

## 2023-06-15 NOTE — Patient Instructions (Signed)
Palpitations Palpitations are feelings that your heartbeat is not normal. Your heartbeat may feel like it is: Uneven (irregular). Faster than normal. Fluttering. Skipping a beat. This is usually not a serious problem. However, a doctor will do tests and check your medical history to make sure that you do not have a serious heart problem. Follow these instructions at home: Watch for any changes in your condition. Tell your doctor about any changes. Take these actions to help manage your symptoms: Eating and drinking Follow instructions from your doctor about things to eat and drink. You may be told to avoid these things: Drinks that have caffeine in them, such as coffee, tea, soft drinks, and energy drinks. Chocolate. Alcohol. Diet pills. Lifestyle     Try to lower your stress. These things can help you relax: Yoga. Deep breathing and meditation. Guided imagery. This is using words and images to create positive thoughts. Exercise, including swimming, jogging, and walking. Tell your doctor if you have more abnormal heartbeats when you are active. If you have chest pain or feel short of breath with exercise, do not keep doing the exercise until you are seen by your doctor. Biofeedback. This is using your mind to control things in your body, such as your heartbeat. Get plenty of rest and sleep. Keep a regular bed time. Do not use drugs, such as cocaine or ecstasy. Do not use marijuana. Do not smoke or use any products that contain nicotine or tobacco. If you need help quitting, ask your doctor. General instructions Take over-the-counter and prescription medicines only as told by your doctor. Keep all follow-up visits. You may need more tests if palpitations do not go away or get worse. Contact a doctor if: You keep having fast or uneven heartbeats for a long time. Your symptoms happen more often. Get help right away if: You have chest pain. You feel short of breath. You have a very  bad headache. You feel dizzy. You faint. These symptoms may be an emergency. Get help right away. Call your local emergency services (911 in the [place] Do not wait to see if the symptoms will go away. Do not drive yourself to the hospital. Summary Palpitations are feelings that your heartbeat is uneven or faster than normal. It may feel like your heart is fluttering or skipping a beat. Avoid food and drinks that may cause this condition. These include caffeine, chocolate, and alcohol. Try to lower your stress. Do not smoke or use drugs. Get help right away if you faint, feel dizzy, feel short of breath, have chest pain, or have a very bad headache. This information is not intended to replace advice given to you by your health care provider. Make sure you discuss any questions you have with your health care provider. Document Revised: 10/24/2020 Document Reviewed: 10/24/2020 Elsevier Patient Education  2024 [employer]  

## 2023-06-16 LAB — PSA: PSA: 1.19 ng/mL (ref <=4.00)

## 2023-06-16 LAB — COMPLETE METABOLIC PANEL WITH GFR
AG Ratio: 1.9 (calc) (ref 1.0–2.5)
ALT: 77 U/L — ABNORMAL HIGH (ref 9–46)
AST: 62 U/L — ABNORMAL HIGH (ref 10–35)
Albumin: 4.5 g/dL (ref 3.6–5.1)
Alkaline phosphatase (APISO): 36 U/L (ref 35–144)
BUN: 14 mg/dL (ref 7–25)
CO2: 27 mmol/L (ref 20–32)
Calcium: 9.6 mg/dL (ref 8.6–10.3)
Chloride: 104 mmol/L (ref 98–110)
Creat: 1.06 mg/dL (ref 0.70–1.35)
Globulin: 2.4 g/dL (ref 1.9–3.7)
Glucose, Bld: 107 mg/dL — ABNORMAL HIGH (ref 65–99)
Potassium: 4.3 mmol/L (ref 3.5–5.3)
Sodium: 139 mmol/L (ref 135–146)
Total Bilirubin: 1.6 mg/dL — ABNORMAL HIGH (ref 0.2–1.2)
Total Protein: 6.9 g/dL (ref 6.1–8.1)
eGFR: 80 mL/min/{1.73_m2} (ref >=60)

## 2023-06-16 LAB — LIPID PANEL
Cholesterol: 206 mg/dL — ABNORMAL HIGH (ref <200)
HDL: 47 mg/dL (ref >=40)
LDL Cholesterol (Calc): 130 mg/dL — ABNORMAL HIGH
Non-HDL Cholesterol (Calc): 159 mg/dL — ABNORMAL HIGH (ref <130)
Total CHOL/HDL Ratio: 4.4 (calc) (ref <5.0)
Triglycerides: 177 mg/dL — ABNORMAL HIGH (ref <150)

## 2023-06-16 LAB — MICROALBUMIN / CREATININE URINE RATIO
Creatinine, Urine: 111 mg/dL (ref 20–320)
Microalb Creat Ratio: 3 mg/g{creat} (ref <30)
Microalb, Ur: 0.3 mg/dL

## 2023-06-16 LAB — URINALYSIS, ROUTINE W REFLEX MICROSCOPIC
Bilirubin Urine: NEGATIVE
Glucose, UA: NEGATIVE
Hgb urine dipstick: NEGATIVE
Ketones, ur: NEGATIVE
Leukocytes,Ua: NEGATIVE
Nitrite: NEGATIVE
Protein, ur: NEGATIVE
Specific Gravity, Urine: 1.01 (ref 1.001–1.035)
pH: 7.5 (ref 5.0–8.0)

## 2023-06-16 LAB — CBC WITH DIFFERENTIAL/PLATELET
Absolute Lymphocytes: 1029 {cells}/uL (ref 850–3900)
Absolute Monocytes: 429 {cells}/uL (ref 200–950)
Basophils Absolute: 52 {cells}/uL (ref 0–200)
Basophils Relative: 1.4 %
Eosinophils Absolute: 241 {cells}/uL (ref 15–500)
Eosinophils Relative: 6.5 %
HCT: 40.6 % (ref 38.5–50.0)
Hemoglobin: 13.6 g/dL (ref 13.2–17.1)
MCH: 31.8 pg (ref 27.0–33.0)
MCHC: 33.5 g/dL (ref 32.0–36.0)
MCV: 94.9 fL (ref 80.0–100.0)
MPV: 11.5 fL (ref 7.5–12.5)
Monocytes Relative: 11.6 %
Neutro Abs: 1950 {cells}/uL (ref 1500–7800)
Neutrophils Relative %: 52.7 %
Platelets: 175 10*3/uL (ref 140–400)
RBC: 4.28 10*6/uL (ref 4.20–5.80)
RDW: 12.4 % (ref 11.0–15.0)
Total Lymphocyte: 27.8 %
WBC: 3.7 10*3/uL — ABNORMAL LOW (ref 3.8–10.8)

## 2023-06-16 LAB — HEMOGLOBIN A1C
Hgb A1c MFr Bld: 5.6 %{Hb} (ref <5.7)
Mean Plasma Glucose: 114 mg/dL
eAG (mmol/L): 6.3 mmol/L

## 2023-06-16 LAB — VITAMIN D 25 HYDROXY (VIT D DEFICIENCY, FRACTURES): Vit D, 25-Hydroxy: 24 ng/mL — ABNORMAL LOW (ref 30–100)

## 2023-06-16 LAB — TESTOSTERONE: Testosterone: 420 ng/dL (ref 250–827)

## 2023-06-16 LAB — MAGNESIUM: Magnesium: 1.7 mg/dL (ref 1.5–2.5)

## 2023-06-16 LAB — INSULIN, RANDOM: Insulin: 10.3 u[IU]/mL

## 2023-06-16 LAB — SEDIMENTATION RATE: Sed Rate: 2 mm/h (ref 0–20)

## 2023-06-16 LAB — C-REACTIVE PROTEIN: CRP: <3 mg/L (ref <8.0)

## 2023-06-16 LAB — TSH: TSH: 0.55 m[IU]/L (ref 0.40–4.50)

## 2023-06-24 NOTE — Progress Notes (Signed)
 Office Visit Note  Patient: Stephen Campbell             Date of Birth: 21-Jun-1960           MRN: [id number]             PCP: Tonita Fallow, MD Referring: Tonita Fallow, MD Visit Date: 06/25/2023 Occupation: @GUAROCC @  Subjective:  Medication management  History of Present Illness: [name] is a 63 y.o. male with temporal arteritis, osteoarthritis and degenerative disc disease.  He denies any episodes of headaches.  He has been off prednisone  for several months now.  He continues to take Actemra  162 mg subcu every 7 days.  He would like to decrease the dose of Actemra .  He denies any muscular weakness or tenderness.  He states he has been having paresthesias in his bilateral hands and decreased grip strength in his hands.  He had cervical spine injections about 3 weeks ago and did not notice any improvement.  He states he was taking meloxicam  and NSAIDs for the neck pain.  He stopped taking the medications now.  He has cut back on wine intake also.  He continues to have infrequent oral ulcers.    Activities of Daily Living:  Patient reports morning stiffness for 15 minutes.   Patient Reports nocturnal pain.  Difficulty dressing/grooming: Denies Difficulty climbing stairs: Denies Difficulty getting out of chair: Denies Difficulty using hands for taps, buttons, cutlery, and/or writing: Reports  Review of Systems  Constitutional:  Positive for fatigue.  HENT:  Positive for mouth sores. Negative for mouth dryness.   Eyes:  Positive for dryness.  Respiratory:  Negative for shortness of breath.   Cardiovascular:  Positive for palpitations. Negative for chest pain.  Gastrointestinal:  Negative for blood in stool, constipation and diarrhea.  Endocrine: Negative for increased urination.  Genitourinary:  Negative for involuntary urination.  Musculoskeletal:  Positive for joint pain, joint pain, myalgias, muscle weakness, morning stiffness and myalgias. Negative for gait problem, joint  swelling and muscle tenderness.  Skin:  Positive for rash. Negative for color change, hair loss and sensitivity to sunlight.  Allergic/Immunologic: Negative for susceptible to infections.  Neurological:  Negative for dizziness and headaches.  Hematological:  Negative for swollen glands.  Psychiatric/Behavioral:  Positive for sleep disturbance. Negative for depressed mood. The patient is not nervous/anxious.     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   Hypertension    Hyperlipidemia    Allergy    Asthma    Depression    GERD (gastroesophageal reflux disease)    ED (erectile dysfunction)    Plantar fasciitis 01/06/2012    Past Medical History:  Diagnosis Date   Allergy    Anxiety    Asthma    Basal cell carcinoma    Depression    ED (erectile dysfunction)    GERD (gastroesophageal reflux disease)    Hyperlipidemia    Hypertension    Hypogonadism in male    Osteoarthritis    Temporal arteritis (HCC)    Vitamin D  deficiency  Family History  Problem Relation Age of Onset   Uterine cancer Mother    Asthma Father    Kidney disease Father    Rheum arthritis Father    Aortic stenosis Father        valve replacement   Ankylosing spondylitis Brother    Heart disease Brother    Cancer Paternal Grandmother    Heart attack Paternal Grandfather    Colon cancer Neg Hx    Colon polyps Neg Hx    Gallbladder disease Neg Hx    Esophageal cancer Neg Hx    Diabetes Neg Hx    Past Surgical History:  Procedure Laterality Date    HERNIA REPAIR Bilateral 1970   63 yrs old, inguinal    LASIK     ORTHOPEDIC SURGERY Left    left shoulder, bone spur, Dr. Deneise   PTOSIS REPAIR Bilateral 11/2020   Dr. Melba - [facility]   scalp biopsy     basal cell    VASECTOMY  2013   Social History   Social History Narrative   Not on file   Immunization History  Administered Date(s) Administered   Influenza Inj Mdck Quad Pf 04/24/2017   Influenza Inj Mdck Quad With Preservative 02/26/2018, 05/04/2020   Influenza Split 03/28/2013, 03/30/2014   Influenza, Seasonal, Injecte, Preservative Fre 05/04/2015   Influenza,inj,Quad PF,6+ Mos 05/30/2022   Influenza-Unspecified 03/17/2023   PFIZER Comirnaty(Gray Top)Covid-19 Tri-Sucrose Vaccine 03/21/2021   PFIZER(Purple Top)SARS-COV-2 Vaccination 06/24/2019, 07/15/2019, 06/10/2020   Tdap 02/20/2017   Unspecified SARS-COV-2 Vaccination 03/17/2023   Zoster Recombinant(Shingrix ) 03/17/2023     Objective: Vital Signs: BP 123/80 (BP Location: Left Arm, Patient Position: Sitting, Cuff Size: Normal)   Pulse 90   Resp 15   Ht 5' 11.75 (1.822 m)   Wt 191 lb (86.6 kg)   BMI 26.09 kg/m    Physical Exam Vitals and nursing note reviewed.  Constitutional:      Appearance: He is well-developed.  HENT:     Head: Normocephalic and atraumatic.  Eyes:     Conjunctiva/sclera: Conjunctivae normal.     Pupils: Pupils are equal, round, and reactive to light.  Cardiovascular:     Rate and Rhythm: Normal rate and regular rhythm.     Heart sounds: Normal heart sounds.  Pulmonary:     Effort: Pulmonary effort is normal.     Breath sounds: Normal breath sounds.  Abdominal:     General: Bowel sounds are normal.     Palpations: Abdomen is soft.  Musculoskeletal:     Cervical back: Normal range of motion and neck supple.  Skin:    General: Skin is warm and dry.     Capillary Refill: Capillary refill takes less than 2 seconds.  Neurological:     Mental Status: He is alert and oriented  to person, place, and time.     Comments: No temporal artery tenderness was noted.  No upper or lower extremity proximal muscle weakness was noted.  Psychiatric:        Behavior: Behavior normal.      Musculoskeletal Exam: He had limited lateral rotation of the cervical spine.  However there was no tenderness over thoracic or lumbar spine.  Shoulders, elbows, wrist joints, MCPs PIPs and DIPs Juengel range of motion with no synovitis.  He had decreased strength with the pincer grasp.  Hip joints and knee joints in good range of motion.  There was no tenderness over ankles or MTPs.  CDAI Exam: CDAI  Score: -- Patient Global: --; Provider Global: -- Swollen: --; Tender: -- Joint Exam 06/25/2023   No joint exam has been documented for this visit   There is currently no information documented on the homunculus. Go to the Rheumatology activity and complete the homunculus joint exam.  Investigation: No additional findings.  Imaging: No results found.  Recent Labs: Lab Results  Component Value Date   WBC 3.7 (L) 06/15/2023   HGB 13.6 06/15/2023   PLT 175 06/15/2023   NA 139 06/15/2023   K 4.3 06/15/2023   CL 104 06/15/2023   CO2 27 06/15/2023   GLUCOSE 107 (H) 06/15/2023   BUN 14 06/15/2023   CREATININE 1.06 06/15/2023   BILITOT 1.6 (H) 06/15/2023   ALKPHOS 39 (L) 08/15/2016   AST 62 (H) 06/15/2023   ALT 77 (H) 06/15/2023   PROT 6.9 06/15/2023   ALBUMIN 4.4 08/15/2016   CALCIUM  9.6 06/15/2023   GFRAA 101 11/16/2020   QFTBGOLDPLUS NEGATIVE 11/06/2022    Speciality Comments: Actemra  started 08/15/21  Procedures:  No procedures performed Allergies: Enalapril    Assessment / Plan:     Visit Diagnoses: Temporal arteritis (HCC) -patient had no recurrence of temporal arteritis since he started Actemra  and prednisone .  He had no temporal artery tenderness.  Sedimentation rate was normal recently.  He would like to taper Actemra .  We discussed decreasing Actemra  to 162 mg subcu  every other week.  Will check sed rate in 3 months per patient's request.  Plan: Sedimentation rate  High risk medication use - Actemra  162 mg subcu every 7 days.  He has been off prednisone  since April 03, 2022.  CBC showed low white cell count of 3.7 on June 15, 2023 due to immunosuppression.  AST and ALT were elevated but improved.  Patient has cut back on NSAID use and alcohol intake.  Elevated LFTs - He drinks 2 glasses of wine at night.  Patient states that he is cut back on alcohol intake and also stopped NSAIDs.  He was taking over-the-counter products which hide aspirin.  He recently stopped that also.  Psoriasis - Uses topical agents for occasional lesions.  HLA B27 positive  Primary osteoarthritis of both hands-currently not very bothersome.  Paresthesia of both hands - Nerve conduction velocities in the past showed mild carpal tunnel syndrome.  He states that he has been having increased paresthesias in his hands and also decreased grip strength which he relates to degenerative disc disease.  Trochanteric bursitis of both hips-not symptomatic.  Chondromalacia patellae, right knee-he denies any discomfort today.  Primary osteoarthritis of both feet-no discomfort today.  Plantar fasciitis-no recent recurrence.  He has been using arch support.  DDD (degenerative disc disease), cervical - MRI done previously showed moderate spinal canal stenosis and degenerative changes.  He is following up with neurosurgeon.  He had recent injections.  He may need to cervical fusion.  Degeneration of intervertebral disc of lumbar region without discogenic back pain or lower extremity pain  Vitamin D  deficiency -vitamin D  was low at 24.  I gave him a prescription for vitamin D  50,000 units once a week for 3 months.  Will recheck vitamin D  level in 3 months.  Plan: VITAMIN D  25 Hydroxy (Vit-D Deficiency, Fractures)  History of hypertension-blood pressure was normal today.  Other medical  problems and listed as follows:  History of hypercholesterolemia  History of gastroesophageal reflux (GERD)  History of asthma  History of basal cell carcinoma  History of anxiety  Orders:  Orders Placed This Encounter  Procedures   Sedimentation rate   VITAMIN D  25 Hydroxy (Vit-D Deficiency, Fractures)   Meds ordered this encounter  Medications   Vitamin D , Ergocalciferol , (DRISDOL ) 1.25 MG (50000 UNIT) CAPS capsule    Sig: Take 1 capsule (50,000 Units total) by mouth every 7 (seven) days.    Dispense:  12 capsule    Refill:  0     Follow-Up Instructions: Return in about 5 months (around 11/23/2023) for Temporal arteritis, Osteoarthritis.   Maya Nash, MD  Note - This record has been created using Animal nutritionist.  Chart creation errors have been sought, but may not always  have been located. Such creation errors do not reflect on  the standard of medical care.

## 2023-06-25 ENCOUNTER — Ambulatory Visit: Payer: No Typology Code available for payment source | Attending: Rheumatology | Admitting: Rheumatology

## 2023-06-25 ENCOUNTER — Encounter: Payer: Self-pay | Admitting: Rheumatology

## 2023-06-25 VITALS — BP 123/80 | HR 90 | Resp 15 | Ht 71.75 in | Wt 191.0 lb

## 2023-06-25 DIAGNOSIS — M7062 Trochanteric bursitis, left hip: Secondary | ICD-10-CM

## 2023-06-25 DIAGNOSIS — Z8679 Personal history of other diseases of the circulatory system: Secondary | ICD-10-CM

## 2023-06-25 DIAGNOSIS — M19041 Primary osteoarthritis, right hand: Secondary | ICD-10-CM

## 2023-06-25 DIAGNOSIS — M19072 Primary osteoarthritis, left ankle and foot: Secondary | ICD-10-CM

## 2023-06-25 DIAGNOSIS — Z8639 Personal history of other endocrine, nutritional and metabolic disease: Secondary | ICD-10-CM

## 2023-06-25 DIAGNOSIS — Z8709 Personal history of other diseases of the respiratory system: Secondary | ICD-10-CM

## 2023-06-25 DIAGNOSIS — E559 Vitamin D deficiency, unspecified: Secondary | ICD-10-CM

## 2023-06-25 DIAGNOSIS — M51369 Other intervertebral disc degeneration, lumbar region without mention of lumbar back pain or lower extremity pain: Secondary | ICD-10-CM

## 2023-06-25 DIAGNOSIS — Z85828 Personal history of other malignant neoplasm of skin: Secondary | ICD-10-CM

## 2023-06-25 DIAGNOSIS — Z8719 Personal history of other diseases of the digestive system: Secondary | ICD-10-CM

## 2023-06-25 DIAGNOSIS — M5030 Other cervical disc degeneration, unspecified cervical region: Secondary | ICD-10-CM

## 2023-06-25 DIAGNOSIS — M316 Other giant cell arteritis: Secondary | ICD-10-CM | POA: Diagnosis not present

## 2023-06-25 DIAGNOSIS — Z1589 Genetic susceptibility to other disease: Secondary | ICD-10-CM

## 2023-06-25 DIAGNOSIS — L409 Psoriasis, unspecified: Secondary | ICD-10-CM

## 2023-06-25 DIAGNOSIS — M19042 Primary osteoarthritis, left hand: Secondary | ICD-10-CM

## 2023-06-25 DIAGNOSIS — R7989 Other specified abnormal findings of blood chemistry: Secondary | ICD-10-CM | POA: Diagnosis not present

## 2023-06-25 DIAGNOSIS — Z8659 Personal history of other mental and behavioral disorders: Secondary | ICD-10-CM

## 2023-06-25 DIAGNOSIS — Z79899 Other long term (current) drug therapy: Secondary | ICD-10-CM

## 2023-06-25 DIAGNOSIS — R202 Paresthesia of skin: Secondary | ICD-10-CM

## 2023-06-25 DIAGNOSIS — M7061 Trochanteric bursitis, right hip: Secondary | ICD-10-CM

## 2023-06-25 DIAGNOSIS — M722 Plantar fascial fibromatosis: Secondary | ICD-10-CM

## 2023-06-25 DIAGNOSIS — M2241 Chondromalacia patellae, right knee: Secondary | ICD-10-CM

## 2023-06-25 DIAGNOSIS — M19071 Primary osteoarthritis, right ankle and foot: Secondary | ICD-10-CM

## 2023-06-25 MED ORDER — VITAMIN D (ERGOCALCIFEROL) 1.25 MG (50000 UNIT) PO CAPS: 50000.0000 [IU] | ORAL_CAPSULE | ORAL | 0 refills | Status: AC

## 2023-06-25 NOTE — Patient Instructions (Signed)
 Standing Labs We placed an order today for your standing lab work.   Please have your standing labs drawn in March and every 3 months  Please have your labs drawn 2 weeks prior to your appointment so that the provider can discuss your lab results at your appointment, if possible.  Please note that you may see your imaging and lab results in MyChart before we have reviewed them. We will contact you once all results are reviewed. Please allow our office up to 72 hours to thoroughly review all of the results before contacting the office for clarification of your results.  WALK-IN LAB HOURS  Monday through Thursday from 8:00 am -12:30 pm and 1:00 pm-5:00 pm and Friday from 8:00 am-12:00 pm.  Patients with office visits requiring labs will be seen before walk-in labs.  You may encounter longer than normal wait times. Please allow additional time. Wait times may be shorter on  Monday and Thursday afternoons.  We do not book appointments for walk-in labs. We appreciate your patience and understanding with our staff.   Labs are drawn by [facility]. Please bring your co-pay at the time of your lab draw.  You may receive a bill from [facility] for your lab work.  Please note if you are on Hydroxychloroquine and and an order has been placed for a Hydroxychloroquine level,  you will need to have it drawn 4 hours or more after your last dose.  If you wish to have your labs drawn at another location, please call the office 24 hours in advance so we can fax the orders.  The office is located at [address], [place], [place] [address]   If you have any questions regarding directions or hours of operation,  please call [phone].   As a reminder, please drink plenty of water prior to coming for your lab work. Thanks!   Vaccines You are taking a medication(s) that can suppress your immune system.  The following immunizations are recommended: Flu annually Covid-19  RSV Td/Tdap (tetanus,  diphtheria, pertussis) every 10 years Pneumonia (Prevnar 15 then Pneumovax 23 at least 1 year apart.  Alternatively, can take Prevnar 20 without needing additional dose) Shingrix: 2 doses from 4 weeks to 6 months apart  Please check with your PCP to make sure you are up to date.   If you have signs or symptoms of an infection or start antibiotics: First, call your PCP for workup of your infection. Hold your medication through the infection, until you complete your antibiotics, and until symptoms resolve if you take the following: Injectable medication (Actemra, Benlysta, Cimzia, Cosentyx, Enbrel, Humira, Kevzara, Orencia, Remicade, Simponi, Stelara, Taltz, Tremfya) Methotrexate Leflunomide (Arava) Mycophenolate (Cellcept) Harriette Ohara, Olumiant, or Rinvoq

## 2023-06-26 ENCOUNTER — Ambulatory Visit: Payer: No Typology Code available for payment source | Admitting: Rheumatology

## 2023-06-26 DIAGNOSIS — Z1589 Genetic susceptibility to other disease: Secondary | ICD-10-CM

## 2023-06-26 DIAGNOSIS — Z8659 Personal history of other mental and behavioral disorders: Secondary | ICD-10-CM

## 2023-06-26 DIAGNOSIS — M19072 Primary osteoarthritis, left ankle and foot: Secondary | ICD-10-CM

## 2023-06-26 DIAGNOSIS — R202 Paresthesia of skin: Secondary | ICD-10-CM

## 2023-06-26 DIAGNOSIS — Z79899 Other long term (current) drug therapy: Secondary | ICD-10-CM

## 2023-06-26 DIAGNOSIS — E559 Vitamin D deficiency, unspecified: Secondary | ICD-10-CM

## 2023-06-26 DIAGNOSIS — R7989 Other specified abnormal findings of blood chemistry: Secondary | ICD-10-CM

## 2023-06-26 DIAGNOSIS — Z8709 Personal history of other diseases of the respiratory system: Secondary | ICD-10-CM

## 2023-06-26 DIAGNOSIS — M2241 Chondromalacia patellae, right knee: Secondary | ICD-10-CM

## 2023-06-26 DIAGNOSIS — M722 Plantar fascial fibromatosis: Secondary | ICD-10-CM

## 2023-06-26 DIAGNOSIS — M7061 Trochanteric bursitis, right hip: Secondary | ICD-10-CM

## 2023-06-26 DIAGNOSIS — Z7952 Long term (current) use of systemic steroids: Secondary | ICD-10-CM

## 2023-06-26 DIAGNOSIS — Z8719 Personal history of other diseases of the digestive system: Secondary | ICD-10-CM

## 2023-06-26 DIAGNOSIS — L409 Psoriasis, unspecified: Secondary | ICD-10-CM

## 2023-06-26 DIAGNOSIS — M19041 Primary osteoarthritis, right hand: Secondary | ICD-10-CM

## 2023-06-26 DIAGNOSIS — Z8639 Personal history of other endocrine, nutritional and metabolic disease: Secondary | ICD-10-CM

## 2023-06-26 DIAGNOSIS — M5030 Other cervical disc degeneration, unspecified cervical region: Secondary | ICD-10-CM

## 2023-06-26 DIAGNOSIS — M316 Other giant cell arteritis: Secondary | ICD-10-CM

## 2023-06-26 DIAGNOSIS — Z85828 Personal history of other malignant neoplasm of skin: Secondary | ICD-10-CM

## 2023-06-26 DIAGNOSIS — M7731 Calcaneal spur, right foot: Secondary | ICD-10-CM

## 2023-06-26 DIAGNOSIS — M51369 Other intervertebral disc degeneration, lumbar region without mention of lumbar back pain or lower extremity pain: Secondary | ICD-10-CM

## 2023-06-26 DIAGNOSIS — Z8679 Personal history of other diseases of the circulatory system: Secondary | ICD-10-CM

## 2023-06-29 ENCOUNTER — Telehealth: Payer: Self-pay | Admitting: *Deleted

## 2023-06-29 NOTE — Telephone Encounter (Signed)
 Received fax from [facility] stating they have been trying to reach patient to refill Actemra. They have been unsuccessful in reaching patient. Call back number is [phone]. Left message to advise patient to contact the pharmacy to set up shipment.

## 2023-07-24 ENCOUNTER — Other Ambulatory Visit: Payer: Self-pay | Admitting: Physician Assistant

## 2023-07-24 DIAGNOSIS — M316 Other giant cell arteritis: Secondary | ICD-10-CM

## 2023-07-24 DIAGNOSIS — Z79899 Other long term (current) drug therapy: Secondary | ICD-10-CM

## 2023-07-24 NOTE — Telephone Encounter (Signed)
 Last Fill: 04/28/2023  Labs: 06/15/2023 WBC 3.7, Glucose 107, Total Bilirubin 1.6, AST 62, ALT 77  TB Gold: 11/06/2022 Neg    Next Visit: 12/11/2023  Last Visit: 06/25/2023  IK:Uzfenmjo arteritis   Current Dose per office note 06/25/2023: Actemra  162 mg subcu every 7 days.   Okay to refill Actemra ?

## 2023-08-07 ENCOUNTER — Encounter: Payer: Self-pay | Admitting: Gastroenterology

## 2023-08-07 ENCOUNTER — Other Ambulatory Visit: Payer: No Typology Code available for payment source

## 2023-08-07 ENCOUNTER — Ambulatory Visit (INDEPENDENT_AMBULATORY_CARE_PROVIDER_SITE_OTHER): Payer: PRIVATE HEALTH INSURANCE | Admitting: Gastroenterology

## 2023-08-07 VITALS — BP 150/90 | HR 80 | Ht 72.0 in | Wt 186.5 lb

## 2023-08-07 DIAGNOSIS — R7989 Other specified abnormal findings of blood chemistry: Secondary | ICD-10-CM

## 2023-08-07 DIAGNOSIS — K760 Fatty (change of) liver, not elsewhere classified: Secondary | ICD-10-CM | POA: Diagnosis not present

## 2023-08-07 DIAGNOSIS — F1090 Alcohol use, unspecified, uncomplicated: Secondary | ICD-10-CM | POA: Diagnosis not present

## 2023-08-07 LAB — CBC WITH DIFFERENTIAL/PLATELET
Basophils Absolute: 0 10*3/uL (ref 0.0–0.1)
Basophils Relative: 1.2 % (ref 0.0–3.0)
Eosinophils Absolute: 0.2 10*3/uL (ref 0.0–0.7)
Eosinophils Relative: 5.9 % — ABNORMAL HIGH (ref 0.0–5.0)
HCT: 44.5 % (ref 39.0–52.0)
Hemoglobin: 15.4 g/dL (ref 13.0–17.0)
Lymphocytes Relative: 36 % (ref 12.0–46.0)
Lymphs Abs: 1.4 10*3/uL (ref 0.7–4.0)
MCHC: 34.6 g/dL (ref 30.0–36.0)
MCV: 94.3 fL (ref 78.0–100.0)
Monocytes Absolute: 0.5 10*3/uL (ref 0.1–1.0)
Monocytes Relative: 12.5 % — ABNORMAL HIGH (ref 3.0–12.0)
Neutro Abs: 1.8 10*3/uL (ref 1.4–7.7)
Neutrophils Relative %: 44.4 % (ref 43.0–77.0)
Platelets: 155 10*3/uL (ref 150.0–400.0)
RBC: 4.72 Mil/uL (ref 4.22–5.81)
RDW: 12.3 % (ref 11.5–15.5)
WBC: 4 10*3/uL (ref 4.0–10.5)

## 2023-08-07 LAB — COMPREHENSIVE METABOLIC PANEL
ALT: 98 U/L — ABNORMAL HIGH (ref 0–53)
AST: 50 U/L — ABNORMAL HIGH (ref 0–37)
Albumin: 4.8 g/dL (ref 3.5–5.2)
Alkaline Phosphatase: 37 U/L — ABNORMAL LOW (ref 39–117)
BUN: 16 mg/dL (ref 6–23)
CO2: 26 meq/L (ref 19–32)
Calcium: 9.5 mg/dL (ref 8.4–10.5)
Chloride: 104 meq/L (ref 96–112)
Creatinine, Ser: 1.03 mg/dL (ref 0.40–1.50)
GFR: 79.07 mL/min (ref >60.00)
Glucose, Bld: 94 mg/dL (ref 70–99)
Potassium: 4.2 meq/L (ref 3.5–5.1)
Sodium: 139 meq/L (ref 135–145)
Total Bilirubin: 1.2 mg/dL (ref 0.2–1.2)
Total Protein: 7.4 g/dL (ref 6.0–8.3)

## 2023-08-07 LAB — PROTIME-INR
INR: 1.1 {ratio} — ABNORMAL HIGH (ref 0.8–1.0)
Prothrombin Time: 11.3 s (ref 9.6–13.1)

## 2023-08-07 LAB — IBC + FERRITIN
Ferritin: 394.8 ng/mL — ABNORMAL HIGH (ref 22.0–322.0)
Iron: 163 ug/dL (ref 42–165)
Saturation Ratios: 52.2 % — ABNORMAL HIGH (ref 20.0–50.0)
TIBC: 312.2 ug/dL (ref 250.0–450.0)
Transferrin: 223 mg/dL (ref 212.0–360.0)

## 2023-08-07 LAB — GAMMA GT: GGT: 93 U/L — ABNORMAL HIGH (ref 7–51)

## 2023-08-07 NOTE — Patient Instructions (Addendum)
Your provider has requested that you go to the basement level for lab work before leaving today. Press "B" on the elevator. The lab is located at the first door on the left as you exit the elevator.  Due to recent changes in healthcare laws, you may see the results of your imaging and laboratory studies on MyChart before your provider has had a chance to review them.  We understand that in some cases there may be results that are confusing or concerning to you. Not all laboratory results come back in the same time frame and the provider may be waiting for multiple results in order to interpret others.  Please give [place] 48 hours in order for your provider to thoroughly review all the results before contacting the office for clarification of your results.    You have been scheduled for an abdominal ultrasound at [facility] (1st floor of hospital) on 08/10/23 at 10:00 AM. Please arrive 15 minutes prior to your appointment for registration. Make certain not to have anything to eat or drink after midnight. Should you need to reschedule your appointment, please contact radiology at [phone]. This test typically takes about 30 minutes to perform.  Follow up as needed.  Thank you for trusting me with your gastrointestinal care!   Boone Master, PA   ______________________________________________________  If your blood pressure at your visit was 140/90 or greater, please contact your primary care physician to follow up on this.  _______________________________________________________  If you are age 12 or older, your body mass index should be between 23-30. Your Body mass index is 25.29 kg/m. If this is out of the aforementioned range listed, please consider follow up with your Primary Care Provider.  If you are age 60 or younger, your body mass index should be between 19-25. Your Body mass index is 25.29 kg/m. If this is out of the aformentioned range listed, please consider follow up  with your Primary Care Provider.   ________________________________________________________  The [facility] providers would like to encourage you to use [facility] to communicate with providers for non-urgent requests or questions.  Due to long hold times on the telephone, sending your provider a message by [facility] may be a faster and more efficient way to get a response.  Please allow 48 business hours for a response.  Please remember that this is for non-urgent requests.  _______________________________________________________

## 2023-08-07 NOTE — Progress Notes (Signed)
Chief Complaint: Primary GI MD:  HPI: 63 year old male with medical history as listed below presents for evaluation of elevated LFTs  Over the last year patient has been noted to have slightly elevated LFTs in a hepatocellular pattern.  Appears they have been intermittently elevated since 2015  02/2022: AST 47, ALT 71, alk phos 28 05/2022: AST 29, ALT 41, alk phos 31 08/2022: AST 37, ALT 55, alk phos 30 11/2022: AST 74, ALT 101, alk phos 37, T. bili 1.9 04/2023: AST 64, ALT 104, alk phos 35, total bilirubin 0.9 05/2023: AST 62, ALT 77, alk phos 36, T. bili 1.6, normal CBC  US abdomen complete 12/2019 done for elevated LFTs showed no gallstones or gallbladder wall thickening.  Tiny 3 to 4 mm gallbladder polyp.  Fatty liver disease.  Patient is on medications such as olmesartan, Prilosec, famotidine, atorvastatin, and Actemra  Patient states he is here today to discuss his elevation in LFTs.  He has no symptoms.  Denies pain, nausea, vomiting, rectal bleeding, change in bowel habits.  His rheumatologist referred him for a persistently elevated LFTs for further evaluation.  He is on Actemra for temporal arteritis.  He typically drinks about 3 glasses of wine per night but decrease this January 2025.  Of note, he recently got back from [facility] in which he admits to indulging in alcohol.  He has also been struggling with an illness and taking DayQuil/NyQuil on a regular basis.  Denies family history of liver issues or colon cancer   PREVIOUS GI WORKUP   Colonoscopy 02/2015 - Severe diverticulosis in sigmoid colon - Normal examination - Repeat 10 years (02/2025)  Past Medical History:  Diagnosis Date   Allergy    Anxiety    Asthma    Basal cell carcinoma    Depression    ED (erectile dysfunction)    GERD (gastroesophageal reflux disease)    Hyperlipidemia    Hypertension    Hypogonadism in male    Osteoarthritis    Temporal arteritis (HCC)    Vitamin D deficiency     Past  Surgical History:  Procedure Laterality Date   HERNIA REPAIR Bilateral 1970   63 yrs old, inguinal    LASIK     ORTHOPEDIC SURGERY Left    left shoulder, bone spur, Dr. Samul Dada   PTOSIS REPAIR Bilateral 11/2020   Dr. Toni Arthurs - [facility]   scalp biopsy     basal cell    VASECTOMY  2013    Current Outpatient Medications  Medication Sig Dispense Refill   ACTEMRA ACTPEN 162 MG/0.9ML SOAJ INJECT 1 PEN SUBCUTANEOUSLY  WEEKLY 3.6 mL 2   albuterol (VENTOLIN HFA) 108 (90 Base) MCG/ACT inhaler Use 2 inhalations    15 minutes apart     every 4 hours     to rescue Asthma Attack 48 g 3   atorvastatin (LIPITOR) 10 MG tablet Take  1 tablet Daily for Cholesterol                                            /  TAKE                                         BY                                                 MOUTH                          ? ? ?  ONCE ? ? ?  DAILY ? ? ? 90 tablet 3   azelastine (ASTELIN) 0.1 % nasal spray Place 1 spray into both nostrils 2 (two) times daily.     famotidine (PEPCID) 40 MG tablet TAKE 1 TABLET AT BEDTIME FOR ACID REFLUX AND INDIGESTION. 90 tablet 3   levocetirizine (XYZAL) 5 MG tablet Take 5 mg by mouth as needed.     MAGNESIUM PO Take by mouth daily.     NATESTO 5.5 MG/ACT GEL use 1 pump in each nostril three times daily *wash hands after use 21.96 g 5   olmesartan (BENICAR) 40 MG tablet TAKE 1 TABLET BY MOUTH DAILY 90 tablet 3   omeprazole (PRILOSEC) 40 MG capsule Take  1 capsule  Daily  to Prevent Heartburn & Indigestion 90 capsule 3   Polyethylene Glycol 3350 (MIRALAX PO) Take by mouth.     triamcinolone cream (KENALOG) 0.1 % Apply 1 application topically 2 (two) times daily. 45 g 1   vardenafil (LEVITRA) 20 MG tablet Take 1 tablet Daily if Needed for XXXX                                            /                    TAKE ONE TABLET BY MOUTH 30 tablet 1   Vitamin D, Ergocalciferol, (DRISDOL) 1.25 MG  (50000 UNIT) CAPS capsule Take 1 capsule (50,000 Units total) by mouth every 7 (seven) days. 12 capsule 0   No current facility-administered medications for this visit.    Allergies as of 08/07/2023 - Review Complete 08/07/2023  Allergen Reaction Noted   Enalapril Cough 11/23/2017    Family History  Problem Relation Age of Onset   Uterine cancer Mother    Asthma Father    Kidney disease Father    Rheum arthritis Father    Aortic stenosis Father        valve replacement   Ankylosing spondylitis Brother    Heart disease Brother    Cancer Paternal Grandmother    Heart attack Paternal Grandfather    Colon cancer Neg Hx    Colon polyps Neg Hx    Gallbladder disease Neg Hx    Esophageal cancer Neg Hx    Diabetes Neg Hx     Social History   Socioeconomic History   Marital status: Married    Spouse name: Not on file   Number of children: 0   Years of education: Not on file   Highest education level: Not on file  Occupational History   Occupation: Dentist  Tobacco Use   Smoking status: Former  Types: Cigars    Passive exposure: Never   Smokeless tobacco: Never   Tobacco comments:    smokes cigar rarely, every few years  Vaping Use   Vaping status: Never Used  Substance and Sexual Activity   Alcohol use: Yes    Alcohol/week: 2.0 standard drinks of alcohol    Types: 2 Glasses of wine per week   Drug use: No   Sexual activity: Yes    Partners: Female    Birth control/protection: Surgical  Other Topics Concern   Not on file  Social History Narrative   Not on file   Social Drivers of Health   Financial Resource Strain: Not on file  Food Insecurity: Not on file  Transportation Needs: Not on file  Physical Activity: Not on file  Stress: Not on file  Social Connections: Not on file  Intimate Partner Violence: Not on file    Review of Systems:    Constitutional: No weight loss, fever, chills, weakness or fatigue HEENT: Eyes: No change in vision                Ears, Nose, Throat:  No change in hearing or congestion Skin: No rash or itching Cardiovascular: No chest pain, chest pressure or palpitations   Respiratory: No SOB or cough Gastrointestinal: See HPI and otherwise negative Genitourinary: No dysuria or change in urinary frequency Neurological: No headache, dizziness or syncope Musculoskeletal: No new muscle or joint pain Hematologic: No bleeding or bruising Psychiatric: No history of depression or anxiety    Physical Exam:  Vital signs: BP (!) 150/90   Pulse 80   Ht 6' (1.829 m)   Wt 186 lb 8 oz (84.6 kg)   BMI 25.29 kg/m   Constitutional: NAD, Well developed, Well nourished, alert and cooperative Head:  Normocephalic and atraumatic. Eyes:   PEERL, EOMI. No icterus. Conjunctiva pink. Respiratory: Respirations even and unlabored. Lungs clear to auscultation bilaterally.   No wheezes, crackles, or rhonchi.  Cardiovascular:  Regular rate and rhythm. No peripheral edema, cyanosis or pallor.  Gastrointestinal:  Soft, nondistended, nontender. No rebound or guarding. Normal bowel sounds. No appreciable masses or hepatomegaly. Rectal:  Not performed.  Msk:  Symmetrical without gross deformities. Without edema, no deformity or joint abnormality.  Neurologic:  Alert and  oriented x4;  grossly normal neurologically.  Skin:   Dry and intact without significant lesions or rashes. Psychiatric: Oriented to person, place and time. Demonstrates good judgement and reason without abnormal affect or behaviors.   RELEVANT LABS AND IMAGING: CBC    Component Value Date/Time   WBC 3.7 (L) 06/15/2023 1117   RBC 4.28 06/15/2023 1117   HGB 13.6 06/15/2023 1117   HCT 40.6 06/15/2023 1117   PLT 175 06/15/2023 1117   MCV 94.9 06/15/2023 1117   MCH 31.8 06/15/2023 1117   MCHC 33.5 06/15/2023 1117   RDW 12.4 06/15/2023 1117   LYMPHSABS 1,302 12/11/2022 1035   MONOABS 590 08/15/2016 0920   EOSABS 241 06/15/2023 1117   BASOSABS 52 06/15/2023 1117     CMP     Component Value Date/Time   NA 139 06/15/2023 1117   K 4.3 06/15/2023 1117   CL 104 06/15/2023 1117   CO2 27 06/15/2023 1117   GLUCOSE 107 (H) 06/15/2023 1117   BUN 14 06/15/2023 1117   CREATININE 1.06 06/15/2023 1117   CALCIUM 9.6 06/15/2023 1117   PROT 6.9 06/15/2023 1117   ALBUMIN 4.4 08/15/2016 0920   AST 62 (H) 06/15/2023 1117  ALT 77 (H) 06/15/2023 1117   ALKPHOS 39 (L) 08/15/2016 0920   BILITOT 1.6 (H) 06/15/2023 1117   GFRNONAA 87 11/16/2020 0912   GFRAA 101 11/16/2020 0912     Assessment/Plan:   Elevated LFTs History of fatty liver disease Intermittently elevated LFTs since 2015 with persistently elevated LFTs and hepatocellular pattern since 2023.  He was put on Actemra for temporal arteritis in 2023 which is known to elevate liver enzymes.  He is also on atorvastatin.  Although both of these medicines can increase his LFTs he does have a history of fatty liver disease and significant alcohol use of 3 glasses of wine per night which she is trying to cut back.  Has elevation LFTs is likely due to medication versus fatty liver. - Full serologic workup for elevated LFTs including alpha-1 antitrypsin deficiency, ceruloplasmin, ASMA, ANA, AMA, PT/INR, iron studies plus ferritin, GGT, CBC, CMP, hep B surface antigen, hep B surface antigen, hep C antibody, hep A antibody total - RUQ ultrasound for further evaluation - Educated patient on fatty liver disease and recommended cessation of alcohol and continue diet/exercise  Colon cancer screening Patient due for repeat colon cancer screening 2026   Boone Master, PA-C [facility] 08/07/2023, 11:26 AM  Cc: Lucky Cowboy, MD

## 2023-08-07 NOTE — Progress Notes (Signed)
Agree with expanding workup for chronically elevated hepatic transaminases

## 2023-08-10 ENCOUNTER — Ambulatory Visit (HOSPITAL_COMMUNITY): Admission: RE | Admit: 2023-08-10 | Payer: PRIVATE HEALTH INSURANCE | Source: Ambulatory Visit

## 2023-08-12 LAB — ALPHA-1-ANTITRYPSIN: A-1 Antitrypsin, Ser: 124 mg/dL (ref 83–199)

## 2023-08-12 LAB — ANTI-SMOOTH MUSCLE ANTIBODY, IGG: Actin (Smooth Muscle) Antibody (IGG): <20 U (ref <20)

## 2023-08-12 LAB — HEPATITIS A ANTIBODY, TOTAL: Hepatitis A AB,Total: NONREACTIVE

## 2023-08-12 LAB — CERULOPLASMIN: Ceruloplasmin: 14 mg/dL (ref 14–30)

## 2023-08-12 LAB — ANA: Anti Nuclear Antibody (ANA): NEGATIVE

## 2023-08-12 LAB — HEPATITIS B SURFACE ANTIGEN: Hepatitis B Surface Ag: NONREACTIVE

## 2023-08-12 LAB — MITOCHONDRIAL ANTIBODIES: Mitochondrial M2 Ab, IgG: <=20 U (ref <=20.0)

## 2023-08-12 LAB — HEPATITIS B SURFACE ANTIBODY,QUALITATIVE: Hep B S Ab: REACTIVE — AB

## 2023-08-12 LAB — HEPATITIS C ANTIBODY: Hepatitis C Ab: NONREACTIVE

## 2023-08-31 ENCOUNTER — Telehealth: Payer: Self-pay | Admitting: *Deleted

## 2023-08-31 NOTE — Telephone Encounter (Signed)
 Patient called, pharmacy stated patient's copay is $3500 for Actemra, it is usually $5. Patient has same policy with new deductible levels.

## 2023-09-02 NOTE — Telephone Encounter (Signed)
 Spoke with [facility] rep regarding Actemra.  Copay card only covered a little more than $100. Patient may have depleted copay card funds but rep unable to tell. They had talked to patient and transferred him to the copay card company rep but had to drop off of the line due to security purposes.  ATC patient to determine what was discussed with copay card company. Unable to reach. Left VM for return call  Chesley Mires, PharmD, MPH, BCPS, CPP Clinical Pharmacist (Rheumatology and Pulmonology)

## 2023-09-10 ENCOUNTER — Other Ambulatory Visit (HOSPITAL_BASED_OUTPATIENT_CLINIC_OR_DEPARTMENT_OTHER): Payer: Self-pay

## 2023-09-10 MED ORDER — ZOSTER VAC RECOMB ADJUVANTED 50 MCG/0.5ML IM SUSR
0.5000 mL | Freq: Once | INTRAMUSCULAR | 0 refills | Status: AC
  Filled 2023-09-10: qty 0.5, 1d supply, fill #0

## 2023-09-11 ENCOUNTER — Telehealth: Payer: Self-pay | Admitting: Rheumatology

## 2023-09-11 NOTE — Telephone Encounter (Signed)
 Pt came in requesting for the pharmacist to give him a call to go over medication billing problems. Pt would like a call at [phone].

## 2023-09-14 NOTE — Telephone Encounter (Signed)
 Per previous encounter:   Attempted to contact pt to discuss, left VoiceMail requesting a return call. Direct office number provided.

## 2023-09-14 NOTE — Telephone Encounter (Signed)
 Patient returned call, he explained to me what had been discussed between him and the copay card company once the rep from [employer] had disconnected. Unfortunately it sounds like they were stating to him that the issue was on [employer]'s side rather than their side. Advised pt that I would follow back up with the copay card company and try to get some more definitive answers on what exactly needs to happen in order for pt to resume enjoying cheaper copays.  Pt informed me that he has enough mediation remaining to last a few weeks should this process take more time than anticipated to resolve.

## 2023-09-21 NOTE — Telephone Encounter (Signed)
 Pt returned call, I conferenced in the Actemra copay card hotline. Per rep I was not allowed to be on the line due to the nature of the call, however it appears that (since this is the pt's second time calling) that the matter needs to be escalated and the pt will be contacted by a member of the Benefits Specialist team in 2 business days. Advised pt to give me a call once he has been contacted or if he has NOT been contacted within the allotted time estimation.  Will await further correspondence.

## 2023-09-21 NOTE — Telephone Encounter (Signed)
 Contacted the Actemra copay card hotline. Spoke with rep who informs me that pt needs to speak with a benefits specialist. He has apparently already done this, as it appears that he spoke with someone named Wynona Canes back on 3/17. At this time the rep cannot tell me why to why it appears that nothing came of this call. I attempted to conference call pt in, however I had to leave a VM requesting he call me back. Will await f/u.

## 2023-09-23 NOTE — Telephone Encounter (Signed)
 Received VM from pt, he states that he was able to get everything resolved with the help of the benefits specialist. His insurance plan had applied a cost-saving plan (called "Variable Copay") that was essentially a maximizer plan, which was why his copay was coming back so high. They were able to get the plan removed, and he has confirmed that his prescription is processing for the normal $5 through [facility].  Nothing further needed at this time.

## 2023-11-07 ENCOUNTER — Other Ambulatory Visit: Payer: Self-pay | Admitting: Physician Assistant

## 2023-11-07 DIAGNOSIS — M316 Other giant cell arteritis: Secondary | ICD-10-CM

## 2023-11-07 DIAGNOSIS — Z9225 Personal history of immunosupression therapy: Secondary | ICD-10-CM

## 2023-11-07 DIAGNOSIS — Z79899 Other long term (current) drug therapy: Secondary | ICD-10-CM

## 2023-11-07 DIAGNOSIS — Z111 Encounter for screening for respiratory tuberculosis: Secondary | ICD-10-CM

## 2023-11-10 NOTE — Telephone Encounter (Signed)
 Last Fill: 07/24/2023  Labs: 08/07/2023 Monocytes Relative 12.5, Eosinophils Relative 5.9, Alk. Phos. 37, AST 50, ALT 98  TB Gold: 11/06/2022 Neg    Next Visit: 01/21/2024  Last Visit: 06/25/2023   [id number] arteritis   Current Dose per office note 06/25/2023: Actemra  162 mg subcu every 7 days.   Attempted to contact the patient and left message to advise patient he is due to update labs.   Okay to refill Actemra ?

## 2023-11-19 ENCOUNTER — Other Ambulatory Visit: Payer: Self-pay | Admitting: *Deleted

## 2023-11-19 DIAGNOSIS — Z79899 Other long term (current) drug therapy: Secondary | ICD-10-CM

## 2023-11-19 DIAGNOSIS — Z111 Encounter for screening for respiratory tuberculosis: Secondary | ICD-10-CM

## 2023-11-19 DIAGNOSIS — Z9225 Personal history of immunosupression therapy: Secondary | ICD-10-CM

## 2023-11-20 ENCOUNTER — Ambulatory Visit: Payer: Self-pay | Admitting: Physician Assistant

## 2023-11-20 NOTE — Progress Notes (Signed)
 CBC WNL Alk phos is borderline low.   AST and ALT remain elevated but have improved.  Please forward results to GI

## 2023-11-23 LAB — CBC WITH DIFFERENTIAL/PLATELET
Absolute Lymphocytes: 1273 {cells}/uL (ref 850–3900)
Absolute Monocytes: 525 {cells}/uL (ref 200–950)
Basophils Absolute: 38 {cells}/uL (ref 0–200)
Basophils Relative: 0.9 %
Eosinophils Absolute: 336 {cells}/uL (ref 15–500)
Eosinophils Relative: 8 %
HCT: 44.3 % (ref 38.5–50.0)
Hemoglobin: 14.4 g/dL (ref 13.2–17.1)
MCH: 31.9 pg (ref 27.0–33.0)
MCHC: 32.5 g/dL (ref 32.0–36.0)
MCV: 98 fL (ref 80.0–100.0)
MPV: 11.5 fL (ref 7.5–12.5)
Monocytes Relative: 12.5 %
Neutro Abs: 2029 {cells}/uL (ref 1500–7800)
Neutrophils Relative %: 48.3 %
Platelets: 190 10*3/uL (ref 140–400)
RBC: 4.52 10*6/uL (ref 4.20–5.80)
RDW: 12.5 % (ref 11.0–15.0)
Total Lymphocyte: 30.3 %
WBC: 4.2 10*3/uL (ref 3.8–10.8)

## 2023-11-23 LAB — COMPREHENSIVE METABOLIC PANEL WITH GFR
AG Ratio: 2.1 (calc) (ref 1.0–2.5)
ALT: 57 U/L — ABNORMAL HIGH (ref 9–46)
AST: 36 U/L — ABNORMAL HIGH (ref 10–35)
Albumin: 4.7 g/dL (ref 3.6–5.1)
Alkaline phosphatase (APISO): 32 U/L — ABNORMAL LOW (ref 35–144)
BUN: 15 mg/dL (ref 7–25)
CO2: 27 mmol/L (ref 20–32)
Calcium: 9.8 mg/dL (ref 8.6–10.3)
Chloride: 101 mmol/L (ref 98–110)
Creat: 1.12 mg/dL (ref 0.70–1.35)
Globulin: 2.2 g/dL (ref 1.9–3.7)
Glucose, Bld: 91 mg/dL (ref 65–99)
Potassium: 4.4 mmol/L (ref 3.5–5.3)
Sodium: 138 mmol/L (ref 135–146)
Total Bilirubin: 0.7 mg/dL (ref 0.2–1.2)
Total Protein: 6.9 g/dL (ref 6.1–8.1)
eGFR: 75 mL/min/{1.73_m2} (ref >=60)

## 2023-11-23 LAB — QUANTIFERON-TB GOLD PLUS
Mitogen-NIL: 8.58 [IU]/mL
NIL: 0.01 [IU]/mL
QuantiFERON-TB Gold Plus: NEGATIVE
TB1-NIL: 0.01 [IU]/mL
TB2-NIL: 0.01 [IU]/mL

## 2023-11-23 NOTE — Progress Notes (Signed)
 TB gold negative

## 2023-12-10 ENCOUNTER — Ambulatory Visit: Payer: No Typology Code available for payment source | Admitting: Nurse Practitioner

## 2023-12-11 ENCOUNTER — Ambulatory Visit: Payer: No Typology Code available for payment source | Admitting: Rheumatology

## 2024-01-01 ENCOUNTER — Ambulatory Visit: Admitting: Rheumatology

## 2024-01-05 ENCOUNTER — Other Ambulatory Visit: Payer: Self-pay | Admitting: Physician Assistant

## 2024-01-05 DIAGNOSIS — Z79899 Other long term (current) drug therapy: Secondary | ICD-10-CM

## 2024-01-05 DIAGNOSIS — M316 Other giant cell arteritis: Secondary | ICD-10-CM

## 2024-01-05 NOTE — Telephone Encounter (Signed)
 Last Fill: 11/10/2023 (30 day supply)  Labs: 11/19/2023 CBC WNL  Alk phos is borderline low.   AST and ALT remain elevated but have improved.     TB Gold: 11/19/2023 Neg    Next Visit: 01/21/2024  Last Visit: 06/25/2023  IK:Uzfenmjo arteritis   Current Dose per office note 06/25/2023:  Actemra  162 mg subcu every 7 days.   Okay to refill Actemra ?

## 2024-01-07 NOTE — Progress Notes (Signed)
 Office Visit Note  Patient: Stephen Campbell             Date of Birth: 1960/10/16           MRN: [id number]             PCP: Tonita Fallow, MD Referring: Tonita Fallow, MD Visit Date: 01/21/2024 Occupation: @GUAROCC @  Subjective:  Medication management  History of Present Illness: [name] is a 63 y.o. male with temporal arteritis, osteoarthritis and degenerative disc disease.  He states that he continues to have stiffness and discomfort in cervical spine.  He has been going to physical therapy.  He continues to have some numbness in his both hands especially at nighttime.  He did not notice any improvement with carpal tunnel syndrome braces.  He is following up with the spine specialist.  He continues to have some stiffness in his hands, trochanteric region and right knee.  He has infrequent episodes of plantar fasciitis.  He has not had any headaches.  He has been on Actemra  162 mg subcu every 7 days without any interruption.  He denies any muscular weakness or tenderness.    Activities of Daily Living:  Patient reports morning stiffness for 15 minutes.   Patient Reports nocturnal pain.  Difficulty dressing/grooming: Denies Difficulty climbing stairs: Denies Difficulty getting out of chair: Denies Difficulty using hands for taps, buttons, cutlery, and/or writing: Denies  Review of Systems  Constitutional:  Negative for fatigue.  HENT:  Positive for mouth sores. Negative for mouth dryness.   Eyes:  Negative for dryness.  Respiratory:  Negative for shortness of breath.   Cardiovascular:  Negative for chest pain and palpitations.  Gastrointestinal:  Negative for blood in stool, constipation and diarrhea.  Endocrine: Negative for increased urination.  Genitourinary:  Negative for involuntary urination.  Musculoskeletal:  Positive for joint pain, joint pain, myalgias, muscle weakness, morning stiffness and myalgias. Negative for gait problem, joint swelling and muscle  tenderness.  Skin:  Negative for color change, rash, hair loss and sensitivity to sunlight.  Allergic/Immunologic: Negative for susceptible to infections.  Neurological:  Negative for dizziness and headaches.  Hematological:  Negative for swollen glands.  Psychiatric/Behavioral:  Negative for depressed mood and sleep disturbance. The patient is nervous/anxious.     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   Hypertension    Hyperlipidemia    Allergy    Asthma    Depression    GERD (gastroesophageal reflux disease)    ED (erectile dysfunction)    Plantar fasciitis 01/06/2012    Past Medical History:  Diagnosis Date   Allergy    Anxiety    Asthma    Basal cell carcinoma    Depression    ED (erectile dysfunction)    GERD (gastroesophageal reflux disease)    Hyperlipidemia    Hypertension    Hypogonadism in male    Osteoarthritis    Temporal arteritis (HCC)    Vitamin D  deficiency     Family History  Problem Relation Age of Onset   Uterine  cancer Mother    Asthma Father    Kidney disease Father    Rheum arthritis Father    Aortic stenosis Father        valve replacement   Ankylosing spondylitis Brother    Heart disease Brother    Cancer Paternal Grandmother    Heart attack Paternal Grandfather    Colon cancer Neg Hx    Colon polyps Neg Hx    Gallbladder disease Neg Hx    Esophageal cancer Neg Hx    Diabetes Neg Hx    Past Surgical History:  Procedure Laterality Date   HERNIA REPAIR Bilateral 1970   63 yrs old,  inguinal    LASIK     ORTHOPEDIC SURGERY Left    left shoulder, bone spur, Dr. Deneise   PTOSIS REPAIR Bilateral 11/2020   Dr. Melba - [facility]   scalp biopsy     basal cell    VASECTOMY  2013   Social History   Social History Narrative   Not on file   Immunization History  Administered Date(s) Administered   Influenza Inj Mdck Quad Pf 04/24/2017   Influenza Inj Mdck Quad With Preservative 02/26/2018, 05/04/2020   Influenza Split 03/28/2013, 03/30/2014   Influenza, Seasonal, Injecte, Preservative Fre 05/04/2015   Influenza,inj,Quad PF,6+ Mos 05/30/2022   Influenza-Unspecified 03/17/2023   PFIZER Comirnaty(Gray Top)Covid-19 Tri-Sucrose Vaccine 03/21/2021   PFIZER(Purple Top)SARS-COV-2 Vaccination 06/24/2019, 07/15/2019, 06/10/2020   Tdap 02/20/2017   Unspecified SARS-COV-2 Vaccination 03/17/2023   Zoster Recombinant(Shingrix ) 03/17/2023, 09/10/2023     Objective: Vital Signs: BP (!) 166/94 (BP Location: Left Arm, Patient Position: Sitting, Cuff Size: Normal)   Pulse 80   Resp 16   Ht 6' (1.829 m)   Wt 179 lb 6.4 oz (81.4 kg)   BMI 24.33 kg/m    Physical Exam Vitals and nursing note reviewed.  Constitutional:      Appearance: He is well-developed.  HENT:     Head: Normocephalic and atraumatic.  Eyes:     Conjunctiva/sclera: Conjunctivae normal.     Pupils: Pupils are equal, round, and reactive to light.  Cardiovascular:     Rate and Rhythm: Normal rate and regular rhythm.     Heart sounds: Normal heart sounds.  Pulmonary:     Effort: Pulmonary effort is normal.     Breath sounds: Normal breath sounds.  Abdominal:     General: Bowel sounds are normal.     Palpations: Abdomen is soft.  Musculoskeletal:     Cervical back: Normal range of motion and neck supple.  Skin:    General: Skin is warm and dry.     Capillary Refill: Capillary refill takes less than 2 seconds.  Neurological:     Mental Status: He is alert and oriented to person, place, and  time.  Psychiatric:        Behavior: Behavior normal.      Musculoskeletal Exam: He had limited lateral rotation cervical spine especially with the left lateral rotation.  There was no tenderness over thoracic or lumbar spine.  Shoulders, elbows, wrist joints were in good range of motion.  MCPs PIPs and DIPs Juengel range of motion with no synovitis.  Hip joints and knee joints in good range of motion.  He has some tenderness over right trochanteric bursa and right gluteal region.  There was no tenderness over ankles or MTPs.  CDAI Exam: CDAI Score: -- Patient Global: --; Provider Global: -- Swollen: --; Tender: -- Joint Exam 01/21/2024  No joint exam has been documented for this visit   There is currently no information documented on the homunculus. Go to the Rheumatology activity and complete the homunculus joint exam.  Investigation: No additional findings.  Imaging: No results found.  Recent Labs: Lab Results  Component Value Date   WBC 4.2 11/19/2023   HGB 14.4 11/19/2023   PLT 190 11/19/2023   NA 138 11/19/2023   K 4.4 11/19/2023   CL 101 11/19/2023   CO2 27 11/19/2023   GLUCOSE 91 11/19/2023   BUN 15 11/19/2023   CREATININE 1.12 11/19/2023   BILITOT 0.7 11/19/2023   ALKPHOS 37 (L) 08/07/2023   AST 36 (H) 11/19/2023   ALT 57 (H) 11/19/2023   PROT 6.9 11/19/2023   ALBUMIN 4.8 08/07/2023   CALCIUM  9.8 11/19/2023   GFRAA 101 11/16/2020   QFTBGOLDPLUS NEGATIVE 11/19/2023    Speciality Comments: Actemra  started 08/15/21  Procedures:  No procedures performed Allergies: Enalapril    Assessment / Plan:     Visit Diagnoses: Temporal arteritis (HCC)-he had no recurrence of temporal arteritis.  He had no tenderness on palpation over temporal region.  He denies any muscular weakness or tenderness.  He has been off prednisone  since October 2023.  He continues to be on Actemra  162 mg subcu every 7 days.  High risk medication use - Actemra  162 mg subcu every 14 days.  He  has been off prednisone  since April 03, 2022.  November 19, 2023 CBC and CMP were normal except elevated LFTs.  LFTs have been stable.  TB Gold was negative on November 19, 2023.  Information reimmunization was placed in the AVS.  Elevated LFTs-patient was evaluated by GI and was told that the elevated LFTs are related to fatty liver.  He had intentional weight loss.  Psoriasis-he uses topical agents on as needed basis.  HLA B27 positive  Primary osteoarthritis of both hands-he continues to have some stiffness in his hands.  No synovitis was noted.  Paresthesia of both hands - Nerve conduction velocities in the past showed mild carpal tunnel syndrome.  He believes the paresthesias are coming from the cervical spine.  He did not notice much improvement after wearing carpal tunnel braces.  Trochanteric bursitis of both hips-he had some tenderness over right trochanteric region.  IT band stretches were advised.  Chondromalacia patellae, right knee-he has off-and-on discomfort.  Lower extremity muscle strengthening signs were discussed.  Primary osteoarthritis of both feet-currently asymptomatic.  Plantar fasciitis-he has intermittent discomfort.  Bilateral calcaneal spurs  DDD (degenerative disc disease), cervical -he has been going to physical therapy and is followed by neurosurgery.  MRI done previously showed moderate spinal canal stenosis and degenerative changes.following up with neurosurgeon.recent injections.may need to cervical fusion.  Degeneration of intervertebral disc of lumbar region without discogenic back pain or lower extremity pain-chronic lower back pain.  Vitamin D  deficiency -vitamin D  was low.  He has been taking vitamin D  supplement.  I will check vitamin D  level next month with his next labs.  Plan: VITAMIN D  25 Hydroxy (Vit-D Deficiency, Fractures)  History of hypertension-blood pressure was elevated at 166/94.  He was advised to monitor blood pressure closely and follow-up  with his PCP.  The microamps.  As follows:  History of hypercholesterolemia  History of gastroesophageal reflux (GERD)  History of asthma  History of basal cell carcinoma  History of anxiety  Orders: Orders Placed This Encounter  Procedures   VITAMIN D  25 Hydroxy (Vit-D Deficiency, Fractures)   No  orders of the defined types were placed in this encounter.    Follow-Up Instructions: Return in about 5 months (around 06/22/2024) for Temporal arteritis.   Maya Nash, MD  Note - This record has been created using Animal nutritionist.  Chart creation errors have been sought, but may not always  have been located. Such creation errors do not reflect on  the standard of medical care.

## 2024-01-21 ENCOUNTER — Encounter: Payer: Self-pay | Admitting: Rheumatology

## 2024-01-21 ENCOUNTER — Ambulatory Visit: Attending: Rheumatology | Admitting: Rheumatology

## 2024-01-21 VITALS — BP 166/94 | HR 80 | Resp 16 | Ht 72.0 in | Wt 179.4 lb

## 2024-01-21 DIAGNOSIS — M7062 Trochanteric bursitis, left hip: Secondary | ICD-10-CM

## 2024-01-21 DIAGNOSIS — Z8709 Personal history of other diseases of the respiratory system: Secondary | ICD-10-CM

## 2024-01-21 DIAGNOSIS — M7731 Calcaneal spur, right foot: Secondary | ICD-10-CM

## 2024-01-21 DIAGNOSIS — R7989 Other specified abnormal findings of blood chemistry: Secondary | ICD-10-CM

## 2024-01-21 DIAGNOSIS — M2241 Chondromalacia patellae, right knee: Secondary | ICD-10-CM

## 2024-01-21 DIAGNOSIS — L409 Psoriasis, unspecified: Secondary | ICD-10-CM | POA: Diagnosis not present

## 2024-01-21 DIAGNOSIS — Z8639 Personal history of other endocrine, nutritional and metabolic disease: Secondary | ICD-10-CM

## 2024-01-21 DIAGNOSIS — E559 Vitamin D deficiency, unspecified: Secondary | ICD-10-CM

## 2024-01-21 DIAGNOSIS — Z79899 Other long term (current) drug therapy: Secondary | ICD-10-CM

## 2024-01-21 DIAGNOSIS — Z85828 Personal history of other malignant neoplasm of skin: Secondary | ICD-10-CM

## 2024-01-21 DIAGNOSIS — M722 Plantar fascial fibromatosis: Secondary | ICD-10-CM

## 2024-01-21 DIAGNOSIS — M316 Other giant cell arteritis: Secondary | ICD-10-CM | POA: Diagnosis not present

## 2024-01-21 DIAGNOSIS — M51369 Other intervertebral disc degeneration, lumbar region without mention of lumbar back pain or lower extremity pain: Secondary | ICD-10-CM

## 2024-01-21 DIAGNOSIS — Z8719 Personal history of other diseases of the digestive system: Secondary | ICD-10-CM

## 2024-01-21 DIAGNOSIS — M7061 Trochanteric bursitis, right hip: Secondary | ICD-10-CM

## 2024-01-21 DIAGNOSIS — Z8679 Personal history of other diseases of the circulatory system: Secondary | ICD-10-CM

## 2024-01-21 DIAGNOSIS — M19041 Primary osteoarthritis, right hand: Secondary | ICD-10-CM

## 2024-01-21 DIAGNOSIS — Z8659 Personal history of other mental and behavioral disorders: Secondary | ICD-10-CM

## 2024-01-21 DIAGNOSIS — Z1589 Genetic susceptibility to other disease: Secondary | ICD-10-CM

## 2024-01-21 DIAGNOSIS — M19071 Primary osteoarthritis, right ankle and foot: Secondary | ICD-10-CM

## 2024-01-21 DIAGNOSIS — M7732 Calcaneal spur, left foot: Secondary | ICD-10-CM

## 2024-01-21 DIAGNOSIS — M19042 Primary osteoarthritis, left hand: Secondary | ICD-10-CM

## 2024-01-21 DIAGNOSIS — R202 Paresthesia of skin: Secondary | ICD-10-CM

## 2024-01-21 DIAGNOSIS — M19072 Primary osteoarthritis, left ankle and foot: Secondary | ICD-10-CM

## 2024-01-21 DIAGNOSIS — M5030 Other cervical disc degeneration, unspecified cervical region: Secondary | ICD-10-CM

## 2024-01-21 NOTE — Patient Instructions (Signed)
 Standing Labs We placed an order today for your standing lab work.   Please have your standing labs drawn in September and every 3 months  Please have your labs drawn 2 weeks prior to your appointment so that the provider can discuss your lab results at your appointment, if possible.  Please note that you may see your imaging and lab results in MyChart before we have reviewed them. We will contact you once all results are reviewed. Please allow our office up to 72 hours to thoroughly review all of the results before contacting the office for clarification of your results.  WALK-IN LAB HOURS  Monday through Thursday from 8:00 am -12:30 pm and 1:00 pm-4:30 pm and Friday from 8:00 am-12:00 pm.  Patients with office visits requiring labs will be seen before walk-in labs.  You may encounter longer than normal wait times. Please allow additional time. Wait times may be shorter on  Monday and Thursday afternoons.  We do not book appointments for walk-in labs. We appreciate your patience and understanding with our staff.   Labs are drawn by [facility]. Please bring your co-pay at the time of your lab draw.  You may receive a bill from [facility] for your lab work.  Please note if you are on Hydroxychloroquine  and and an order has been placed for a Hydroxychloroquine  level,  you will need to have it drawn 4 hours or more after your last dose.  If you wish to have your labs drawn at another location, please call the office 24 hours in advance so we can fax the orders.  The office is located at [address], [address], [place], [place] [address]   If you have any questions regarding directions or hours of operation,  please call [phone].   As a reminder, please drink plenty of water prior to coming for your lab work. Thanks!   Vaccines You are taking a medication(s) that can suppress your immune system.  The following immunizations are recommended: Flu annually Covid-19  Td/Tdap (tetanus,  diphtheria, pertussis) every 10 years Pneumonia (Prevnar 15 then Pneumovax 23 at least 1 year apart.  Alternatively, can take Prevnar 20 without needing additional dose) Shingrix: 2 doses from 4 weeks to 6 months apart  Please check with your PCP to make sure you are up to date.

## 2024-03-01 ENCOUNTER — Other Ambulatory Visit: Payer: Self-pay | Admitting: Nurse Practitioner

## 2024-03-01 DIAGNOSIS — I10 Essential (primary) hypertension: Secondary | ICD-10-CM

## 2024-04-11 ENCOUNTER — Other Ambulatory Visit (HOSPITAL_COMMUNITY): Payer: Self-pay

## 2024-04-11 ENCOUNTER — Telehealth: Payer: Self-pay

## 2024-04-11 NOTE — Telephone Encounter (Signed)
 Received notification from [facility] regarding a prior authorization for ACTEMRA  SQ. Authorization has been APPROVED from 04/11/2024 to 10/10/2024. Approval letter sent to scan center.  Authorization # [id number]  Sherry Pennant, PharmD, MPH, BCPS, CPP Clinical Pharmacist [facility])

## 2024-04-11 NOTE — Telephone Encounter (Signed)
 Received a fax from [employer] stating that a new PA is required.  Submitted a Prior Authorization request to [facility] for ACTEMRA  SQ via CoverMyMeds. Will update once we receive a response.  Key: [id number]

## 2024-06-01 ENCOUNTER — Encounter: Payer: Self-pay | Admitting: Rheumatology

## 2024-06-01 NOTE — Telephone Encounter (Signed)
 Please schedule an office visit appointment for evaluation and plantar fascia injection.

## 2024-06-02 NOTE — Progress Notes (Unsigned)
 Office Visit Note  Patient: Stephen Campbell             Date of Birth: Dec 18, 1960           MRN: [id number]             PCP: Tonita Fallow, MD (Inactive) Referring: No ref. provider found Visit Date: 06/07/2024 Occupation: Data Unavailable  Subjective:  No chief complaint on file.   History of Present Illness: Stephen Campbell is a 63 y.o. male ***     Activities of Daily Living:  Patient reports morning stiffness for *** {minute/hour:19697}.   Patient {ACTIONS;DENIES/REPORTS:21021675::Denies} nocturnal pain.  Difficulty dressing/grooming: {ACTIONS;DENIES/REPORTS:21021675::Denies} Difficulty climbing stairs: {ACTIONS;DENIES/REPORTS:21021675::Denies} Difficulty getting out of chair: {ACTIONS;DENIES/REPORTS:21021675::Denies} Difficulty using hands for taps, buttons, cutlery, and/or writing: {ACTIONS;DENIES/REPORTS:21021675::Denies}  No Rheumatology ROS completed.   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   Hypertension    Hyperlipidemia    Allergy    Asthma    Depression    GERD (gastroesophageal reflux disease)    ED (erectile dysfunction)    Plantar fasciitis 01/06/2012    Past Medical History:  Diagnosis Date   Allergy    Anxiety    Asthma    Basal cell carcinoma    Depression    ED (erectile dysfunction)    GERD (gastroesophageal reflux disease)    Hyperlipidemia    Hypertension    Hypogonadism in male    Osteoarthritis    Temporal arteritis (HCC)    Vitamin D  deficiency     Family History  Problem  Relation Age of Onset   Uterine cancer Mother    Asthma Father    Kidney disease Father    Rheum arthritis Father    Aortic stenosis Father        valve replacement   Ankylosing spondylitis Brother    Heart disease Brother    Cancer Paternal Grandmother    Heart attack Paternal Grandfather    Colon cancer Neg Hx    Colon polyps Neg Hx    Gallbladder disease Neg Hx    Esophageal cancer Neg Hx    Diabetes Neg Hx    Past Surgical History:  Procedure Laterality Date   HERNIA REPAIR Bilateral 1970   63 yrs old, inguinal    LASIK     ORTHOPEDIC SURGERY Left    left shoulder, bone spur, Dr. Deneise   PTOSIS REPAIR Bilateral 11/2020   Dr. Melba - [facility]   scalp biopsy     basal cell    VASECTOMY  2013   Social History[1] Social History   Social History Narrative   Not on file     Immunization History  Administered Date(s) Administered   Influenza Inj Mdck Quad Pf 04/24/2017   Influenza Inj Mdck Quad With Preservative 02/26/2018, 05/04/2020   Influenza Split 03/28/2013, 03/30/2014   Influenza, Seasonal, Injecte, Preservative Fre 05/04/2015   Influenza,inj,Quad PF,6+ Mos 05/30/2022   Influenza-Unspecified 03/17/2023   PFIZER Comirnaty(Gray Top)Covid-19 Tri-Sucrose Vaccine 03/21/2021   PFIZER(Purple Top)SARS-COV-2 Vaccination 06/24/2019, 07/15/2019, 06/10/2020   Tdap  02/20/2017   Unspecified SARS-COV-2 Vaccination 03/17/2023   Zoster Recombinant(Shingrix ) 03/17/2023, 09/10/2023     Objective: Vital Signs: There were no vitals taken for this visit.   Physical Exam   Musculoskeletal Exam: ***  CDAI Exam: CDAI Score: -- Patient Global: --; Provider Global: -- Swollen: --; Tender: -- Joint Exam 06/07/2024   No joint exam has been documented for this visit   There is currently no information documented on the homunculus. Go to the Rheumatology activity and complete the homunculus joint exam.  Investigation: No additional findings.  Imaging: No  results found.  Recent Labs: Lab Results  Component Value Date   WBC 4.2 11/19/2023   HGB 14.4 11/19/2023   PLT 190 11/19/2023   NA 138 11/19/2023   K 4.4 11/19/2023   CL 101 11/19/2023   CO2 27 11/19/2023   GLUCOSE 91 11/19/2023   BUN 15 11/19/2023   CREATININE 1.12 11/19/2023   BILITOT 0.7 11/19/2023   ALKPHOS 37 (L) 08/07/2023   AST 36 (H) 11/19/2023   ALT 57 (H) 11/19/2023   PROT 6.9 11/19/2023   ALBUMIN 4.8 08/07/2023   CALCIUM  9.8 11/19/2023   GFRAA 101 11/16/2020   QFTBGOLDPLUS NEGATIVE 11/19/2023    Speciality Comments: Actemra  started 08/15/21  Procedures:  No procedures performed Allergies: Enalapril    Assessment / Plan:     Visit Diagnoses: No diagnosis found.  Orders: No orders of the defined types were placed in this encounter.  No orders of the defined types were placed in this encounter.   Face-to-face time spent with patient was *** minutes. Greater than 50% of time was spent in counseling and coordination of care.  Follow-Up Instructions: No follow-ups on file.   Daved JAYSON Gavel, CMA  Note - This record has been created using Animal nutritionist.  Chart creation errors have been sought, but may not always  have been located. Such creation errors do not reflect on  the standard of medical care.    [1]  Social History Tobacco Use   Smoking status: Former    Types: Cigars    Passive exposure: Never   Smokeless tobacco: Never   Tobacco comments:    smokes cigar rarely, every few years  Vaping Use   Vaping status: Never Used  Substance Use Topics   Alcohol use: Yes    Alcohol/week: 2.0 standard drinks of alcohol    Types: 2 Glasses of wine per week   Drug use: No

## 2024-06-06 ENCOUNTER — Other Ambulatory Visit: Payer: Self-pay | Admitting: Physician Assistant

## 2024-06-06 DIAGNOSIS — Z79899 Other long term (current) drug therapy: Secondary | ICD-10-CM

## 2024-06-06 DIAGNOSIS — M316 Other giant cell arteritis: Secondary | ICD-10-CM

## 2024-06-07 ENCOUNTER — Encounter: Payer: Self-pay | Admitting: Rheumatology

## 2024-06-07 ENCOUNTER — Ambulatory Visit: Payer: PRIVATE HEALTH INSURANCE | Attending: Rheumatology | Admitting: Rheumatology

## 2024-06-07 VITALS — BP 145/97 | HR 73 | Temp 98.3°F | Resp 17 | Ht 72.0 in | Wt 182.0 lb

## 2024-06-07 DIAGNOSIS — M19041 Primary osteoarthritis, right hand: Secondary | ICD-10-CM

## 2024-06-07 DIAGNOSIS — M19071 Primary osteoarthritis, right ankle and foot: Secondary | ICD-10-CM | POA: Diagnosis not present

## 2024-06-07 DIAGNOSIS — Z79899 Other long term (current) drug therapy: Secondary | ICD-10-CM

## 2024-06-07 DIAGNOSIS — Z85828 Personal history of other malignant neoplasm of skin: Secondary | ICD-10-CM

## 2024-06-07 DIAGNOSIS — M2241 Chondromalacia patellae, right knee: Secondary | ICD-10-CM

## 2024-06-07 DIAGNOSIS — M19072 Primary osteoarthritis, left ankle and foot: Secondary | ICD-10-CM

## 2024-06-07 DIAGNOSIS — M7732 Calcaneal spur, left foot: Secondary | ICD-10-CM

## 2024-06-07 DIAGNOSIS — Z1589 Genetic susceptibility to other disease: Secondary | ICD-10-CM

## 2024-06-07 DIAGNOSIS — M316 Other giant cell arteritis: Secondary | ICD-10-CM

## 2024-06-07 DIAGNOSIS — L409 Psoriasis, unspecified: Secondary | ICD-10-CM | POA: Diagnosis not present

## 2024-06-07 DIAGNOSIS — Z8719 Personal history of other diseases of the digestive system: Secondary | ICD-10-CM

## 2024-06-07 DIAGNOSIS — Z8679 Personal history of other diseases of the circulatory system: Secondary | ICD-10-CM

## 2024-06-07 DIAGNOSIS — R7989 Other specified abnormal findings of blood chemistry: Secondary | ICD-10-CM | POA: Diagnosis not present

## 2024-06-07 DIAGNOSIS — M51369 Other intervertebral disc degeneration, lumbar region without mention of lumbar back pain or lower extremity pain: Secondary | ICD-10-CM

## 2024-06-07 DIAGNOSIS — M7731 Calcaneal spur, right foot: Secondary | ICD-10-CM

## 2024-06-07 DIAGNOSIS — M722 Plantar fascial fibromatosis: Secondary | ICD-10-CM

## 2024-06-07 DIAGNOSIS — M7061 Trochanteric bursitis, right hip: Secondary | ICD-10-CM | POA: Diagnosis not present

## 2024-06-07 DIAGNOSIS — M7062 Trochanteric bursitis, left hip: Secondary | ICD-10-CM

## 2024-06-07 DIAGNOSIS — Z8639 Personal history of other endocrine, nutritional and metabolic disease: Secondary | ICD-10-CM

## 2024-06-07 DIAGNOSIS — M5030 Other cervical disc degeneration, unspecified cervical region: Secondary | ICD-10-CM

## 2024-06-07 DIAGNOSIS — M19042 Primary osteoarthritis, left hand: Secondary | ICD-10-CM

## 2024-06-07 DIAGNOSIS — Z8659 Personal history of other mental and behavioral disorders: Secondary | ICD-10-CM

## 2024-06-07 DIAGNOSIS — Z8709 Personal history of other diseases of the respiratory system: Secondary | ICD-10-CM

## 2024-06-07 DIAGNOSIS — R202 Paresthesia of skin: Secondary | ICD-10-CM

## 2024-06-07 DIAGNOSIS — E559 Vitamin D deficiency, unspecified: Secondary | ICD-10-CM

## 2024-06-07 MED ORDER — LIDOCAINE HCL 1 % IJ SOLN
1.0000 mL | INTRAMUSCULAR | Status: AC | PRN
  Administered 2024-06-07: 1 mL

## 2024-06-07 MED ORDER — TRIAMCINOLONE ACETONIDE 40 MG/ML IJ SUSP
30.0000 mg | INTRAMUSCULAR | Status: AC | PRN
  Administered 2024-06-07: 30 mg

## 2024-06-07 MED ORDER — ACTEMRA ACTPEN 162 MG/0.9ML ~~LOC~~ SOAJ: SUBCUTANEOUS | 2 refills | Status: AC

## 2024-06-07 NOTE — Patient Instructions (Signed)
 Standing Labs We placed an order today for your standing lab work.   Please have your standing labs drawn in March and every 3 months  Please have your labs drawn 2 weeks prior to your appointment so that the provider can discuss your lab results at your appointment, if possible.  Please note that you may see your imaging and lab results in MyChart before we have reviewed them. We will contact you once all results are reviewed. Please allow our office up to 72 hours to thoroughly review all of the results before contacting the office for clarification of your results.  WALK-IN LAB HOURS  Monday through Thursday from 8:00 am - 4:30 pm and Friday from 8:00 am-12:00 pm.  Patients with office visits requiring labs will be seen before walk-in labs.  You may encounter longer than normal wait times. Please allow additional time. Wait times may be shorter on  Monday and Thursday afternoons.  We do not book appointments for walk-in labs. We appreciate your patience and understanding with our staff.   Labs are drawn by [facility]. Please bring your co-pay at the time of your lab draw.  You may receive a bill from [facility] for your lab work.  Please note if you are on Hydroxychloroquine and and an order has been placed for a Hydroxychloroquine level,  you will need to have it drawn 4 hours or more after your last dose.  If you wish to have your labs drawn at another location, please call the office 24 hours in advance so we can fax the orders.  The office is located at [address], [address], [place], [place] [address]   If you have any questions regarding directions or hours of operation,  please call [phone].   As a reminder, please drink plenty of water prior to coming for your lab work. Thanks!   Vaccines You are taking a medication(s) that can suppress your immune system.  The following immunizations are recommended: Flu annually Covid-19  RSV Td/Tdap (tetanus, diphtheria, pertussis)  every 10 years Pneumonia (Prevnar 15 then Pneumovax 23 at least 1 year apart.  Alternatively, can take Prevnar 20 without needing additional dose) Shingrix: 2 doses from 4 weeks to 6 months apart  Please check with your PCP to make sure you are up to date.   If you have signs or symptoms of an infection or start antibiotics: First, call your PCP for workup of your infection. Hold your medication through the infection, until you complete your antibiotics, and until symptoms resolve if you take the following: Injectable medication (Actemra, Benlysta, Cimzia, Cosentyx, Enbrel, Humira, Kevzara, Orencia, Remicade, Simponi, Stelara, Taltz, Tremfya) Methotrexate Leflunomide (Arava) Mycophenolate (Cellcept) Earma, Olumiant, or Rinvoq

## 2024-06-14 ENCOUNTER — Encounter: Payer: No Typology Code available for payment source | Admitting: Nurse Practitioner

## 2024-06-29 ENCOUNTER — Ambulatory Visit: Payer: PRIVATE HEALTH INSURANCE | Admitting: Rheumatology

## 2024-07-19 ENCOUNTER — Telehealth: Payer: Self-pay

## 2024-07-19 NOTE — Telephone Encounter (Signed)
 [employer] called to inform our office that patients insurance termed last month.

## 2024-07-20 ENCOUNTER — Other Ambulatory Visit (HOSPITAL_COMMUNITY): Payer: Self-pay

## 2024-12-01 ENCOUNTER — Ambulatory Visit: Payer: PRIVATE HEALTH INSURANCE | Admitting: Rheumatology
